# Patient Record
Sex: Female | Born: 1940 | Hispanic: No | Smoking: Never smoker
Health system: Southern US, Community
[De-identification: ages and names within clinical notes are randomized; demographics above are authoritative.]

## PROBLEM LIST (undated history)

## (undated) DIAGNOSIS — K219 Gastro-esophageal reflux disease without esophagitis: Secondary | ICD-10-CM

## (undated) DIAGNOSIS — M1990 Unspecified osteoarthritis, unspecified site: Secondary | ICD-10-CM

## (undated) DIAGNOSIS — K269 Duodenal ulcer, unspecified as acute or chronic, without hemorrhage or perforation: Secondary | ICD-10-CM

## (undated) DIAGNOSIS — N3281 Overactive bladder: Secondary | ICD-10-CM

## (undated) DIAGNOSIS — M7632 Iliotibial band syndrome, left leg: Secondary | ICD-10-CM

## (undated) DIAGNOSIS — J4540 Moderate persistent asthma, uncomplicated: Secondary | ICD-10-CM

## (undated) DIAGNOSIS — N183 Chronic kidney disease, stage 3 unspecified: Secondary | ICD-10-CM

## (undated) DIAGNOSIS — J45909 Unspecified asthma, uncomplicated: Secondary | ICD-10-CM

## (undated) DIAGNOSIS — G43909 Migraine, unspecified, not intractable, without status migrainosus: Secondary | ICD-10-CM

## (undated) DIAGNOSIS — Z9289 Personal history of other medical treatment: Secondary | ICD-10-CM

## (undated) DIAGNOSIS — E785 Hyperlipidemia, unspecified: Secondary | ICD-10-CM

## (undated) DIAGNOSIS — M25562 Pain in left knee: Secondary | ICD-10-CM

## (undated) DIAGNOSIS — J3089 Other allergic rhinitis: Secondary | ICD-10-CM

## (undated) DIAGNOSIS — J309 Allergic rhinitis, unspecified: Secondary | ICD-10-CM

## (undated) HISTORY — PX: JOINT REPLACEMENT: SHX530

## (undated) HISTORY — PX: REPLACEMENT TOTAL KNEE: SUR1224

## (undated) HISTORY — PX: BREAST BIOPSY: SHX20

## (undated) HISTORY — DX: Chronic kidney disease, stage 3 unspecified: N18.30

## (undated) HISTORY — PX: TONSILLECTOMY AND ADENOIDECTOMY: SUR1326

---
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
## 2001-06-26 HISTORY — PX: CATARACT EXTRACTION W/ INTRAOCULAR LENS  IMPLANT, BILATERAL: SHX1307

## 2003-09-25 HISTORY — PX: DILATION AND CURETTAGE OF UTERUS: SHX78

## 2013-06-26 HISTORY — PX: CHEST TUBE INSERTION: SHX231

## 2013-07-13 HISTORY — PX: VIDEO ASSISTED THORACOSCOPY (VATS)/THOROCOTOMY: SHX6173

## 2014-09-23 DIAGNOSIS — G43909 Migraine, unspecified, not intractable, without status migrainosus: Secondary | ICD-10-CM | POA: Diagnosis not present

## 2014-09-23 DIAGNOSIS — K219 Gastro-esophageal reflux disease without esophagitis: Secondary | ICD-10-CM | POA: Diagnosis not present

## 2014-09-23 DIAGNOSIS — N183 Chronic kidney disease, stage 3 (moderate): Secondary | ICD-10-CM | POA: Diagnosis not present

## 2014-09-23 DIAGNOSIS — J45909 Unspecified asthma, uncomplicated: Secondary | ICD-10-CM | POA: Diagnosis not present

## 2014-09-23 DIAGNOSIS — E785 Hyperlipidemia, unspecified: Secondary | ICD-10-CM | POA: Diagnosis not present

## 2014-09-29 DIAGNOSIS — G43909 Migraine, unspecified, not intractable, without status migrainosus: Secondary | ICD-10-CM | POA: Diagnosis not present

## 2014-10-01 DIAGNOSIS — M25562 Pain in left knee: Secondary | ICD-10-CM

## 2014-10-01 DIAGNOSIS — M179 Osteoarthritis of knee, unspecified: Secondary | ICD-10-CM | POA: Diagnosis not present

## 2014-10-01 DIAGNOSIS — M1712 Unilateral primary osteoarthritis, left knee: Secondary | ICD-10-CM | POA: Diagnosis not present

## 2014-10-01 HISTORY — DX: Pain in left knee: M25.562

## 2014-11-10 DIAGNOSIS — Z1231 Encounter for screening mammogram for malignant neoplasm of breast: Secondary | ICD-10-CM | POA: Diagnosis not present

## 2014-12-03 DIAGNOSIS — M1712 Unilateral primary osteoarthritis, left knee: Secondary | ICD-10-CM

## 2014-12-03 HISTORY — DX: Unilateral primary osteoarthritis, left knee: M17.12

## 2014-12-08 ENCOUNTER — Other Ambulatory Visit: Payer: Self-pay | Admitting: Orthopedic Surgery

## 2014-12-08 DIAGNOSIS — Z01818 Encounter for other preprocedural examination: Secondary | ICD-10-CM

## 2014-12-10 ENCOUNTER — Ambulatory Visit
Admission: RE | Admit: 2014-12-10 | Discharge: 2014-12-10 | Disposition: A | Discharge status: Home / Self Care | Payer: Medicare Other | Source: Ambulatory Visit | Attending: Orthopedic Surgery | Admitting: Orthopedic Surgery

## 2014-12-10 DIAGNOSIS — Z01818 Encounter for other preprocedural examination: Secondary | ICD-10-CM | POA: Diagnosis not present

## 2014-12-10 DIAGNOSIS — M1712 Unilateral primary osteoarthritis, left knee: Secondary | ICD-10-CM | POA: Diagnosis not present

## 2014-12-21 ENCOUNTER — Other Ambulatory Visit: Payer: Self-pay | Admitting: Orthopedic Surgery

## 2014-12-31 NOTE — Pre-Procedure Instructions (Signed)
Merita NortonGail D Borg  12/31/2014     Your procedure is scheduled on: Monday January 11, 2015 at 8:45 AM.  Report to [facility] Admitting at 6:45 A.M.  Call this number if you have problems the morning of surgery: [phone]    Remember:  Do not eat food or drink liquids after midnight.  Take these medicines the morning of surgery with A SIP OF WATER : Pulmicort inhaler, Flonase nasal spray, Loratadine (Claritin), Omeprazole (Prilosec), Propranolol (Inderal), and Solifenacin (Vesicare)   Please stop taking any vitamins, herbal medications, Ibuprofen, Advil, Motrin, Black Cohosh, etc on Monday July 11th   Do not wear jewelry, make-up or nail polish.  Do not wear lotions, powders, or perfumes.    Do not shave 48 hours prior to surgery.    Do not bring valuables to the hospital.  [facility] is not responsible for any belongings or valuables.  Contacts, dentures or bridgework may not be worn into surgery.  Leave your suitcase in the car.  After surgery it may be brought to your room.  For patients admitted to the hospital, discharge time will be determined by your treatment team.  Patients discharged the day of surgery will not be allowed to drive home.   Name and phone number of your driver:    Special instructions:  Shower using CHG soap the night before and the morning of your surgery  Please read over the following fact sheets that you were given. Pain Booklet, Coughing and Deep Breathing, Total Joint Packet, MRSA Information and Surgical Site Infection Prevention

## 2015-01-01 ENCOUNTER — Encounter (HOSPITAL_COMMUNITY)
Admission: RE | Admit: 2015-01-01 | Discharge: 2015-01-01 | Disposition: A | Discharge status: Home / Self Care | Payer: Medicare Other | Source: Ambulatory Visit | Attending: Orthopedic Surgery | Admitting: Orthopedic Surgery

## 2015-01-01 ENCOUNTER — Encounter (HOSPITAL_COMMUNITY): Payer: Self-pay

## 2015-01-01 DIAGNOSIS — Z01818 Encounter for other preprocedural examination: Secondary | ICD-10-CM

## 2015-01-01 DIAGNOSIS — J45909 Unspecified asthma, uncomplicated: Secondary | ICD-10-CM | POA: Insufficient documentation

## 2015-01-01 DIAGNOSIS — R001 Bradycardia, unspecified: Secondary | ICD-10-CM | POA: Insufficient documentation

## 2015-01-01 DIAGNOSIS — Z01812 Encounter for preprocedural laboratory examination: Secondary | ICD-10-CM | POA: Insufficient documentation

## 2015-01-01 DIAGNOSIS — K219 Gastro-esophageal reflux disease without esophagitis: Secondary | ICD-10-CM | POA: Diagnosis not present

## 2015-01-01 DIAGNOSIS — M1712 Unilateral primary osteoarthritis, left knee: Secondary | ICD-10-CM | POA: Diagnosis not present

## 2015-01-01 HISTORY — DX: Gastro-esophageal reflux disease without esophagitis: K21.9

## 2015-01-01 HISTORY — DX: Overactive bladder: N32.81

## 2015-01-01 HISTORY — DX: Other allergic rhinitis: J30.89

## 2015-01-01 HISTORY — DX: Unspecified asthma, uncomplicated: J45.909

## 2015-01-01 HISTORY — DX: Migraine, unspecified, not intractable, without status migrainosus: G43.909

## 2015-01-01 HISTORY — DX: Unspecified osteoarthritis, unspecified site: M19.90

## 2015-01-01 LAB — COMPREHENSIVE METABOLIC PANEL
ALT: 22 U/L (ref 14–54)
AST: 26 U/L (ref 15–41)
Albumin: 4.2 g/dL (ref 3.5–5.0)
Alkaline Phosphatase: 91 U/L (ref 38–126)
Anion gap: 10 (ref 5–15)
BILIRUBIN TOTAL: 0.1 mg/dL — AB (ref 0.3–1.2)
BUN: 27 mg/dL — AB (ref 6–20)
CHLORIDE: 108 mmol/L (ref 101–111)
CO2: 22 mmol/L (ref 22–32)
CREATININE: 1.16 mg/dL — AB (ref 0.44–1.00)
Calcium: 10.1 mg/dL (ref 8.9–10.3)
GFR calc Af Amer: 53 mL/min — ABNORMAL LOW (ref >60)
GFR calc non Af Amer: 46 mL/min — ABNORMAL LOW (ref >60)
GLUCOSE: 115 mg/dL — AB (ref 65–99)
Potassium: 4.2 mmol/L (ref 3.5–5.1)
Sodium: 140 mmol/L (ref 135–145)
Total Protein: 7.5 g/dL (ref 6.5–8.1)

## 2015-01-01 LAB — CBC WITH DIFFERENTIAL/PLATELET
Basophils Absolute: 0.1 10*3/uL (ref 0.0–0.1)
Basophils Relative: 1 % (ref 0–1)
EOS ABS: 0.2 10*3/uL (ref 0.0–0.7)
Eosinophils Relative: 2 % (ref 0–5)
HEMATOCRIT: 38.3 % (ref 36.0–46.0)
Hemoglobin: 13.2 g/dL (ref 12.0–15.0)
Lymphocytes Relative: 30 % (ref 12–46)
Lymphs Abs: 2.7 10*3/uL (ref 0.7–4.0)
MCH: 30.8 pg (ref 26.0–34.0)
MCHC: 34.5 g/dL (ref 30.0–36.0)
MCV: 89.3 fL (ref 78.0–100.0)
MONO ABS: 0.5 10*3/uL (ref 0.1–1.0)
MONOS PCT: 6 % (ref 3–12)
NEUTROS ABS: 5.6 10*3/uL (ref 1.7–7.7)
Neutrophils Relative %: 61 % (ref 43–77)
Platelets: 334 10*3/uL (ref 150–400)
RBC: 4.29 MIL/uL (ref 3.87–5.11)
RDW: 12.8 % (ref 11.5–15.5)
WBC: 9.1 10*3/uL (ref 4.0–10.5)

## 2015-01-01 LAB — URINALYSIS, ROUTINE W REFLEX MICROSCOPIC
Bilirubin Urine: NEGATIVE
GLUCOSE, UA: NEGATIVE mg/dL
Hgb urine dipstick: NEGATIVE
KETONES UR: NEGATIVE mg/dL
LEUKOCYTES UA: NEGATIVE
NITRITE: NEGATIVE
PH: 5 (ref 5.0–8.0)
Protein, ur: NEGATIVE mg/dL
Specific Gravity, Urine: 1.015 (ref 1.005–1.030)
Urobilinogen, UA: 0.2 mg/dL (ref 0.0–1.0)

## 2015-01-01 LAB — PROTIME-INR
INR: 1.02 (ref 0.00–1.49)
Prothrombin Time: 13.6 seconds (ref 11.6–15.2)

## 2015-01-01 LAB — SURGICAL PCR SCREEN
MRSA, PCR: NEGATIVE
STAPHYLOCOCCUS AUREUS: POSITIVE — AB

## 2015-01-01 LAB — APTT: aPTT: 28 seconds (ref 24–37)

## 2015-01-01 NOTE — Progress Notes (Signed)
PCP is Brent BullaLawrence [name]. Patient informed Nurse that she had a stress test at [facility] in [place], within the last few years only because her physician wanted to get a "baseline." Patient denied having any cardiac issues, but informed Nurse that she has asthma and uses Pulmicort inhaler twice a day. Patient stated she takes Propranolol for migraines and not for any cardiac issues.  While reviewing health history patient informed Nurse that she had some type of surgery in January 2015 at [facility] on her chest by Dr. Hardin Negusichard Evans and she had two chest tubes inserted. Will request records from [facility]. Patient denied having any acute shortness of breath.   Patient allergy physician is Laurette Schimkeric Kozlow [phone]) also in [place], [place].

## 2015-01-01 NOTE — Progress Notes (Signed)
I called a prescription for Mupirocin ointment to [facility], [address], [place], [place].

## 2015-01-02 LAB — URINE CULTURE

## 2015-01-05 NOTE — Progress Notes (Signed)
Anesthesia Chart Review:  Pt is 74 year old female scheduled for L total knee arthroplasty on 01/11/2015 with Dr. Sherlean FootLucey.   PMH includes: asthma, GERD, migraines. Never smoker. BMI 33.5.   Hospitalized 1/14-1/22/2015 for bilateral pneumonia with bilateral pleural effusions. Thoracentesis attempted, pt found to have empyema. Underwent video assisted thoracoscopy and decortication with placement of bilateral thoracotomy tubes 07/11/13.   Medications include: pulmicort, prilosec, propranolol (for migraines), simvastatin.   Preoperative labs reviewed.    Chest x-ray 01/01/2015 reviewed. No active cardiopulmonary disease.   EKG 01/01/2015: Sinus bradycardia (59 bpm). Low voltage QRS. Nonspecific ST abnormality  By notes, pt had carotid duplex US 06/08/14 at [facility] that showed tortuous and ectatic R brachiocephalic vessels but no aneurysmal dilatation or significant atherosclerotic plaque  Stress echo 07/17/2011: -normal dobutamine stress echo -there is no cavity dilatation noted on stress images -normal LV systolic function  If no changes, I anticipate pt can proceed with surgery as scheduled.   Rica Mastngela Kabbe, FNP-BC [facility]/Anesthesiology Phone: [phone] 01/05/2015 3:08 PM

## 2015-01-10 MED ORDER — SODIUM CHLORIDE 0.9 % IV SOLN
1000.0000 mg | INTRAVENOUS | Status: AC
  Administered 2015-01-11: 1000 mg via INTRAVENOUS
  Filled 2015-01-10: qty 10

## 2015-01-11 ENCOUNTER — Inpatient Hospital Stay (HOSPITAL_COMMUNITY)
Admission: RE | Admit: 2015-01-11 | Discharge: 2015-01-13 | DRG: 470 | Disposition: A | Discharge status: Home Health | Payer: Medicare Other | Source: Ambulatory Visit | Attending: Orthopedic Surgery | Admitting: Orthopedic Surgery

## 2015-01-11 ENCOUNTER — Inpatient Hospital Stay (HOSPITAL_COMMUNITY): Payer: Medicare Other | Admitting: Anesthesiology

## 2015-01-11 ENCOUNTER — Inpatient Hospital Stay (HOSPITAL_COMMUNITY): Payer: Medicare Other | Admitting: Emergency Medicine

## 2015-01-11 ENCOUNTER — Encounter (HOSPITAL_COMMUNITY)
Admission: RE | Disposition: A | Discharge status: Home Health | Payer: Self-pay | Source: Ambulatory Visit | Attending: Orthopedic Surgery

## 2015-01-11 ENCOUNTER — Encounter (HOSPITAL_COMMUNITY): Payer: Self-pay | Admitting: *Deleted

## 2015-01-11 DIAGNOSIS — Z881 Allergy status to other antibiotic agents status: Secondary | ICD-10-CM | POA: Diagnosis not present

## 2015-01-11 DIAGNOSIS — K219 Gastro-esophageal reflux disease without esophagitis: Secondary | ICD-10-CM | POA: Diagnosis not present

## 2015-01-11 DIAGNOSIS — J302 Other seasonal allergic rhinitis: Secondary | ICD-10-CM | POA: Diagnosis present

## 2015-01-11 DIAGNOSIS — Z96659 Presence of unspecified artificial knee joint: Secondary | ICD-10-CM

## 2015-01-11 DIAGNOSIS — G43909 Migraine, unspecified, not intractable, without status migrainosus: Secondary | ICD-10-CM | POA: Diagnosis not present

## 2015-01-11 DIAGNOSIS — Z887 Allergy status to serum and vaccine status: Secondary | ICD-10-CM

## 2015-01-11 DIAGNOSIS — N3281 Overactive bladder: Secondary | ICD-10-CM | POA: Diagnosis present

## 2015-01-11 DIAGNOSIS — M25562 Pain in left knee: Secondary | ICD-10-CM | POA: Diagnosis present

## 2015-01-11 DIAGNOSIS — Z91040 Latex allergy status: Secondary | ICD-10-CM | POA: Diagnosis not present

## 2015-01-11 DIAGNOSIS — Z91030 Bee allergy status: Secondary | ICD-10-CM

## 2015-01-11 DIAGNOSIS — Z79899 Other long term (current) drug therapy: Secondary | ICD-10-CM | POA: Diagnosis not present

## 2015-01-11 DIAGNOSIS — D62 Acute posthemorrhagic anemia: Secondary | ICD-10-CM | POA: Diagnosis not present

## 2015-01-11 DIAGNOSIS — M1712 Unilateral primary osteoarthritis, left knee: Secondary | ICD-10-CM | POA: Diagnosis not present

## 2015-01-11 DIAGNOSIS — Z880 Allergy status to penicillin: Secondary | ICD-10-CM

## 2015-01-11 DIAGNOSIS — J45909 Unspecified asthma, uncomplicated: Secondary | ICD-10-CM | POA: Diagnosis not present

## 2015-01-11 DIAGNOSIS — Z7952 Long term (current) use of systemic steroids: Secondary | ICD-10-CM | POA: Diagnosis not present

## 2015-01-11 DIAGNOSIS — M179 Osteoarthritis of knee, unspecified: Secondary | ICD-10-CM | POA: Diagnosis not present

## 2015-01-11 HISTORY — DX: Presence of unspecified artificial knee joint: Z96.659

## 2015-01-11 HISTORY — PX: TOTAL KNEE ARTHROPLASTY: SHX125

## 2015-01-11 HISTORY — DX: Personal history of other medical treatment: Z92.89

## 2015-01-11 LAB — CBC
HEMATOCRIT: 32.3 % — AB (ref 36.0–46.0)
Hemoglobin: 10.9 g/dL — ABNORMAL LOW (ref 12.0–15.0)
MCH: 30.3 pg (ref 26.0–34.0)
MCHC: 33.7 g/dL (ref 30.0–36.0)
MCV: 89.7 fL (ref 78.0–100.0)
Platelets: 255 10*3/uL (ref 150–400)
RBC: 3.6 MIL/uL — ABNORMAL LOW (ref 3.87–5.11)
RDW: 12.9 % (ref 11.5–15.5)
WBC: 9.1 10*3/uL (ref 4.0–10.5)

## 2015-01-11 LAB — CREATININE, SERUM: Creatinine, Ser: 0.82 mg/dL (ref 0.44–1.00)

## 2015-01-11 SURGERY — ARTHROPLASTY, KNEE, TOTAL
Anesthesia: Monitor Anesthesia Care | Site: Knee | Laterality: Left

## 2015-01-11 MED ORDER — ONDANSETRON HCL 4 MG/2ML IJ SOLN
4.0000 mg | Freq: Four times a day (QID) | INTRAMUSCULAR | Status: DC | PRN
  Administered 2015-01-11: 4 mg via INTRAVENOUS
  Filled 2015-01-11: qty 2

## 2015-01-11 MED ORDER — DIPHENHYDRAMINE HCL 12.5 MG/5ML PO ELIX: 12.5000 mg | ORAL_SOLUTION | ORAL | Status: DC | PRN

## 2015-01-11 MED ORDER — ENOXAPARIN SODIUM 30 MG/0.3ML ~~LOC~~ SOLN
30.0000 mg | Freq: Two times a day (BID) | SUBCUTANEOUS | Status: DC
  Administered 2015-01-12 – 2015-01-13 (×3): 30 mg via SUBCUTANEOUS
  Filled 2015-01-11 (×3): qty 0.3

## 2015-01-11 MED ORDER — SCOPOLAMINE 1 MG/3DAYS TD PT72
1.0000 | MEDICATED_PATCH | TRANSDERMAL | Status: DC
Start: 2015-01-11 — End: 2015-01-13
  Administered 2015-01-11: 1.5 mg via TRANSDERMAL
  Filled 2015-01-11: qty 1

## 2015-01-11 MED ORDER — ROCURONIUM BROMIDE 50 MG/5ML IV SOLN
INTRAVENOUS | Status: AC
  Filled 2015-01-11: qty 1

## 2015-01-11 MED ORDER — BUPIVACAINE LIPOSOME 1.3 % IJ SUSP
20.0000 mL | Freq: Once | INTRAMUSCULAR | Status: AC
  Administered 2015-01-11: 20 mL
  Filled 2015-01-11: qty 20

## 2015-01-11 MED ORDER — OXYCODONE HCL ER 10 MG PO T12A
10.0000 mg | EXTENDED_RELEASE_TABLET | Freq: Two times a day (BID) | ORAL | Status: DC
  Administered 2015-01-13: 10 mg via ORAL
  Filled 2015-01-11: qty 1

## 2015-01-11 MED ORDER — CELECOXIB 200 MG PO CAPS
200.0000 mg | ORAL_CAPSULE | Freq: Two times a day (BID) | ORAL | Status: DC
  Administered 2015-01-11 – 2015-01-13 (×4): 200 mg via ORAL
  Filled 2015-01-11 (×4): qty 1

## 2015-01-11 MED ORDER — LIDOCAINE HCL (CARDIAC) 20 MG/ML IV SOLN
INTRAVENOUS | Status: DC | PRN
  Administered 2015-01-11: 50 mg via INTRAVENOUS

## 2015-01-11 MED ORDER — BUPIVACAINE-EPINEPHRINE (PF) 0.5% -1:200000 IJ SOLN
INTRAMUSCULAR | Status: AC
  Filled 2015-01-11: qty 30

## 2015-01-11 MED ORDER — MENTHOL 3 MG MT LOZG: 1.0000 | LOZENGE | OROMUCOSAL | Status: DC | PRN

## 2015-01-11 MED ORDER — SENNOSIDES-DOCUSATE SODIUM 8.6-50 MG PO TABS: 1.0000 | ORAL_TABLET | Freq: Every evening | ORAL | Status: DC | PRN

## 2015-01-11 MED ORDER — SODIUM CHLORIDE 0.9 % IJ SOLN
INTRAMUSCULAR | Status: AC
  Filled 2015-01-11: qty 10

## 2015-01-11 MED ORDER — LIDOCAINE HCL (CARDIAC) 20 MG/ML IV SOLN
INTRAVENOUS | Status: AC
  Filled 2015-01-11: qty 5

## 2015-01-11 MED ORDER — DEXTROSE 5 % IV SOLN
500.0000 mg | Freq: Four times a day (QID) | INTRAVENOUS | Status: DC | PRN
  Filled 2015-01-11: qty 5

## 2015-01-11 MED ORDER — ONDANSETRON HCL 4 MG/2ML IJ SOLN
INTRAMUSCULAR | Status: DC | PRN
  Administered 2015-01-11: 4 mg via INTRAVENOUS

## 2015-01-11 MED ORDER — ZOLPIDEM TARTRATE 5 MG PO TABS: 5.0000 mg | ORAL_TABLET | Freq: Every evening | ORAL | Status: DC | PRN

## 2015-01-11 MED ORDER — PROPOFOL 10 MG/ML IV BOLUS
INTRAVENOUS | Status: AC
  Filled 2015-01-11: qty 20

## 2015-01-11 MED ORDER — ONDANSETRON HCL 4 MG/2ML IJ SOLN
INTRAMUSCULAR | Status: AC
  Filled 2015-01-11: qty 2

## 2015-01-11 MED ORDER — MIDAZOLAM HCL 2 MG/2ML IJ SOLN
INTRAMUSCULAR | Status: AC
  Administered 2015-01-11 (×2): 1 mg via INTRAVENOUS
  Filled 2015-01-11: qty 2

## 2015-01-11 MED ORDER — PROPRANOLOL HCL 40 MG PO TABS
40.0000 mg | ORAL_TABLET | Freq: Every day | ORAL | Status: DC
  Administered 2015-01-12 – 2015-01-13 (×2): 40 mg via ORAL
  Filled 2015-01-11 (×3): qty 1

## 2015-01-11 MED ORDER — FLEET ENEMA 7-19 GM/118ML RE ENEM
1.0000 | ENEMA | Freq: Once | RECTAL | Status: AC | PRN
Start: 2015-01-11 — End: 2015-01-11

## 2015-01-11 MED ORDER — ACETAMINOPHEN 650 MG RE SUPP: 650.0000 mg | Freq: Four times a day (QID) | RECTAL | Status: DC | PRN

## 2015-01-11 MED ORDER — FENTANYL CITRATE (PF) 100 MCG/2ML IJ SOLN
INTRAMUSCULAR | Status: AC
  Administered 2015-01-11 (×5): 50 ug via INTRAVENOUS
  Filled 2015-01-11: qty 2

## 2015-01-11 MED ORDER — METOCLOPRAMIDE HCL 5 MG PO TABS: 5.0000 mg | ORAL_TABLET | Freq: Three times a day (TID) | ORAL | Status: DC | PRN

## 2015-01-11 MED ORDER — PROMETHAZINE HCL 25 MG/ML IJ SOLN: 6.2500 mg | INTRAMUSCULAR | Status: DC | PRN

## 2015-01-11 MED ORDER — TRANEXAMIC ACID 1000 MG/10ML IV SOLN
1000.0000 mg | Freq: Once | INTRAVENOUS | Status: DC
  Filled 2015-01-11: qty 10

## 2015-01-11 MED ORDER — SODIUM CHLORIDE 0.9 % IV SOLN: INTRAVENOUS | Status: DC

## 2015-01-11 MED ORDER — BUDESONIDE 0.25 MG/2ML IN SUSP
0.2500 mg | Freq: Two times a day (BID) | RESPIRATORY_TRACT | Status: DC
  Administered 2015-01-11 – 2015-01-13 (×4): 0.25 mg via RESPIRATORY_TRACT
  Filled 2015-01-11 (×4): qty 2

## 2015-01-11 MED ORDER — PANTOPRAZOLE SODIUM 40 MG PO TBEC
80.0000 mg | DELAYED_RELEASE_TABLET | Freq: Two times a day (BID) | ORAL | Status: DC
  Administered 2015-01-11 – 2015-01-13 (×4): 80 mg via ORAL
  Filled 2015-01-11 (×4): qty 2

## 2015-01-11 MED ORDER — VANCOMYCIN HCL IN DEXTROSE 1-5 GM/200ML-% IV SOLN
1000.0000 mg | Freq: Two times a day (BID) | INTRAVENOUS | Status: AC
  Administered 2015-01-11: 1000 mg via INTRAVENOUS
  Filled 2015-01-11: qty 200

## 2015-01-11 MED ORDER — HYDROMORPHONE HCL 1 MG/ML IJ SOLN
INTRAMUSCULAR | Status: AC
  Filled 2015-01-11: qty 1

## 2015-01-11 MED ORDER — LACTATED RINGERS IV SOLN
INTRAVENOUS | Status: DC
  Administered 2015-01-11 (×3): via INTRAVENOUS

## 2015-01-11 MED ORDER — SODIUM CHLORIDE 0.9 % IR SOLN
Status: DC | PRN
  Administered 2015-01-11 (×2): 1000 mL

## 2015-01-11 MED ORDER — METHOCARBAMOL 500 MG PO TABS
500.0000 mg | ORAL_TABLET | Freq: Four times a day (QID) | ORAL | Status: DC | PRN
  Administered 2015-01-12 – 2015-01-13 (×4): 500 mg via ORAL
  Filled 2015-01-11 (×4): qty 1

## 2015-01-11 MED ORDER — PROPOFOL INFUSION 10 MG/ML OPTIME
INTRAVENOUS | Status: DC | PRN
  Administered 2015-01-11: 25 ug/kg/min via INTRAVENOUS

## 2015-01-11 MED ORDER — PHENYLEPHRINE HCL 10 MG/ML IJ SOLN
INTRAMUSCULAR | Status: DC | PRN
  Administered 2015-01-11 (×3): 80 ug via INTRAVENOUS

## 2015-01-11 MED ORDER — FENTANYL CITRATE (PF) 250 MCG/5ML IJ SOLN
INTRAMUSCULAR | Status: AC
  Filled 2015-01-11: qty 5

## 2015-01-11 MED ORDER — SODIUM CHLORIDE 0.9 % IV SOLN
INTRAVENOUS | Status: DC
  Administered 2015-01-11: 17:00:00 via INTRAVENOUS

## 2015-01-11 MED ORDER — METOCLOPRAMIDE HCL 5 MG/ML IJ SOLN
5.0000 mg | Freq: Three times a day (TID) | INTRAMUSCULAR | Status: DC | PRN
  Administered 2015-01-11: 10 mg via INTRAVENOUS
  Filled 2015-01-11: qty 2

## 2015-01-11 MED ORDER — CHLORHEXIDINE GLUCONATE 4 % EX LIQD
60.0000 mL | Freq: Once | CUTANEOUS | Status: DC
Start: 2015-01-11 — End: 2015-01-11

## 2015-01-11 MED ORDER — LORATADINE 10 MG PO TABS
10.0000 mg | ORAL_TABLET | Freq: Every day | ORAL | Status: DC
  Administered 2015-01-12 – 2015-01-13 (×2): 10 mg via ORAL
  Filled 2015-01-11 (×2): qty 1

## 2015-01-11 MED ORDER — HYDROMORPHONE HCL 1 MG/ML IJ SOLN
1.0000 mg | INTRAMUSCULAR | Status: DC | PRN
  Administered 2015-01-11 (×2): 1 mg via INTRAVENOUS
  Filled 2015-01-11 (×2): qty 1

## 2015-01-11 MED ORDER — BUPIVACAINE IN DEXTROSE 0.75-8.25 % IT SOLN
INTRATHECAL | Status: DC | PRN
  Administered 2015-01-11: 15 mg via INTRATHECAL

## 2015-01-11 MED ORDER — ACETAMINOPHEN 325 MG PO TABS: 650.0000 mg | ORAL_TABLET | Freq: Four times a day (QID) | ORAL | Status: DC | PRN

## 2015-01-11 MED ORDER — PHENYLEPHRINE 40 MCG/ML (10ML) SYRINGE FOR IV PUSH (FOR BLOOD PRESSURE SUPPORT)
PREFILLED_SYRINGE | INTRAVENOUS | Status: AC
  Filled 2015-01-11: qty 10

## 2015-01-11 MED ORDER — SIMVASTATIN 40 MG PO TABS
40.0000 mg | ORAL_TABLET | Freq: Every day | ORAL | Status: DC
  Administered 2015-01-12 – 2015-01-13 (×2): 40 mg via ORAL
  Filled 2015-01-11 (×2): qty 1

## 2015-01-11 MED ORDER — PANTOPRAZOLE SODIUM 40 MG PO TBEC: 80.0000 mg | DELAYED_RELEASE_TABLET | Freq: Every day | ORAL | Status: DC

## 2015-01-11 MED ORDER — PHENOL 1.4 % MT LIQD: 1.0000 | OROMUCOSAL | Status: DC | PRN

## 2015-01-11 MED ORDER — CHLORHEXIDINE GLUCONATE 4 % EX LIQD: 60.0000 mL | Freq: Once | CUTANEOUS | Status: DC

## 2015-01-11 MED ORDER — BISACODYL 5 MG PO TBEC: 5.0000 mg | DELAYED_RELEASE_TABLET | Freq: Every day | ORAL | Status: DC | PRN

## 2015-01-11 MED ORDER — HYDROMORPHONE HCL 1 MG/ML IJ SOLN
0.2500 mg | INTRAMUSCULAR | Status: DC | PRN
  Administered 2015-01-11: 0.5 mg via INTRAVENOUS

## 2015-01-11 MED ORDER — BUPIVACAINE-EPINEPHRINE 0.5% -1:200000 IJ SOLN
INTRAMUSCULAR | Status: DC | PRN
  Administered 2015-01-11: 30 mL

## 2015-01-11 MED ORDER — MORPHINE SULFATE 2 MG/ML IJ SOLN
2.0000 mg | INTRAMUSCULAR | Status: DC | PRN
  Administered 2015-01-11 – 2015-01-12 (×5): 2 mg via INTRAVENOUS
  Filled 2015-01-11 (×5): qty 1

## 2015-01-11 MED ORDER — DOCUSATE SODIUM 100 MG PO CAPS
100.0000 mg | ORAL_CAPSULE | Freq: Two times a day (BID) | ORAL | Status: DC
  Administered 2015-01-11 – 2015-01-13 (×4): 100 mg via ORAL
  Filled 2015-01-11 (×4): qty 1

## 2015-01-11 MED ORDER — ONDANSETRON HCL 4 MG PO TABS: 4.0000 mg | ORAL_TABLET | Freq: Four times a day (QID) | ORAL | Status: DC | PRN

## 2015-01-11 MED ORDER — MIDAZOLAM HCL 2 MG/2ML IJ SOLN
INTRAMUSCULAR | Status: AC
  Filled 2015-01-11: qty 2

## 2015-01-11 MED ORDER — ALUM & MAG HYDROXIDE-SIMETH 200-200-20 MG/5ML PO SUSP: 30.0000 mL | ORAL | Status: DC | PRN

## 2015-01-11 MED ORDER — CEFAZOLIN SODIUM-DEXTROSE 2-3 GM-% IV SOLR: 2.0000 g | INTRAVENOUS | Status: DC

## 2015-01-11 MED ORDER — EPHEDRINE SULFATE 50 MG/ML IJ SOLN
INTRAMUSCULAR | Status: AC
  Filled 2015-01-11: qty 1

## 2015-01-11 MED ORDER — DARIFENACIN HYDROBROMIDE ER 7.5 MG PO TB24
7.5000 mg | ORAL_TABLET | Freq: Every day | ORAL | Status: DC
  Administered 2015-01-12 – 2015-01-13 (×2): 7.5 mg via ORAL
  Filled 2015-01-11 (×3): qty 1

## 2015-01-11 MED ORDER — VANCOMYCIN HCL IN DEXTROSE 1-5 GM/200ML-% IV SOLN
1000.0000 mg | Freq: Once | INTRAVENOUS | Status: AC
  Administered 2015-01-11: 1000 mg via INTRAVENOUS
  Filled 2015-01-11: qty 200

## 2015-01-11 MED ORDER — OXYCODONE HCL 5 MG PO TABS
5.0000 mg | ORAL_TABLET | ORAL | Status: DC | PRN
  Administered 2015-01-11: 5 mg via ORAL
  Filled 2015-01-11: qty 1

## 2015-01-11 SURGICAL SUPPLY — 60 items
BANDAGE ESMARK 6X9 LF (GAUZE/BANDAGES/DRESSINGS) ×1 IMPLANT
BLADE SAGITTAL 13X1.27X60 (BLADE) ×2 IMPLANT
BLADE SAW SGTL 83.5X18.5 (BLADE) ×2 IMPLANT
BLADE SURG 10 STRL SS (BLADE) ×2 IMPLANT
BLOCK CUTTING FEMUR 2 LT MED (MISCELLANEOUS) ×1 IMPLANT
BLOCK CUTTING FEMUR 3 LT MED (MISCELLANEOUS) ×1 IMPLANT
BNDG CMPR 9X6 STRL LF SNTH (GAUZE/BANDAGES/DRESSINGS) ×1
BNDG ESMARK 6X9 LF (GAUZE/BANDAGES/DRESSINGS) ×2
BOWL SMART MIX CTS (DISPOSABLE) ×2 IMPLANT
CAPT KNEE TOTAL 3 ×1 IMPLANT
CEMENT BONE SIMPLEX SPEEDSET (Cement) ×3 IMPLANT
COVER SURGICAL LIGHT HANDLE (MISCELLANEOUS) ×2 IMPLANT
CUFF TOURNIQUET SINGLE 34IN LL (TOURNIQUET CUFF) ×2 IMPLANT
DRAPE EXTREMITY T 121X128X90 (DRAPE) ×2 IMPLANT
DRAPE IMP U-DRAPE 54X76 (DRAPES) ×2 IMPLANT
DRAPE INCISE IOBAN 66X45 STRL (DRAPES) ×4 IMPLANT
DRAPE PROXIMA HALF (DRAPES) IMPLANT
DRAPE U-SHAPE 47X51 STRL (DRAPES) ×2 IMPLANT
DRSG ADAPTIC 3X8 NADH LF (GAUZE/BANDAGES/DRESSINGS) ×2 IMPLANT
DRSG PAD ABDOMINAL 8X10 ST (GAUZE/BANDAGES/DRESSINGS) ×2 IMPLANT
DURAPREP 26ML APPLICATOR (WOUND CARE) ×4 IMPLANT
ELECT REM PT RETURN 9FT ADLT (ELECTROSURGICAL) ×2
ELECTRODE REM PT RTRN 9FT ADLT (ELECTROSURGICAL) ×1 IMPLANT
GAUZE SPONGE 4X4 12PLY STRL (GAUZE/BANDAGES/DRESSINGS) ×2 IMPLANT
GLOVE BIOGEL M 7.0 STRL (GLOVE) IMPLANT
GLOVE BIOGEL PI IND STRL 7.5 (GLOVE) IMPLANT
GLOVE BIOGEL PI IND STRL 8.5 (GLOVE) ×2 IMPLANT
GLOVE BIOGEL PI INDICATOR 7.5 (GLOVE)
GLOVE BIOGEL PI INDICATOR 8.5 (GLOVE) ×2
GLOVE SURG ORTHO 8.0 STRL STRW (GLOVE) ×4 IMPLANT
GOWN STRL REUS W/ TWL LRG LVL3 (GOWN DISPOSABLE) ×1 IMPLANT
GOWN STRL REUS W/ TWL XL LVL3 (GOWN DISPOSABLE) ×2 IMPLANT
GOWN STRL REUS W/TWL LRG LVL3 (GOWN DISPOSABLE) ×2
GOWN STRL REUS W/TWL XL LVL3 (GOWN DISPOSABLE) ×4
HANDPIECE INTERPULSE COAX TIP (DISPOSABLE) ×2
HOOD PEEL AWAY FACE SHEILD DIS (HOOD) ×6 IMPLANT
KIT BASIN OR (CUSTOM PROCEDURE TRAY) ×2 IMPLANT
KIT ROOM TURNOVER OR (KITS) ×2 IMPLANT
MANIFOLD NEPTUNE II (INSTRUMENTS) ×2 IMPLANT
NEEDLE 22X1 1/2 (OR ONLY) (NEEDLE) ×4 IMPLANT
NS IRRIG 1000ML POUR BTL (IV SOLUTION) ×2 IMPLANT
PACK TOTAL JOINT (CUSTOM PROCEDURE TRAY) ×2 IMPLANT
PACK UNIVERSAL I (CUSTOM PROCEDURE TRAY) ×2 IMPLANT
PAD ARMBOARD 7.5X6 YLW CONV (MISCELLANEOUS) ×4 IMPLANT
PADDING CAST COTTON 6X4 STRL (CAST SUPPLIES) ×2 IMPLANT
SET HNDPC FAN SPRY TIP SCT (DISPOSABLE) ×1 IMPLANT
SPONGE GAUZE 4X4 12PLY STER LF (GAUZE/BANDAGES/DRESSINGS) ×1 IMPLANT
STAPLER VISISTAT 35W (STAPLE) ×2 IMPLANT
SUCTION FRAZIER TIP 10 FR DISP (SUCTIONS) ×2 IMPLANT
SUT BONE WAX W31G (SUTURE) ×2 IMPLANT
SUT VIC AB 0 CTB1 27 (SUTURE) ×4 IMPLANT
SUT VIC AB 1 CT1 27 (SUTURE) ×4
SUT VIC AB 1 CT1 27XBRD ANBCTR (SUTURE) ×2 IMPLANT
SUT VIC AB 2-0 CT1 27 (SUTURE) ×4
SUT VIC AB 2-0 CT1 TAPERPNT 27 (SUTURE) ×2 IMPLANT
SYR 20CC LL (SYRINGE) ×4 IMPLANT
TOWEL OR 17X24 6PK STRL BLUE (TOWEL DISPOSABLE) ×2 IMPLANT
TOWEL OR 17X26 10 PK STRL BLUE (TOWEL DISPOSABLE) ×2 IMPLANT
TRAY CATH 16FR W/PLASTIC CATH (SET/KITS/TRAYS/PACK) ×1 IMPLANT
WATER STERILE IRR 1000ML POUR (IV SOLUTION) ×4 IMPLANT

## 2015-01-11 NOTE — Evaluation (Signed)
Physical Therapy Evaluation Patient Details Name: Shannon Martin MRN: [id number] DOB: [birth year] Today's Date: 01/11/2015   History of Present Illness  Lt TKA  Clinical Impression  Pt is s/p TKA resulting in the deficits listed below (see PT Problem List).  Pt will benefit from skilled PT to increase their independence and safety with mobility to allow discharge home. Patient reporting that she will have family and friends for help upon return home.       Follow Up Recommendations Home health PT    Equipment Recommendations  Rolling walker with 5" wheels    Recommendations for Other Services       Precautions / Restrictions Precautions Precautions: Knee;Fall Restrictions Weight Bearing Restrictions: Yes LLE Weight Bearing: Weight bearing as tolerated      Mobility  Bed Mobility Overal bed mobility: Needs Assistance Bed Mobility: Supine to Sit     Supine to sit: Min assist        Transfers Overall transfer level: Needs assistance Equipment used: Rolling walker (2 wheeled) Transfers: Sit to/from Stand Sit to Stand: Min assist            Ambulation/Gait Ambulation/Gait assistance: Min assist Ambulation Distance (Feet): 4 Feet Assistive device: Rolling walker (2 wheeled) Gait Pattern/deviations: Step-to pattern;Decreased step length - left;Decreased weight shift to left   Gait velocity interpretation: Below normal speed for age/gender    Stairs            Wheelchair Mobility    Modified Rankin (Stroke Patients Only)       Balance Overall balance assessment: Needs assistance Sitting-balance support: No upper extremity supported Sitting balance-Leahy Scale: Fair     Standing balance support: Bilateral upper extremity supported Standing balance-Leahy Scale: Poor                               Pertinent Vitals/Pain Pain Assessment: 0-10 Pain Score: 9  Pain Location: Lt knee Pain Descriptors / Indicators: Aching;Stabbing Pain  Intervention(s): Limited activity within patient's tolerance;Monitored during session    Home Living Family/patient expects to be discharged to:: Private residence Living Arrangements: Alone Available Help at Discharge: Family;Friend(s) Type of Home: Apartment Home Access: Stairs to enter Entrance Stairs-Rails: None Entrance Stairs-Number of Steps: 1 Home Layout: One level Home Equipment: Cane - single point      Prior Function Level of Independence: Independent               Hand Dominance        Extremity/Trunk Assessment               Lower Extremity Assessment: LLE deficits/detail   LLE Deficits / Details: unable to perform SLR independently     Communication   Communication: No difficulties  Cognition Arousal/Alertness: Awake/alert Behavior During Therapy: WFL for tasks assessed/performed Overall Cognitive Status: Within Functional Limits for tasks assessed                      General Comments      Exercises        Assessment/Plan    PT Assessment Patient needs continued PT services  PT Diagnosis Difficulty walking;Generalized weakness;Acute pain   PT Problem List Decreased strength;Decreased range of motion;Decreased activity tolerance;Decreased balance;Decreased mobility;Decreased knowledge of use of DME  PT Treatment Interventions DME instruction;Gait training;Stair training;Functional mobility training;Therapeutic activities;Therapeutic exercise;Balance training;Patient/family education   PT Goals (Current goals can be found in the Care Plan section)  Acute Rehab PT Goals Patient Stated Goal: To return home again PT Goal Formulation: With patient Time For Goal Achievement: 01/25/15 Potential to Achieve Goals: Good    Frequency 7X/week   Barriers to discharge        Co-evaluation               End of Session Equipment Utilized During Treatment: Gait belt Activity Tolerance: Patient limited by pain Patient left: in  chair;with call bell/phone within reach;with nursing/sitter in room Nurse Communication: Mobility status         Time: 1610-9604 PT Time Calculation (min) (ACUTE ONLY): 31 min   Charges:   PT Evaluation $Initial PT Evaluation Tier I: 1 Procedure PT Treatments $Therapeutic Activity: 8-22 mins   PT G Codes:        Christiane Ha, PT, CSCS Pager [phone] Office [phone]  01/11/2015, 3:42 PM

## 2015-01-11 NOTE — Progress Notes (Signed)
Utilization review completed.  

## 2015-01-11 NOTE — Progress Notes (Signed)
Orthopedic Tech Progress Note Patient Details:  Shannon NortonGail D Martin [birth year] [id number]  CPM Left Knee CPM Left Knee: On Left Knee Flexion (Degrees): 90 Left Knee Extension (Degrees): 0 Additional Comments: trapeze bar patient helper Viewed order from doctor's order list  Nikki DomCrawford, Marai Teehan 01/11/2015, 11:40 AM

## 2015-01-11 NOTE — Anesthesia Preprocedure Evaluation (Addendum)
Anesthesia Evaluation  Patient identified by MRN, date of birth, ID band Patient awake    Reviewed: Allergy & Precautions, NPO status , Patient's Chart, lab work & pertinent test results  Airway Mallampati: II  TM Distance: >3 FB Neck ROM: Full    Dental  (+) Edentulous Upper, Dental Advisory Given   Pulmonary asthma ,    Pulmonary exam normal       Cardiovascular negative cardio ROS Normal cardiovascular exam    Neuro/Psych  Headaches, negative neurological ROS  negative psych ROS   GI/Hepatic Neg liver ROS, GERD-  ,  Endo/Other  negative endocrine ROS  Renal/GU negative Renal ROS  negative genitourinary   Musculoskeletal  (+) Arthritis -,   Abdominal   Peds negative pediatric ROS (+)  Hematology negative hematology ROS (+)   Anesthesia Other Findings   Reproductive/Obstetrics negative OB ROS                            Anesthesia Physical Anesthesia Plan  ASA: III  Anesthesia Plan: MAC and Spinal   Post-op Pain Management:    Induction: Intravenous  Airway Management Planned: Simple Face Mask  Additional Equipment:   Intra-op Plan:   Post-operative Plan:   Informed Consent: I have reviewed the patients History and Physical, chart, labs and discussed the procedure including the risks, benefits and alternatives for the proposed anesthesia with the patient or authorized representative who has indicated his/her understanding and acceptance.   Dental advisory given  Plan Discussed with: CRNA, Anesthesiologist and Surgeon  Anesthesia Plan Comments:         Anesthesia Quick Evaluation

## 2015-01-11 NOTE — Anesthesia Procedure Notes (Signed)
Spinal Patient location during procedure: OR Start time: 01/11/2015 8:56 AM End time: 01/11/2015 9:05 AM Staffing Anesthesiologist: Heather RobertsSINGER, Manjinder Breau Performed by: anesthesiologist  Preanesthetic Checklist Completed: patient identified, surgical consent, pre-op evaluation, timeout performed, IV checked, risks and benefits discussed and monitors and equipment checked Spinal Block Patient position: sitting Prep: DuraPrep Patient monitoring: cardiac monitor, continuous pulse ox and blood pressure Approach: right paramedian Location: L2-3 Injection technique: single-shot Needle Needle type: Pencan and Quincke  Needle gauge: 24 G Needle length: 9 cm Additional Notes Functioning IV was confirmed and monitors were applied. Sterile prep and drape, including hand hygiene and sterile gloves were used. The patient was positioned and the spine was prepped. The skin was anesthetized with lidocaine.  Free flow of clear CSF was obtained prior to injecting local anesthetic into the CSF.  The spinal needle aspirated freely following injection.  The needle was carefully withdrawn.  The patient tolerated the procedure well.

## 2015-01-11 NOTE — H&P (Signed)
Shannon Martin MRN:  [id number] DOB/SEX:  14-Aug-1940/female  CHIEF COMPLAINT:  Painful left Knee  HISTORY: Patient is a 74 y.o. female presented with a history of pain in the left knee. Onset of symptoms was gradual starting several years ago with gradually worsening course since that time. Prior procedures on the knee include arthroscopy. Patient has been treated conservatively with over-the-counter NSAIDs and activity modification. Patient currently rates pain in the knee at 10 out of 10 with activity. There is pain at night.  PAST MEDICAL HISTORY: There are no active problems to display for this patient.  Past Medical History  Diagnosis Date  . Migraines     Takes Propranolol  . Asthma   . Overactive bladder     Takes Vesicare  . GERD (gastroesophageal reflux disease)   . Arthritis   . Environmental and seasonal allergies    Past Surgical History  Procedure Laterality Date  . Tonsillectomy      tonsils and adenoids removed X 2 at age 89 and 89  . Chest tube insertion  January 2015    X 2 at [facility]  . Video assisted thoracoscopy (vats)/thorocotomy Left 07/13/2013    VATS with insertion of chest tubes     MEDICATIONS:   Prescriptions prior to admission  Medication Sig Dispense Refill Last Dose  . Aspirin-Acetaminophen-Caffeine (EXCEDRIN MIGRAINE PO) Take 2 tablets by mouth daily as needed (for headaches).     . Black Cohosh 540 MG CAPS Take 540 mg by mouth 2 (two) times daily.     . budesonide (PULMICORT FLEXHALER) 180 MCG/ACT inhaler Inhale 2 puffs into the lungs 2 (two) times daily.     . Calcium Carb-Cholecalciferol (CALCIUM + D3) 600-200 MG-UNIT TABS Take 1 tablet by mouth 2 (two) times daily.     . fluticasone (FLONASE) 50 MCG/ACT nasal spray Place 1 spray into both nostrils daily.     Marland Kitchen. loratadine (CLARITIN) 10 MG tablet Take 10 mg by mouth daily.     . Multiple Vitamins-Minerals (MULTIVITAMIN PO) Take 1 tablet by mouth daily.     Marland Kitchen. omeprazole (PRILOSEC) 40 MG  capsule Take 40 mg by mouth 2 (two) times daily.     Marland Kitchen. OVER THE COUNTER MEDICATION Place 1 drop into both eyes daily. "[employer] Brand Dry Eyes"     . propranolol (INDERAL) 40 MG tablet Take 40 mg by mouth daily.     . simvastatin (ZOCOR) 40 MG tablet Take 40 mg by mouth daily.     . solifenacin (VESICARE) 5 MG tablet Take 5 mg by mouth daily.       ALLERGIES:   Allergies  Allergen Reactions  . Tetanus Toxoids Swelling and Other (See Comments)    Swelling around throat and jaws  . Bee Venom Swelling and Other (See Comments)    Crusty area on skin  . Amoxicillin Other (See Comments)    Upset stomach  . Doxycycline Other (See Comments)    Upset stomach  . Latex Other (See Comments)    Skin will crack open and bleed  . Neomycin Other (See Comments)    Irritates skin  . Penicillins Itching, Swelling and Rash    REVIEW OF SYSTEMS:  A comprehensive review of systems was negative.   FAMILY HISTORY:  No family history on file.  SOCIAL HISTORY:   History  Substance Use Topics  . Smoking status: Never Smoker   . Smokeless tobacco: Not on file  . Alcohol Use: No  EXAMINATION:  Vital signs in last 24 hours:    General appearance: alert, cooperative and no distress Lungs: clear to auscultation bilaterally Heart: regular rate and rhythm, S1, S2 normal, no murmur, click, rub or gallop Abdomen: soft, non-tender; bowel sounds normal; no masses,  no organomegaly Extremities: extremities normal, atraumatic, no cyanosis or edema and Homans sign is negative, no sign of DVT Pulses: 2+ and symmetric Skin: Skin color, texture, turgor normal. No rashes or lesions Neurologic: Alert and oriented X 3, normal strength and tone. Normal symmetric reflexes. Normal coordination and gait  Musculoskeletal:  ROM 0-110, Ligaments intact,  Imaging Review Plain radiographs demonstrate severe degenerative joint disease of the left knee. The overall alignment is significant varus. The bone quality  appears to be good for age and reported activity level.  Assessment/Plan: Primary osteoarthritis, left knee   The patient history, physical examination and imaging studies are consistent with advanced degenerative joint disease of the left knee. The patient has failed conservative treatment.  The clearance notes were reviewed.  After discussion with the patient it was felt that Total Knee Replacement was indicated. The procedure,  risks, and benefits of total knee arthroplasty were presented and reviewed. The risks including but not limited to aseptic loosening, infection, blood clots, vascular injury, stiffness, patella tracking problems complications among others were discussed. The patient acknowledged the explanation, agreed to proceed with the plan.  Khallid Pasillas 01/11/2015, 7:00 AM

## 2015-01-11 NOTE — Transfer of Care (Signed)
Immediate Anesthesia Transfer of Care Note  Patient: Shannon Martin  Procedure(s) Performed: Procedure(s): Left TOTAL KNEE ARTHROPLASTY (Left)  Patient Location: PACU  Anesthesia Type:MAC and Spinal  Level of Consciousness: awake, alert , oriented and patient cooperative  Airway & Oxygen Therapy: Patient Spontanous Breathing  Post-op Assessment: Report given to RN and Post -op Vital signs reviewed and stable  Post vital signs: Reviewed and stable  Last Vitals:  Filed Vitals:   01/11/15 0721  BP: 150/48  Pulse: 51  Temp: 36.4 C  Resp: 20    Complications: No apparent anesthesia complications

## 2015-01-11 NOTE — Anesthesia Postprocedure Evaluation (Signed)
Anesthesia Post Note  Patient: Shannon Martin  Procedure(s) Performed: Procedure(s) (LRB): Left TOTAL KNEE ARTHROPLASTY (Left)  Anesthesia type: MAC/SAB  Patient location: PACU  Post pain: Pain level controlled  Post assessment: Patient's Cardiovascular Status Stable, SAB receding  Last Vitals:  Filed Vitals:   01/11/15 1200  BP:   Pulse: 44  Temp:   Resp: 16    Post vital signs: Reviewed and stable  Level of consciousness: sedated  Complications: No apparent anesthesia complications

## 2015-01-12 ENCOUNTER — Encounter (HOSPITAL_COMMUNITY): Payer: Self-pay | Admitting: General Practice

## 2015-01-12 LAB — BASIC METABOLIC PANEL
ANION GAP: 7 (ref 5–15)
BUN: 13 mg/dL (ref 6–20)
CHLORIDE: 101 mmol/L (ref 101–111)
CO2: 26 mmol/L (ref 22–32)
Calcium: 8.5 mg/dL — ABNORMAL LOW (ref 8.9–10.3)
Creatinine, Ser: 0.73 mg/dL (ref 0.44–1.00)
Glucose, Bld: 125 mg/dL — ABNORMAL HIGH (ref 65–99)
Potassium: 3.5 mmol/L (ref 3.5–5.1)
SODIUM: 134 mmol/L — AB (ref 135–145)

## 2015-01-12 LAB — CBC
HCT: 29.5 % — ABNORMAL LOW (ref 36.0–46.0)
Hemoglobin: 9.9 g/dL — ABNORMAL LOW (ref 12.0–15.0)
MCH: 30.2 pg (ref 26.0–34.0)
MCHC: 33.6 g/dL (ref 30.0–36.0)
MCV: 89.9 fL (ref 78.0–100.0)
PLATELETS: 225 10*3/uL (ref 150–400)
RBC: 3.28 MIL/uL — AB (ref 3.87–5.11)
RDW: 12.7 % (ref 11.5–15.5)
WBC: 8.2 10*3/uL (ref 4.0–10.5)

## 2015-01-12 MED ORDER — HYDROCODONE-ACETAMINOPHEN 5-325 MG PO TABS: 1.0000 | ORAL_TABLET | ORAL | Status: DC | PRN

## 2015-01-12 MED ORDER — ENOXAPARIN SODIUM 40 MG/0.4ML ~~LOC~~ SOLN: 40.0000 mg | SUBCUTANEOUS | Status: DC

## 2015-01-12 MED ORDER — HYDROCODONE-ACETAMINOPHEN 5-325 MG PO TABS
2.0000 | ORAL_TABLET | ORAL | Status: DC | PRN
  Administered 2015-01-12 – 2015-01-13 (×6): 2 via ORAL
  Filled 2015-01-12 (×6): qty 2

## 2015-01-12 MED ORDER — TIZANIDINE HCL 2 MG PO TABS: 2.0000 mg | ORAL_TABLET | Freq: Four times a day (QID) | ORAL | Status: DC | PRN

## 2015-01-12 NOTE — Progress Notes (Signed)
Patient arrived from PACU after surgery complaining of pain.  She was given 1 mg of Dilaudid and 5 mg of Oxycodone.  Within a few minutes, patient was having nausea and vomiting.  This continued for a while, she was given Zofran, Reglan and Scopolamine patch to help with the nausea and vomiting.   At 18:00 patient was complaining of pain, I gave her 1 mg of Dilaudid.  Once I had completed the injection, she began to vomit and having nausea too.  I then realized that it was possible she was having a reaction to the pain medications rather than PONV.  I called the pharmacy to discuss this with the pharmacist and she agreed.  I called Dr. Tobin ChadLucey's office and Dr. Wandra Feinstein. Murphy was on call and gave me an order for Morphine.  I will let the night shift nurse know this situation.  I was told by pharmacy not to give the patient Oxycodone, Oxycontin, ect.; the Oxycontin IR contributed to her reaction of nausea and vomiting.

## 2015-01-12 NOTE — Progress Notes (Signed)
Occupational Therapy Evaluation Patient Details Name: Shannon Martin MRN: [id number] DOB: [birth year] Today's Date: 01/12/2015    History of Present Illness Lt TKA   Clinical Impression   PTA, pt lived alone and was independent with ADL and mobility. Pt with increased difficulty with mobility and pain control this session. Pt experienced LOB x 2 with therapist assisting. Pain is 8/10. Nsg notified. Feel pt would benefit from additional sessions tomorrow to facilitate safe D/C home. Pt in agreement with staying extra night. Discussed recommendation for pt to have closer to 24/7 S initially after D/C. Pt reports this can be arranged. Will see again this pm if able and plan to see again tomorrow.     Follow Up Recommendations  Home health OT;Supervision - Intermittent    Equipment Recommendations  3 in 1 bedside comode    Recommendations for Other Services       Precautions / Restrictions Precautions Precautions: Knee;Fall Precaution Booklet Issued:  Restrictions Weight Bearing Restrictions: Yes LLE Weight Bearing: Weight bearing as tolerated      Mobility Bed Mobility Overal bed mobility: Needs Assistance Bed Mobility: Supine to Sit     Supine to sit: Min assist (assist with managing LLE)        Transfers Overall transfer level: Needs assistance Equipment used: Rolling walker (2 wheeled) Transfers: Sit to/from [employer] Sit to Stand: Min assist         General transfer comment: loss of balance posteriorly and lowered to recliner. Loss of balance in bathroom during hygiene    Balance Overall balance assessment: Needs assistance Sitting-balance support: No upper extremity supported Sitting balance-Leahy Scale: Fair     Standing balance support: Bilateral upper extremity supported Standing balance-Leahy Scale: Poor                              ADL Overall ADL's : Needs assistance/impaired     Grooming: Set up   Upper Body  Bathing: Set up   Lower Body Bathing: Minimal assistance;Sit to/from stand   Upper Body Dressing : Set up   Lower Body Dressing: Moderate assistance;Sit to/from stand   Toilet Transfer: Minimal assistance;Ambulation;BSC;Grab bars;RW;Requires wide/bariatric (toilet)   Toileting- Clothing Manipulation and Hygiene: Minimal assistance;Sit to/from stand       Functional mobility during ADLs: Minimal assistance (loss of balnce during ADL and sit - stnad from chair) General ADL Comments: Began education on compensatory techniques for ADL and funcitonal mobility. Pt lost balance getting up from chair and again when in bathroom adjusting clothing after toileitng. c/o 8/10 pain. Expressed fear of falling. required mod vc for correct sequencing of RW use. Educated pt on need to stay within walker to give support in case L knee buckles.                     Pertinent Vitals/Pain Pain Assessment: 0-10 Pain Score: 8  Pain Location: L knee Pain Descriptors / Indicators: Aching;Burning;Shooting;Stabbing Pain Intervention(s): Limited activity within patient's tolerance;Monitored during session;Repositioned;Ice applied     Hand Dominance     Extremity/Trunk Assessment Upper Extremity Assessment Upper Extremity Assessment: Overall WFL for tasks assessed   Lower Extremity Assessment Lower Extremity Assessment: Defer to PT evaluation LLE Deficits / Details: unable to perform SLR independently   Cervical / Trunk Assessment Cervical / Trunk Assessment: Normal   Communication Communication Communication: No difficulties   Cognition Arousal/Alertness: Awake/alert Behavior During Therapy: WFL for tasks assessed/performed  Overall Cognitive Status: Within Functional Limits for tasks assessed                     General Comments   Pt expressed concerns over pain control and loss of balance this session.                  Home Living Family/patient expects to be discharged to::  Private residence Living Arrangements: Alone Available Help at Discharge: Family;Friend(s) Type of Home: Apartment Home Access: Stairs to enter Entrance Stairs-Number of Steps: 1 Entrance Stairs-Rails: None Home Layout: One level     Bathroom Shower/Tub: IT trainerTub/shower unit;Curtain   Bathroom Toilet: Handicapped height Bathroom Accessibility: Yes How Accessible: Accessible via walker;Accessible via wheelchair Home Equipment: Cane - single point;Grab bars - tub/shower;Grab bars - toilet;Hand held shower head          Prior Functioning/Environment Level of Independence: Independent             OT Diagnosis: Generalized weakness;Acute pain   OT Problem List: Decreased strength;Decreased range of motion;Decreased activity tolerance;Impaired balance (sitting and/or standing);Decreased safety awareness;Decreased knowledge of use of DME or AE;Obesity;Pain   OT Treatment/Interventions: Self-care/ADL training;DME and/or AE instruction;Therapeutic activities;Patient/family education;Balance training    OT Goals(Current goals can be found in the care plan section) Acute Rehab OT Goals Patient Stated Goal: to be safe at home OT Goal Formulation: With patient Time For Goal Achievement: 01/19/15 Potential to Achieve Goals: Good  OT Frequency: Min 2X/week   Barriers to D/C:            Co-evaluation              End of Session Equipment Utilized During Treatment: Gait belt;Rolling walker CPM Left Knee CPM Left Knee: Off Additional Comments:  Nurse Communication: Mobility status  Activity Tolerance: Patient limited by pain Patient left: in bed;with call bell/phone within reach   Time: 1140-1211 OT Time Calculation (min): 31 min Charges:  OT General Charges $OT Visit: 1 Procedure OT Evaluation $Initial OT Evaluation Tier I: 1 Procedure OT Treatments $Self Care/Home Management : 8-22 mins G-Codes:    Konrad Hoak,HILLARY 01/12/2015, 12:42 PM   [facility] Y[facility] Tangeman, OTR/L   [phone] 01/12/2015

## 2015-01-12 NOTE — Care Management Note (Signed)
Case Management Note  Patient Details  Name: Shannon Martin MRN: [id number] Date of Birth: Nov 21, 1940  Subjective/Objective:       S/p left total knee arthroplasty             Action/Plan: [facility] by MD office. Spoke with patient, no change in discharge plan. Patient states that she will have her daughter and a friend available to assist after discharge. [employer] delivered rolling walker and 3N1 to patient's room and will deliver CPM to patient's home. No other discharge needs identified.    Expected Discharge Date:                  Expected Discharge Plan:  Home w Home Health Services  In-House Referral:  NA  Discharge planning Services  CM Consult  Post Acute Care Choice:  Durable Medical Equipment, Home Health Choice offered to:  Patient  DME Arranged:  3-N-1, CPM, Walker rolling DME Agency:  [employer]  HH Arranged:  PT HH [facility]  Status of Service:  Completed, signed off  Medicare Important Message Given:    Date Medicare IM Given:    Medicare IM give by:    Date Additional Medicare IM Given:    Additional Medicare Important Message give by:     If discussed at Long Length of Stay Meetings, dates discussed:    Additional Comments:  Shannon Martin, Shannon Wimbush Watson, RN 01/12/2015, 10:52 AM

## 2015-01-12 NOTE — Progress Notes (Signed)
Occupational Therapy Treatment Patient Details Name: Shannon NortonGail D Cazarez MRN: [id number] DOB: Jun 28, 1940 Today's Date: 01/12/2015    History of present illness Lt TKA   OT comments  Improved performance this pm. Pain appears better controlled. Continues to require vc for safety during ADL tasks. Will plan to see in am to facilitate safe d/C home. Recommend follow up with HHOT as pt lives alone.  Follow Up Recommendations  Home health OT;Supervision - Intermittent    Equipment Recommendations  3 in 1 bedside comode    Recommendations for Other Services      Precautions / Restrictions Precautions Precautions: Knee;Fall Restrictions Weight Bearing Restrictions: Yes LLE Weight Bearing: Weight bearing as tolerated       Mobility Bed Mobility Overal bed mobility: Needs Assistance Bed Mobility: Sit to Supine;Supine to Sit     Supine to sit: Supervision Sit to supine: Supervision   General bed mobility comments: improved performance from earlier session. educated on use of scarf/sheet to help move LLE when fatigued  Transfers Overall transfer level: Needs assistance Equipment used: Rolling walker (2 wheeled) Transfers: Sit to/from [employer];Stand Pivot Transfers Sit to Stand: Supervision Stand pivot transfers: Min guard       General transfer comment: cues for safe hand placement    Balance Overall balance assessment: Needs assistance Sitting-balance support: No upper extremity supported Sitting balance-Leahy Scale: Fair     Standing balance support: During functional activity;Bilateral upper extremity supported Standing balance-Leahy Scale: Fair                     ADL                                       Functional mobility during ADLs: Rolling walker;Cueing for safety;Cueing for sequencing;Minimal assistance General ADL Comments: Completed ADL with pt at sink level. Educated on safety and compensatory techniques for ADL. Educated pt on  importance of always havingRW infront of her and not trying to walk without it (as she did in bathroom), even for a couple of steps. Pt verbalized understanding.       Vision                     Perception     Praxis      Cognition   Behavior During Therapy: WFL for tasks assessed/performed Overall Cognitive Status: Within Functional Limits for tasks assessed                       Extremity/Trunk Assessment               Exercises     Shoulder Instructions       General Comments      Pertinent Vitals/ Pain       Pain Assessment: 0-10 Pain Score: 6  Pain Location: L knee Pain Descriptors / Indicators: Aching;Burning Pain Intervention(s): Limited activity within patient's tolerance;Monitored during session;Repositioned;Ice applied  Home Living Family/patient expects to be discharged to:: Private residence Living Arrangements: Alone                                      Prior Functioning/Environment              Frequency Min 2X/week     Progress Toward Goals  OT Goals(current  goals can now be found in the care plan section)  Progress towards OT goals: Progressing toward goals  Acute Rehab OT Goals Patient Stated Goal: to be safe at home OT Goal Formulation: With patient Time For Goal Achievement: 01/19/15 Potential to Achieve Goals: Good ADL Goals Pt Will Perform Lower Body Bathing: with set-up;with supervision;sit to/from stand;with adaptive equipment Pt Will Perform Lower Body Dressing: with set-up;with supervision;with adaptive equipment;sit to/from stand Pt Will Transfer to Toilet: with modified independence;ambulating;bedside commode Pt Will Perform Toileting - Clothing Manipulation and hygiene: with modified independence;sit to/from stand  Plan Discharge plan remains appropriate    Co-evaluation                 End of Session Equipment Utilized During Treatment: Gait belt;Rolling walker   Activity  Tolerance Patient tolerated treatment well   Patient Left in bed;with call bell/phone within reach   Nurse Communication Mobility status        Time: 5409-8119 OT Time Calculation (min): 26 min  Charges: OT General Charges $OT Visit: 1 Procedure OT Treatments $Self Care/Home Management : 23-37 mins  Teresea Donley,HILLARY 01/12/2015, 4:55 PM   [facility], OTR/L  [phone] 01/12/2015

## 2015-01-12 NOTE — Op Note (Signed)
TOTAL KNEE REPLACEMENT OPERATIVE NOTE:  01/11/2015  5:14 PM  PATIENT:  Shannon Martin  74 y.o. female  PRE-OPERATIVE DIAGNOSIS:  Primary osteoarthritis left knee  POST-OPERATIVE DIAGNOSIS:  primary osteoarthritis left knee  PROCEDURE:  Procedure(s): Left TOTAL KNEE ARTHROPLASTY  SURGEON:  Surgeon(s): Dannielle Huh, MD  PHYSICIAN ASSISTANT: Altamese Cabal, [facility]  ANESTHESIA:   spinal  DRAINS: Hemovac  SPECIMEN: None  COUNTS:  Correct  TOURNIQUET:   Total Tourniquet Time Documented: Thigh (Left) - 61 minutes Total: Thigh (Left) - 61 minutes   DICTATION:  Indication for procedure:    The patient is a 74 y.o. female who has failed conservative treatment for Primary osteoarthritis left knee.  Informed consent was obtained prior to anesthesia. The risks versus benefits of the operation were explain and in a way the patient can, and did, understand.   On the implant demand matching protocol, this patient scored 10.  Therefore, this patient did" "did not receive a polyethylene insert with vitamin E which is a high demand implant.  Description of procedure:     The patient was taken to the operating room and placed under anesthesia.  The patient was positioned in the usual fashion taking care that all body parts were adequately padded and/or protected.  I foley catheter was not placed.  A tourniquet was applied and the leg prepped and draped in the usual sterile fashion.  The extremity was exsanguinated with the esmarch and tourniquet inflated to 350 mmHg.  Pre-operative range of motion was normal.  The knee was in 5 degree of mild varus.  A midline incision approximately 6-7 inches long was made with a #10 blade.  A new blade was used to make a parapatellar arthrotomy going 2-3 cm into the quadriceps tendon, over the patella, and alongside the medial aspect of the patellar tendon.  A synovectomy was then performed with the #10 blade and forceps. I then elevated the deep MCL off the  medial tibial metaphysis subperiosteally around to the semimembranosus attachment.    I everted the patella and used calipers to measure patellar thickness.  I used the reamer to ream down to appropriate thickness to recreate the native thickness.  I then removed excess bone with the rongeur and sagittal saw.  I used the appropriately sized template and drilled the three lug holes.  I then put the trial in place and measured the thickness with the calipers to ensure recreation of the native thickness.  The trial was then removed and the patella subluxed and the knee brought into flexion.  A homan retractor was place to retract and protect the patella and lateral structures.  A Z-retractor was place medially to protect the medial structures.  The extra-medullary alignment system was used to make cut the tibial articular surface perpendicular to the anamotic axis of the tibia and in 3 degrees of posterior slope.  The cut surface and alignment jig was removed.  I then used the intramedullary alignment guide to make a 6 valgus cut on the distal femur.  I then marked out the epicondylar axis on the distal femur.  The Patient Specific Cutting guides from [employer] were used. The 4-In-1 cutting block was screwed into place in external rotation matching the posterior condylar angle, making our cuts perpendicular to the epicondylar axis.  Anterior, posterior and chamfer cuts were made with the sagittal saw.  The cutting block and cut pieces were removed.  A lamina spreader was placed in 90 degrees of flexion.  The  ACL, PCL, menisci, and posterior condylar osteophytes were removed.  A 14 mm spacer blocked was found to offer good flexion and extension gap balance after minimal in degree releasing.   The scoop retractor was then placed and the femoral finishing block was pinned in place.  The small sagittal saw was used as well as the lug drill to finish the femur.  The block and cut surfaces were removed and the  medullary canal hole filled with autograft bone from the cut pieces.  The tibia was delivered forward in deep flexion and external rotation.  A size 3 tray was selected and pinned into place centered on the medial 1/3 of the tibial tubercle.  The reamer and keel was used to prepare the tibia through the tray.    I then trialed with the size 3 femur, size 2 tibia, a 14 mm insert and the 2 patella.  I had excellent flexion/extension gap balance, excellent patella tracking.  Flexion was full and beyond 120 degrees; extension was zero.  These components were chosen and the staff opened them to me on the back table while the knee was lavaged copiously and the cement mixed.  The soft tissue was infiltrated with 60cc of exparel 1.3% through a 21 gauge needle.  I cemented in the components and removed all excess cement.  The polyethylene tibial component was snapped into place and the knee placed in extension while cement was hardening.  The capsule was infilltrated with 30cc of .25% Marcaine with epinephrine.  A hemovac was place in the joint exiting superolaterally.  A pain pump was place superomedially superficial to the arthrotomy.  Once the cement was hard, the tourniquet was let down.  Hemostasis was obtained.  The arthrotomy was closed with figure-8 #1 vicryl sutures.  The deep soft tissues were closed with #0 vicryls and the subcuticular layer closed with a running #2-0 vicryl.  The skin was reapproximated and closed with skin staples.  The wound was dressed with xeroform, 4 x4's, 2 ABD sponges, a single layer of webril and a TED stocking.   The patient was then awakened, extubated, and taken to the recovery room in stable condition.  BLOOD LOSS:  300cc DRAINS: 1 hemovac, 1 pain catheter COMPLICATIONS:  None.  PLAN OF CARE: Admit to inpatient   PATIENT DISPOSITION:  PACU - hemodynamically stable.   Delay start of Pharmacological VTE agent (>24hrs) due to surgical blood loss or risk of bleeding:   not applicable  Please fax a copy of this op note to my office at [phone] (please only include page 1 and 2 of the Case Information op note)

## 2015-01-12 NOTE — Progress Notes (Signed)
SPORTS MEDICINE AND JOINT REPLACEMENT  Shannon SpurlingStephen Lucey, MD   [name], PA-C [address], [place], [place]  [id number]                             [phone]   PROGRESS NOTE  Subjective:  negative for Chest Pain  negative for Shortness of Breath  negative for Nausea/Vomiting   negative for Calf Pain  negative for Bowel Movement   Tolerating Diet: yes         Patient reports pain as 7 on 0-10 scale.    Objective: Vital signs in last 24 hours:   Patient Vitals for the past 24 hrs:  BP Temp Temp src Pulse Resp SpO2  01/12/15 0415 (!) 130/55 mmHg 98 F (36.7 C) Oral 69 - 98 %  01/12/15 0053 120/72 mmHg 98.1 F (36.7 C) Oral 60 - 98 %  01/11/15 2007 (!) 121/54 mmHg 97.1 F (36.2 C) Oral 65 16 100 %    @flow {1959:LAST@   Intake/Output from previous day:   07/18 0701 - 07/19 0700 In: 2497.5 [P.O.:240; I.V.:2057.5] Out: -    Intake/Output this shift:   07/19 0701 - 07/19 1900 In: 240 [P.O.:240] Out: -    Intake/Output      07/18 0701 - 07/19 0700 07/19 0701 - 07/20 0700   P.O. 240 240   I.V. (mL/kg) 2057.5 (24.8)    IV Piggyback 200    Total Intake(mL/kg) 2497.5 (30.1) 240 (2.9)   Net +2497.5 +240        Urine Occurrence 4 x       LABORATORY DATA:  Recent Labs  01/11/15 1424 01/12/15 0445  WBC 9.1 8.2  HGB 10.9* 9.9*  HCT 32.3* 29.5*  PLT 255 225    Recent Labs  01/11/15 1424 01/12/15 0445  NA  --  134*  K  --  3.5  CL  --  101  CO2  --  26  BUN  --  13  CREATININE 0.82 0.73  GLUCOSE  --  125*  CALCIUM  --  8.5*   Lab Results  Component Value Date   INR 1.02 01/01/2015    Examination:  General appearance: alert, cooperative and no distress Extremities: Homans sign is negative, no sign of DVT  Wound Exam: clean, dry, intact   Drainage:  Scant/small amount Serosanguinous exudate  Motor Exam: EHL and FHL Intact  Sensory Exam: Deep Peroneal normal   Assessment:    1 Day Post-Op  Procedure(s) (LRB): Left TOTAL KNEE  ARTHROPLASTY (Left)  ADDITIONAL DIAGNOSIS:  Active Problems:   S/P total knee arthroplasty  Acute Blood Loss Anemia   Plan: Physical Therapy as ordered Weight Bearing as Tolerated (WBAT)  DVT Prophylaxis:  Lovenox  DISCHARGE PLAN: Home  DISCHARGE NEEDS: HHPT, CPM, Walker and 3-in-1 comode seat         Shannon Martin 01/12/2015, 1:22 PM

## 2015-01-12 NOTE — Discharge Instructions (Signed)

## 2015-01-12 NOTE — Progress Notes (Signed)
Physical Therapy Treatment Patient Details Name: Shannon Martin MRN: [id number] DOB: September 06, 1940 Today's Date: 01/12/2015    History of Present Illness Lt TKA    PT Comments    Patient is making progress with PT.  From a mobility standpoint anticipate patient will be ready for DC home following afternoon session. Recommending assistance at home upon D/C for safety with mobility.     Follow Up Recommendations  Home health PT     Equipment Recommendations  Rolling walker with 5" wheels    Recommendations for Other Services       Precautions / Restrictions Precautions Precautions: Knee;Fall Precaution Booklet Issued: Yes (comment) (TKA HEP) Restrictions Weight Bearing Restrictions: Yes LLE Weight Bearing: Weight bearing as tolerated    Mobility  Bed Mobility Overal bed mobility: Needs Assistance Bed Mobility: Supine to Sit     Supine to sit: Min assist        Transfers Overall transfer level: Needs assistance Equipment used: Rolling walker (2 wheeled) Transfers: Sit to/from Stand Sit to Stand: Min guard            Ambulation/Gait Ambulation/Gait assistance: Min guard Ambulation Distance (Feet): 50 Feet Assistive device: Rolling walker (2 wheeled) Gait Pattern/deviations: Decreased step length - left;Decreased weight shift to left   Gait velocity interpretation: Below normal speed for age/gender     Stairs Stairs: Yes Stairs assistance: Min assist Stair Management: No rails;With walker Number of Stairs: 1 General stair comments: reviewed stair sequence, patient verbalized understanding. Denied any questions or concerns after attempt.   Wheelchair Mobility    Modified Rankin (Stroke Patients Only)       Balance Overall balance assessment: Needs assistance Sitting-balance support: No upper extremity supported Sitting balance-Leahy Scale: Fair     Standing balance support: Bilateral upper extremity supported Standing balance-Leahy Scale:  Poor                      Cognition Arousal/Alertness: Awake/alert Behavior During Therapy: WFL for tasks assessed/performed Overall Cognitive Status: Within Functional Limits for tasks assessed                      Exercises Total Joint Exercises Ankle Circles/Pumps: Both;10 reps Quad Sets: Left;5 reps;Supine;Strengthening Towel Squeeze: Both;5 reps;Supine;Strengthening Heel Slides: AAROM;Left;10 reps;Supine (endrange assist) Hip ABduction/ADduction: Left;Strengthening;10 reps;Supine (min assit) Straight Leg Raises: 5 reps;Left;Supine (max assist) Goniometric ROM: 19-51    General Comments        Pertinent Vitals/Pain Pain Assessment: 0-10 Pain Score: 8  Pain Location: lt knee Pain Descriptors / Indicators: Aching;Shooting Pain Intervention(s): Monitored during session;Repositioned    Home Living Family/patient expects to be discharged to:: Private residence Living Arrangements: Alone Available Help at Discharge: Family;Friend(s) Type of Home: Apartment Home Access: Stairs to enter Entrance Stairs-Rails: None Home Layout: One level Home Equipment: Cane - single point;Grab bars - tub/shower;Grab bars - toilet;Hand held shower head      Prior Function Level of Independence: Independent          PT Goals (current goals can now be found in the care plan section) Acute Rehab PT Goals Patient Stated Goal: To return home again PT Goal Formulation: With patient Time For Goal Achievement: 01/25/15 Potential to Achieve Goals: Good Progress towards PT goals: Progressing toward goals    Frequency  7X/week    PT Plan Current plan remains appropriate    Co-evaluation             End of Session  Equipment Utilized During Treatment: Gait belt Activity Tolerance: Patient limited by pain Patient left: in chair;with call bell/phone within reach     Time: 0929-1003 PT Time Calculation (min) (ACUTE ONLY): 34 min  Charges:  $Gait Training: 8-22  mins $Therapeutic Exercise: 8-22 mins                    G Codes:      Christiane Ha, PT, CSCS Pager [phone] Office [phone]  01/12/2015, 11:59 AM

## 2015-01-12 NOTE — Plan of Care (Signed)
Problem: Consults Goal: Diagnosis- Total Joint Replacement Primary Total Knee Left     

## 2015-01-12 NOTE — Progress Notes (Signed)
Physical Therapy Treatment Patient Details Name: Shannon Martin MRN: [id number] DOB: 01-Nov-1940 Today's Date: 01/12/2015    History of Present Illness Lt TKA    PT Comments    Patient reporting that her pain is better controlled this afternoon compared to this morning but still limiting her mobility. Patient also reporting that she is nervous about going home today. Describes nearly falling in bathroom twice earlier today. Patient would benefit from another PT session tomorrow morning to work on mobility before D/C home.   Follow Up Recommendations  Home health PT     Equipment Recommendations  Rolling walker with 5" wheels    Recommendations for Other Services       Precautions / Restrictions Precautions Precautions: Knee;Fall Precaution Booklet Issued: Yes (comment) (TKA HEP) Restrictions Weight Bearing Restrictions: Yes LLE Weight Bearing: Weight bearing as tolerated    Mobility  Bed Mobility Overal bed mobility: Needs Assistance Bed Mobility: Supine to Sit;Sit to Supine     Supine to sit: Supervision Sit to supine: Supervision      Transfers Overall transfer level: Needs assistance Equipment used: Rolling walker (2 wheeled) Transfers: Sit to/from Stand Sit to Stand: Min guard         General transfer comment: cues for using hand placement with sit/stand for safety  Ambulation/Gait Ambulation/Gait assistance: Min guard Ambulation Distance (Feet): 75 Feet (50 feet X1, 25 feet X1 - seated rest between ) Assistive device: Rolling walker (2 wheeled) Gait Pattern/deviations: Step-through pattern;Decreased step length - left;Decreased weight shift to left   Gait velocity interpretation: Below normal speed for age/gender General Gait Details: slow pattern but no gross loss of balance    Wheelchair Mobility    Modified Rankin (Stroke Patients Only)       Balance Overall balance assessment: Needs assistance Sitting-balance support: No upper extremity  supported Sitting balance-Leahy Scale: Fair     Standing balance support: Bilateral upper extremity supported Standing balance-Leahy Scale: Poor                      Cognition Arousal/Alertness: Awake/alert Behavior During Therapy: WFL for tasks assessed/performed Overall Cognitive Status: Within Functional Limits for tasks assessed         General Comments        Pertinent Vitals/Pain Pain Assessment: 0-10 Pain Score: 6  Pain Location: Lt knee and thigh Pain Descriptors / Indicators: Aching;Sore Pain Intervention(s): Monitored during session    Home Living Family/patient expects to be discharged to:: Private residence Living Arrangements: Alone Available Help at Discharge: Family;Friend(s) Type of Home: Apartment Home Access: Stairs to enter Entrance Stairs-Rails: None Home Layout: One level Home Equipment: Cane - single point;Grab bars - tub/shower;Grab bars - toilet;Hand held shower head      Prior Function Level of Independence: Independent          PT Goals (current goals can now be found in the care plan section) Acute Rehab PT Goals Patient Stated Goal: to be safe at home PT Goal Formulation: With patient Time For Goal Achievement: 01/25/15 Potential to Achieve Goals: Good Progress towards PT goals: Progressing toward goals    Frequency  7X/week    PT Plan Current plan remains appropriate    Co-evaluation             End of Session Equipment Utilized During Treatment: Gait belt Activity Tolerance: Patient limited by fatigue;Patient limited by pain Patient left: in bed;with call bell/phone within reach (in bone foam)  Time: [id number] PT Time Calculation (min) (ACUTE ONLY): 28 min  Charges:  $Gait Training: 23-37 mins $Therapeutic Exercise: 8-22 mins                    G Codes:      Christiane HaBenjamin J. Cobe Viney, PT, CSCS Pager [phone] Office [phone]  01/12/2015, 2:47 PM

## 2015-01-13 LAB — CBC
HEMATOCRIT: 28.6 % — AB (ref 36.0–46.0)
Hemoglobin: 9.8 g/dL — ABNORMAL LOW (ref 12.0–15.0)
MCH: 30.9 pg (ref 26.0–34.0)
MCHC: 34.3 g/dL (ref 30.0–36.0)
MCV: 90.2 fL (ref 78.0–100.0)
Platelets: 236 10*3/uL (ref 150–400)
RBC: 3.17 MIL/uL — ABNORMAL LOW (ref 3.87–5.11)
RDW: 12.7 % (ref 11.5–15.5)
WBC: 9.5 10*3/uL (ref 4.0–10.5)

## 2015-01-13 NOTE — Progress Notes (Signed)
Occupational Therapy Treatment Patient Details Name: AVYANA PUFFENBARGER MRN: [id number] DOB: [birth year] Today's Date: 01/13/2015    History of present illness Lt TKA   OT comments  Pt doing well today and no LOB during dynamic balance tasks of reaching and pulling up pants and underwear. She needs some cues for safety with walker and hand placement with functional transfers. She was able to don clothing over LEs without AE use. Recommended pt have initial supervision if possible with bathing/dressing task at home and pt states her neighbor can come and be nearby. Recommend HHOT to followup with reinforcing safety and progressing independence.    Follow Up Recommendations  Home health OT ; initial supervision especially with bathing/dressing is recommended. Pt states friends can check in on her.   Equipment Recommendations  3 in 1 bedside comode (in room)    Recommendations for Other Services      Precautions / Restrictions Precautions Precautions: Knee;Fall Restrictions Weight Bearing Restrictions: Yes LLE Weight Bearing: Weight bearing as tolerated       Mobility Bed Mobility           General bed mobility comments: in chair  Transfers Overall transfer level: Needs assistance Equipment used: Rolling walker (2 wheeled) Transfers: Sit to/from Stand Sit to Stand: Min guard         General transfer comment: cues for safe hand placement    Balance Overall balance assessment: Needs assistance Sitting-balance support: No upper extremity supported Sitting balance-Leahy Scale: Fair     Standing balance support: pt held to walker with one hand to pull up pants and alternated hand hold to pull each side up.                       ADL                       Lower Body Dressing: Min guard;Sit to/from stand   Toilet Transfer: Min guard;Ambulation;Comfort height toilet;Grab bars;RW             General ADL Comments: Discussed at length safety with  ADL including recommendation to have at least initial assist if possible for bathing/dressing routine in am and pt states her neighbor can come by and be there for initial ADL session. pt plans to sponge bathe seated at sink initially. She was able to reach down and don L sock, shoe and pants/underwear wtihout AE use. She thinks she has a shoe horn if needed. Pt tending to let go of walker prematurely when backing up to chair so educated on making sure she fully backs up to surface with walker before letting go of walker. Also gave cues for hand placement to reach back before sitting. She did well with comfort hieght commode and bar and pt thinks she will use her handicap toilet with grab bars during the day and the 3in1 at night next to bed.       Vision                     Perception     Praxis      Cognition   Behavior During Therapy: [facility] for tasks assessed/performed Overall Cognitive Status: Within Functional Limits for tasks assessed                       Extremity/Trunk Assessment               Exercises  Shoulder Instructions       General Comments      Pertinent Vitals/ Pain       Pain Assessment: 0-10 Pain Score: 0-No pain Pain Location: L knee Pain Descriptors / Indicators: Sore Pain Intervention(s): Repositioned;Ice applied  Home Living                                          Prior Functioning/Environment              Frequency Min 2X/week     Progress Toward Goals  OT Goals(current goals can now be found in the care plan section)  Progress towards OT goals: Progressing toward goals  Acute Rehab OT Goals Patient Stated Goal: to be safe at home  Plan Discharge plan remains appropriate    Co-evaluation                 End of Session Equipment Utilized During Treatment: Rolling walker   Activity Tolerance Patient tolerated treatment well   Patient Left in chair;with call bell/phone within reach    Nurse Communication          Time: [id number] OT Time Calculation (min): 27 min  Charges: OT General Charges $OT Visit: 1 Procedure OT Treatments $Self Care/Home Management : 8-22 mins $Therapeutic Activity: 8-22 mins  Lennox LaityStone, [place]  [phone] 01/13/2015, 11:07 AM

## 2015-01-13 NOTE — Progress Notes (Signed)
Physical Therapy Treatment Patient Details Name: Merita NortonGail D Marcoe MRN: [id number] DOB: [birth year] Today's Date: 01/13/2015    History of Present Illness Lt TKA    PT Comments    Patient is making good progress with PT.  From a mobility standpoint anticipate patient will be ready for DC home.     Follow Up Recommendations  Home health PT     Equipment Recommendations  None recommended by PT    Recommendations for Other Services       Precautions / Restrictions      Mobility  Bed Mobility                  Transfers                    Ambulation/Gait                 Stairs            Wheelchair Mobility    Modified Rankin (Stroke Patients Only)       Balance                                    Cognition Arousal/Alertness: Awake/alert Behavior During Therapy: WFL for tasks assessed/performed Overall Cognitive Status: Within Functional Limits for tasks assessed                      Exercises Total Joint Exercises Ankle Circles/Pumps: Both;10 reps Quad Sets: Left;Supine;Strengthening;10 reps Heel Slides: AAROM;Left;10 reps;Supine Hip ABduction/ADduction: Strengthening;Left;10 reps;Supine Straight Leg Raises: 10 reps;Left;Supine;Strengthening (max assist) Long Arc Quad: 5 reps;Left;Strengthening;Seated (mod assist) Knee Flexion: AAROM;Left;10 reps;Seated Goniometric ROM: 77 degrees flexion    General Comments        Pertinent Vitals/Pain Pain Assessment: 0-10 Pain Score: 2  Pain Location: Lt knee Pain Descriptors / Indicators: Aching    Home Living                      Prior Function            PT Goals (current goals can now be found in the care plan section) Acute Rehab PT Goals PT Goal Formulation: With patient Time For Goal Achievement: 01/25/15 Potential to Achieve Goals: Good Progress towards PT goals: Progressing toward goals    Frequency  7X/week    PT Plan Current  plan remains appropriate    Co-evaluation             End of Session   Activity Tolerance: Patient tolerated treatment well Patient left: in chair;with call bell/phone within reach     Time: 1418-1436 PT Time Calculation (min) (ACUTE ONLY): 18 min  Charges:  $Therapeutic Exercise: 8-22 mins                    G CodesDelton See:      Coleta Grosshans 01/13/2015, 3:31 PM

## 2015-01-13 NOTE — Care Management Important Message (Signed)
Important Message  Patient Details  Name: Merita NortonGail D Racz MRN: [id number] Date of Birth: [birth year]   Medicare Important Message Given:  Yes-second notification given    Orson AloeMegan P Nikai Quest 01/13/2015, 1:07 PM

## 2015-01-13 NOTE — Progress Notes (Signed)
OT recommended HHOT. HHOT added to order, Cor[facility] DandyMary [facility] aware [facility]. [facility] providing HHPT and HHOT.

## 2015-01-13 NOTE — Progress Notes (Signed)
Physical Therapy Treatment Patient Details Name: Shannon Martin MRN: [id number] DOB: 1940/07/10 Today's Date: 01/13/2015    History of Present Illness Lt TKA    PT Comments    Patient is making good progress with PT.  From a mobility standpoint anticipate patient will be ready for DC home.     Follow Up Recommendations  Home health PT     Equipment Recommendations  None recommended by PT (patient has rolling walker)    Recommendations for Other Services       Precautions / Restrictions Precautions Precautions: Knee;Fall Restrictions Weight Bearing Restrictions: Yes LLE Weight Bearing: Weight bearing as tolerated    Mobility  Bed Mobility Overal bed mobility: Needs Assistance Bed Mobility: Sit to Supine;Supine to Sit     Supine to sit: Supervision        Transfers Overall transfer level: Needs assistance Equipment used: Rolling walker (2 wheeled) Transfers: Sit to/from Stand Sit to Stand: Supervision            Ambulation/Gait Ambulation/Gait assistance: Supervision Ambulation Distance (Feet): 75 Feet Assistive device: Rolling walker (2 wheeled) Gait Pattern/deviations: Step-through pattern         Stairs Stairs: Yes Stairs assistance: Min guard Stair Management: No rails;With walker Number of Stairs: 1 General stair comments: performed without loss of balance, denies concerns.   Wheelchair Mobility    Modified Rankin (Stroke Patients Only)       Balance Overall balance assessment: Needs assistance Sitting-balance support: No upper extremity supported Sitting balance-Leahy Scale: Fair     Standing balance support: Bilateral upper extremity supported Standing balance-Leahy Scale: Poor                      Cognition Arousal/Alertness: Awake/alert Behavior During Therapy: WFL for tasks assessed/performed Overall Cognitive Status: Within Functional Limits for tasks assessed                      Exercises       General Comments        Pertinent Vitals/Pain Pain Assessment: 0-10 Pain Score: 0-No pain    Home Living                      Prior Function            PT Goals (current goals can now be found in the care plan section) Acute Rehab PT Goals Patient Stated Goal: to be safe at home PT Goal Formulation: With patient Time For Goal Achievement: 01/25/15 Potential to Achieve Goals: Good Progress towards PT goals: Progressing toward goals    Frequency  7X/week    PT Plan Current plan remains appropriate    Co-evaluation             End of Session Equipment Utilized During Treatment: Gait belt Activity Tolerance: Patient tolerated treatment well Patient left: in chair;with call bell/phone within reach     Time: 0819-0845 PT Time Calculation (min) (ACUTE ONLY): 26 min  Charges:  $Gait Training: 23-37 mins                    G Codes:      Christiane HaBenjamin J. Ambri Miltner, PT, CSCS Pager [phone] Office [phone]  01/13/2015, 9:38 AM

## 2015-01-14 DIAGNOSIS — Z7901 Long term (current) use of anticoagulants: Secondary | ICD-10-CM | POA: Diagnosis not present

## 2015-01-14 DIAGNOSIS — Z9181 History of falling: Secondary | ICD-10-CM | POA: Diagnosis not present

## 2015-01-14 DIAGNOSIS — M1990 Unspecified osteoarthritis, unspecified site: Secondary | ICD-10-CM | POA: Diagnosis not present

## 2015-01-14 DIAGNOSIS — G43909 Migraine, unspecified, not intractable, without status migrainosus: Secondary | ICD-10-CM | POA: Diagnosis not present

## 2015-01-14 DIAGNOSIS — Z471 Aftercare following joint replacement surgery: Secondary | ICD-10-CM | POA: Diagnosis not present

## 2015-01-14 DIAGNOSIS — Z96652 Presence of left artificial knee joint: Secondary | ICD-10-CM | POA: Diagnosis not present

## 2015-01-14 DIAGNOSIS — Z79891 Long term (current) use of opiate analgesic: Secondary | ICD-10-CM | POA: Diagnosis not present

## 2015-01-14 DIAGNOSIS — J45909 Unspecified asthma, uncomplicated: Secondary | ICD-10-CM | POA: Diagnosis not present

## 2015-01-15 DIAGNOSIS — G43909 Migraine, unspecified, not intractable, without status migrainosus: Secondary | ICD-10-CM | POA: Diagnosis not present

## 2015-01-15 DIAGNOSIS — Z9181 History of falling: Secondary | ICD-10-CM | POA: Diagnosis not present

## 2015-01-15 DIAGNOSIS — M1990 Unspecified osteoarthritis, unspecified site: Secondary | ICD-10-CM | POA: Diagnosis not present

## 2015-01-15 DIAGNOSIS — Z7901 Long term (current) use of anticoagulants: Secondary | ICD-10-CM | POA: Diagnosis not present

## 2015-01-15 DIAGNOSIS — Z79891 Long term (current) use of opiate analgesic: Secondary | ICD-10-CM | POA: Diagnosis not present

## 2015-01-15 DIAGNOSIS — Z96652 Presence of left artificial knee joint: Secondary | ICD-10-CM | POA: Diagnosis not present

## 2015-01-15 DIAGNOSIS — Z471 Aftercare following joint replacement surgery: Secondary | ICD-10-CM | POA: Diagnosis not present

## 2015-01-15 DIAGNOSIS — J45909 Unspecified asthma, uncomplicated: Secondary | ICD-10-CM | POA: Diagnosis not present

## 2015-01-16 DIAGNOSIS — J45909 Unspecified asthma, uncomplicated: Secondary | ICD-10-CM | POA: Diagnosis not present

## 2015-01-16 DIAGNOSIS — Z471 Aftercare following joint replacement surgery: Secondary | ICD-10-CM | POA: Diagnosis not present

## 2015-01-16 DIAGNOSIS — M1990 Unspecified osteoarthritis, unspecified site: Secondary | ICD-10-CM | POA: Diagnosis not present

## 2015-01-16 DIAGNOSIS — Z9181 History of falling: Secondary | ICD-10-CM | POA: Diagnosis not present

## 2015-01-16 DIAGNOSIS — Z79891 Long term (current) use of opiate analgesic: Secondary | ICD-10-CM | POA: Diagnosis not present

## 2015-01-16 DIAGNOSIS — Z7901 Long term (current) use of anticoagulants: Secondary | ICD-10-CM | POA: Diagnosis not present

## 2015-01-16 DIAGNOSIS — G43909 Migraine, unspecified, not intractable, without status migrainosus: Secondary | ICD-10-CM | POA: Diagnosis not present

## 2015-01-16 DIAGNOSIS — Z96652 Presence of left artificial knee joint: Secondary | ICD-10-CM | POA: Diagnosis not present

## 2015-01-18 DIAGNOSIS — Z9181 History of falling: Secondary | ICD-10-CM | POA: Diagnosis not present

## 2015-01-18 DIAGNOSIS — Z79891 Long term (current) use of opiate analgesic: Secondary | ICD-10-CM | POA: Diagnosis not present

## 2015-01-18 DIAGNOSIS — G43909 Migraine, unspecified, not intractable, without status migrainosus: Secondary | ICD-10-CM | POA: Diagnosis not present

## 2015-01-18 DIAGNOSIS — Z471 Aftercare following joint replacement surgery: Secondary | ICD-10-CM | POA: Diagnosis not present

## 2015-01-18 DIAGNOSIS — M1990 Unspecified osteoarthritis, unspecified site: Secondary | ICD-10-CM | POA: Diagnosis not present

## 2015-01-18 DIAGNOSIS — J45909 Unspecified asthma, uncomplicated: Secondary | ICD-10-CM | POA: Diagnosis not present

## 2015-01-18 DIAGNOSIS — Z96652 Presence of left artificial knee joint: Secondary | ICD-10-CM | POA: Diagnosis not present

## 2015-01-18 DIAGNOSIS — Z7901 Long term (current) use of anticoagulants: Secondary | ICD-10-CM | POA: Diagnosis not present

## 2015-01-18 NOTE — Discharge Summary (Signed)
SPORTS MEDICINE & JOINT REPLACEMENT   Georgena Spurling, MD   Altamese Cabal, PA-C [address] [place], [place], [place]  [address]                             [phone]  PATIENT ID: Shannon Martin        MRN:  [id number]          DOB/AGE: [birth year] / 74 y.o.    DISCHARGE SUMMARY  ADMISSION DATE:    01/11/2015 DISCHARGE DATE: 01/13/2015  ADMISSION DIAGNOSIS: Primary osteoarthritis left knee    DISCHARGE DIAGNOSIS:  Primary osteoarthritis left knee    ADDITIONAL DIAGNOSIS: Active Problems:   S/P total knee arthroplasty  Past Medical History  Diagnosis Date  . Overactive bladder     Takes Vesicare  . GERD (gastroesophageal reflux disease)   . Environmental and seasonal allergies   . Asthma     "mild"  . History of blood transfusion 06/2013    "during hospitalization"  . Migraines 1943 to present    "fairly frequently"; Takes Propranolol (01/12/2015)  . Arthritis     "knees, hands" (01/12/2015)    PROCEDURE: Procedure(s): Left TOTAL KNEE ARTHROPLASTY on 01/11/2015  CONSULTS:     HISTORY:  See H&P in chart  HOSPITAL COURSE:  NASTACIA RAYBUCK is a 74 y.o. admitted on 01/11/2015 and found to have a diagnosis of Primary osteoarthritis left knee.  After appropriate laboratory studies were obtained  they were taken to the operating room on 01/11/2015 and underwent Procedure(s): Left TOTAL KNEE ARTHROPLASTY.   They were given perioperative antibiotics:  Anti-infectives    Start     Dose/Rate Route Frequency Ordered Stop   01/11/15 2100  vancomycin (VANCOCIN) IVPB 1000 mg/200 mL premix     1,000 mg 200 mL/hr over 60 Minutes Intravenous Every 12 hours 01/11/15 1323 01/11/15 2051   01/11/15 0730  vancomycin (VANCOCIN) IVPB 1000 mg/200 mL premix     1,000 mg 200 mL/hr over 60 Minutes Intravenous  Once 01/11/15 0727 01/11/15 0930   01/11/15 0715  ceFAZolin (ANCEF) IVPB 2 g/50 mL premix  Status:  Discontinued     2 g 100 mL/hr over 30 Minutes Intravenous On call to O.R. 01/11/15  0715 01/11/15 0726    .  Tolerated the procedure well.  Placed with a foley intraoperatively.  Given Ofirmev at induction and for 48 hours.    POD# 1: Vital signs were stable.  Patient denied Chest pain, shortness of breath, or calf pain.  Patient was started on Lovenox 30 mg subcutaneously twice daily at 8am.  Consults to PT, OT, and care management were made.  The patient was weight bearing as tolerated.  CPM was placed on the operative leg 0-90 degrees for 6-8 hours a day.  Incentive spirometry was taught.  Dressing was changed.  Hemovac was discontinued.      POD #2, Continued  PT for ambulation and exercise program.  IV saline locked.  O2 discontinued.    The remainder of the hospital course was dedicated to ambulation and strengthening.   The patient was discharged on 2 days post op in  Good condition.  Blood products given:none  DIAGNOSTIC STUDIES: Recent vital signs: No data found.      Recent laboratory studies:  Recent Labs  01/11/15 1424 01/12/15 0445 01/13/15 0449  WBC 9.1 8.2 9.5  HGB 10.9* 9.9* 9.8*  HCT 32.3* 29.5* 28.6*  PLT 255 225  236    Recent Labs  01/11/15 1424 01/12/15 0445  NA  --  134*  K  --  3.5  CL  --  101  CO2  --  26  BUN  --  13  CREATININE 0.82 0.73  GLUCOSE  --  125*  CALCIUM  --  8.5*   Lab Results  Component Value Date   INR 1.02 01/01/2015     Recent Radiographic Studies :  Dg Chest 2 View  01/01/2015   CLINICAL DATA:  Preop, left total knee arthroplasty.  EXAM: CHEST  2 VIEW  COMPARISON:  None.  FINDINGS: The heart size and mediastinal contours are within normal limits. Both lungs are clear. No pneumothorax or pleural effusion is noted. The visualized skeletal structures are unremarkable.  IMPRESSION: No active cardiopulmonary disease.   Electronically Signed   By: Lupita Raider, M.D.   On: 01/01/2015 14:01    DISCHARGE INSTRUCTIONS: Discharge Instructions    CPM    Complete by:  As directed   Continuous passive motion  machine (CPM):      Use the CPM from 0 to 90 for 6-8 hours per day.      You may increase by 10 per day.  You may break it up into 2 or 3 sessions per day.      Use CPM for 2 weeks or until you are told to stop.     Call MD / Call 911    Complete by:  As directed   If you experience chest pain or shortness of breath, CALL 911 and be transported to the hospital emergency room.  If you develope a fever above 101 F, pus (white drainage) or increased drainage or redness at the wound, or calf pain, call your surgeon's office.     Change dressing    Complete by:  As directed   Change dressing on wednesday, then change the dressing daily with sterile 4 x 4 inch gauze dressing and apply TED hose.     Constipation Prevention    Complete by:  As directed   Drink plenty of fluids.  Prune juice may be helpful.  You may use a stool softener, such as Colace (over the counter) 100 mg twice a day.  Use MiraLax (over the counter) for constipation as needed.     Diet - low sodium heart healthy    Complete by:  As directed      Do not put a pillow under the knee. Place it under the heel.    Complete by:  As directed      Driving restrictions    Complete by:  As directed   No driving for 6 weeks     Increase activity slowly as tolerated    Complete by:  As directed      Lifting restrictions    Complete by:  As directed   No lifting for 6 weeks     TED hose    Complete by:  As directed   Use stockings (TED hose) for 3 weeks on both leg(s).  You may remove them at night for sleeping.           DISCHARGE MEDICATIONS:     Medication List    TAKE these medications        Black Cohosh 540 MG Caps  Take 540 mg by mouth 2 (two) times daily.     Calcium + D3 600-200 MG-UNIT Tabs  Take 1 tablet by  mouth 2 (two) times daily.     enoxaparin 40 MG/0.4ML injection  Commonly known as:  LOVENOX  Inject 0.4 mLs (40 mg total) into the skin daily.     EXCEDRIN MIGRAINE PO  Take 2 tablets by mouth daily as  needed (for headaches).     fluticasone 50 MCG/ACT nasal spray  Commonly known as:  FLONASE  Place 1 spray into both nostrils daily.     HYDROcodone-acetaminophen 5-325 MG per tablet  Commonly known as:  NORCO/VICODIN  Take 1-2 tablets by mouth every 4 (four) hours as needed for moderate pain.     loratadine 10 MG tablet  Commonly known as:  CLARITIN  Take 10 mg by mouth daily.     MULTIVITAMIN PO  Take 1 tablet by mouth daily.     omeprazole 40 MG capsule  Commonly known as:  PRILOSEC  Take 40 mg by mouth 2 (two) times daily.     OVER THE COUNTER MEDICATION  Place 1 drop into both eyes daily. "[employer] Brand Dry Eyes"     propranolol 40 MG tablet  Commonly known as:  INDERAL  Take 40 mg by mouth daily.     PULMICORT FLEXHALER 180 MCG/ACT inhaler  Generic drug:  budesonide  Inhale 2 puffs into the lungs 2 (two) times daily.     simvastatin 40 MG tablet  Commonly known as:  ZOCOR  Take 40 mg by mouth daily.     solifenacin 5 MG tablet  Commonly known as:  VESICARE  Take 5 mg by mouth daily.     tiZANidine 2 MG tablet  Commonly known as:  ZANAFLEX  Take 1 tablet (2 mg total) by mouth every 6 (six) hours as needed.        FOLLOW UP VISIT:       Follow-up Information    Follow up with [facility].   Why:  They will contact you to schedule home therapy visits.   Contact information:   [address] [place] [address] [phone]       Follow up with Raymon Mutton, MD. Call on 01/12/2015.   Specialty:  Orthopedic Surgery   Contact information:   [address] [place] [address] [phone]       DISPOSITION: HOME   CONDITION:  Good   Naomia Lenderman 01/18/2015, 9:52 AM

## 2015-01-20 DIAGNOSIS — Z96652 Presence of left artificial knee joint: Secondary | ICD-10-CM | POA: Diagnosis not present

## 2015-01-20 DIAGNOSIS — Z79891 Long term (current) use of opiate analgesic: Secondary | ICD-10-CM | POA: Diagnosis not present

## 2015-01-20 DIAGNOSIS — Z471 Aftercare following joint replacement surgery: Secondary | ICD-10-CM | POA: Diagnosis not present

## 2015-01-20 DIAGNOSIS — M1990 Unspecified osteoarthritis, unspecified site: Secondary | ICD-10-CM | POA: Diagnosis not present

## 2015-01-20 DIAGNOSIS — Z7901 Long term (current) use of anticoagulants: Secondary | ICD-10-CM | POA: Diagnosis not present

## 2015-01-20 DIAGNOSIS — Z9181 History of falling: Secondary | ICD-10-CM | POA: Diagnosis not present

## 2015-01-20 DIAGNOSIS — G43909 Migraine, unspecified, not intractable, without status migrainosus: Secondary | ICD-10-CM | POA: Diagnosis not present

## 2015-01-20 DIAGNOSIS — J45909 Unspecified asthma, uncomplicated: Secondary | ICD-10-CM | POA: Diagnosis not present

## 2015-01-22 DIAGNOSIS — M1990 Unspecified osteoarthritis, unspecified site: Secondary | ICD-10-CM | POA: Diagnosis not present

## 2015-01-22 DIAGNOSIS — G43909 Migraine, unspecified, not intractable, without status migrainosus: Secondary | ICD-10-CM | POA: Diagnosis not present

## 2015-01-22 DIAGNOSIS — Z471 Aftercare following joint replacement surgery: Secondary | ICD-10-CM | POA: Diagnosis not present

## 2015-01-22 DIAGNOSIS — Z9181 History of falling: Secondary | ICD-10-CM | POA: Diagnosis not present

## 2015-01-22 DIAGNOSIS — Z96652 Presence of left artificial knee joint: Secondary | ICD-10-CM | POA: Diagnosis not present

## 2015-01-22 DIAGNOSIS — Z7901 Long term (current) use of anticoagulants: Secondary | ICD-10-CM | POA: Diagnosis not present

## 2015-01-22 DIAGNOSIS — Z79891 Long term (current) use of opiate analgesic: Secondary | ICD-10-CM | POA: Diagnosis not present

## 2015-01-22 DIAGNOSIS — J45909 Unspecified asthma, uncomplicated: Secondary | ICD-10-CM | POA: Diagnosis not present

## 2015-01-26 DIAGNOSIS — M25562 Pain in left knee: Secondary | ICD-10-CM | POA: Diagnosis not present

## 2015-01-26 DIAGNOSIS — Z96652 Presence of left artificial knee joint: Secondary | ICD-10-CM | POA: Diagnosis not present

## 2015-01-27 DIAGNOSIS — M25662 Stiffness of left knee, not elsewhere classified: Secondary | ICD-10-CM | POA: Diagnosis not present

## 2015-01-27 DIAGNOSIS — M6281 Muscle weakness (generalized): Secondary | ICD-10-CM | POA: Diagnosis not present

## 2015-01-27 DIAGNOSIS — Z96652 Presence of left artificial knee joint: Secondary | ICD-10-CM | POA: Diagnosis not present

## 2015-01-29 DIAGNOSIS — Z96652 Presence of left artificial knee joint: Secondary | ICD-10-CM | POA: Diagnosis not present

## 2015-01-29 DIAGNOSIS — M6281 Muscle weakness (generalized): Secondary | ICD-10-CM | POA: Diagnosis not present

## 2015-01-29 DIAGNOSIS — M25662 Stiffness of left knee, not elsewhere classified: Secondary | ICD-10-CM | POA: Diagnosis not present

## 2015-02-02 DIAGNOSIS — M25662 Stiffness of left knee, not elsewhere classified: Secondary | ICD-10-CM | POA: Diagnosis not present

## 2015-02-02 DIAGNOSIS — M6281 Muscle weakness (generalized): Secondary | ICD-10-CM | POA: Diagnosis not present

## 2015-02-02 DIAGNOSIS — Z96652 Presence of left artificial knee joint: Secondary | ICD-10-CM | POA: Diagnosis not present

## 2015-02-05 DIAGNOSIS — M25662 Stiffness of left knee, not elsewhere classified: Secondary | ICD-10-CM | POA: Diagnosis not present

## 2015-02-05 DIAGNOSIS — Z96652 Presence of left artificial knee joint: Secondary | ICD-10-CM | POA: Diagnosis not present

## 2015-02-05 DIAGNOSIS — M6281 Muscle weakness (generalized): Secondary | ICD-10-CM | POA: Diagnosis not present

## 2015-02-09 DIAGNOSIS — M25662 Stiffness of left knee, not elsewhere classified: Secondary | ICD-10-CM | POA: Diagnosis not present

## 2015-02-09 DIAGNOSIS — M6281 Muscle weakness (generalized): Secondary | ICD-10-CM | POA: Diagnosis not present

## 2015-02-09 DIAGNOSIS — Z96652 Presence of left artificial knee joint: Secondary | ICD-10-CM | POA: Diagnosis not present

## 2015-02-16 DIAGNOSIS — M6281 Muscle weakness (generalized): Secondary | ICD-10-CM | POA: Diagnosis not present

## 2015-02-16 DIAGNOSIS — M25662 Stiffness of left knee, not elsewhere classified: Secondary | ICD-10-CM | POA: Diagnosis not present

## 2015-02-16 DIAGNOSIS — J45909 Unspecified asthma, uncomplicated: Secondary | ICD-10-CM | POA: Insufficient documentation

## 2015-02-16 DIAGNOSIS — Z96652 Presence of left artificial knee joint: Secondary | ICD-10-CM | POA: Diagnosis not present

## 2015-02-16 DIAGNOSIS — J309 Allergic rhinitis, unspecified: Secondary | ICD-10-CM | POA: Insufficient documentation

## 2015-02-16 HISTORY — DX: Allergic rhinitis, unspecified: J30.9

## 2015-02-16 HISTORY — DX: Unspecified asthma, uncomplicated: J45.909

## 2015-02-23 DIAGNOSIS — M25662 Stiffness of left knee, not elsewhere classified: Secondary | ICD-10-CM | POA: Diagnosis not present

## 2015-02-23 DIAGNOSIS — Z96652 Presence of left artificial knee joint: Secondary | ICD-10-CM | POA: Diagnosis not present

## 2015-02-23 DIAGNOSIS — M6281 Muscle weakness (generalized): Secondary | ICD-10-CM | POA: Diagnosis not present

## 2015-02-26 DIAGNOSIS — Z96652 Presence of left artificial knee joint: Secondary | ICD-10-CM | POA: Diagnosis not present

## 2015-02-26 DIAGNOSIS — M6281 Muscle weakness (generalized): Secondary | ICD-10-CM | POA: Diagnosis not present

## 2015-02-26 DIAGNOSIS — M25662 Stiffness of left knee, not elsewhere classified: Secondary | ICD-10-CM | POA: Diagnosis not present

## 2015-03-05 DIAGNOSIS — Z96652 Presence of left artificial knee joint: Secondary | ICD-10-CM | POA: Diagnosis not present

## 2015-03-05 DIAGNOSIS — M25662 Stiffness of left knee, not elsewhere classified: Secondary | ICD-10-CM | POA: Diagnosis not present

## 2015-03-05 DIAGNOSIS — M6281 Muscle weakness (generalized): Secondary | ICD-10-CM | POA: Diagnosis not present

## 2015-03-08 DIAGNOSIS — K219 Gastro-esophageal reflux disease without esophagitis: Secondary | ICD-10-CM | POA: Insufficient documentation

## 2015-03-12 DIAGNOSIS — M25662 Stiffness of left knee, not elsewhere classified: Secondary | ICD-10-CM | POA: Diagnosis not present

## 2015-03-12 DIAGNOSIS — M6281 Muscle weakness (generalized): Secondary | ICD-10-CM | POA: Diagnosis not present

## 2015-03-12 DIAGNOSIS — Z96652 Presence of left artificial knee joint: Secondary | ICD-10-CM | POA: Diagnosis not present

## 2015-03-23 DIAGNOSIS — Z6831 Body mass index (BMI) 31.0-31.9, adult: Secondary | ICD-10-CM | POA: Diagnosis not present

## 2015-03-23 DIAGNOSIS — Z0000 Encounter for general adult medical examination without abnormal findings: Secondary | ICD-10-CM | POA: Diagnosis not present

## 2015-03-23 DIAGNOSIS — M159 Polyosteoarthritis, unspecified: Secondary | ICD-10-CM | POA: Diagnosis not present

## 2015-03-23 DIAGNOSIS — I10 Essential (primary) hypertension: Secondary | ICD-10-CM | POA: Diagnosis not present

## 2015-03-23 DIAGNOSIS — E785 Hyperlipidemia, unspecified: Secondary | ICD-10-CM | POA: Diagnosis not present

## 2015-03-23 DIAGNOSIS — J45909 Unspecified asthma, uncomplicated: Secondary | ICD-10-CM | POA: Diagnosis not present

## 2015-03-23 DIAGNOSIS — K219 Gastro-esophageal reflux disease without esophagitis: Secondary | ICD-10-CM | POA: Diagnosis not present

## 2015-03-23 DIAGNOSIS — G43909 Migraine, unspecified, not intractable, without status migrainosus: Secondary | ICD-10-CM | POA: Diagnosis not present

## 2015-03-23 DIAGNOSIS — N183 Chronic kidney disease, stage 3 (moderate): Secondary | ICD-10-CM | POA: Diagnosis not present

## 2015-03-24 ENCOUNTER — Encounter: Payer: Self-pay | Admitting: Allergy and Immunology

## 2015-03-24 ENCOUNTER — Ambulatory Visit (INDEPENDENT_AMBULATORY_CARE_PROVIDER_SITE_OTHER): Payer: Medicare Other | Admitting: Allergy and Immunology

## 2015-03-24 VITALS — BP 128/76 | HR 64 | Resp 12

## 2015-03-24 DIAGNOSIS — J387 Other diseases of larynx: Secondary | ICD-10-CM | POA: Diagnosis not present

## 2015-03-24 DIAGNOSIS — J3089 Other allergic rhinitis: Secondary | ICD-10-CM | POA: Diagnosis not present

## 2015-03-24 DIAGNOSIS — K219 Gastro-esophageal reflux disease without esophagitis: Secondary | ICD-10-CM

## 2015-03-24 DIAGNOSIS — J4540 Moderate persistent asthma, uncomplicated: Secondary | ICD-10-CM | POA: Diagnosis not present

## 2015-03-24 HISTORY — DX: Gastro-esophageal reflux disease without esophagitis: K21.9

## 2015-03-24 HISTORY — DX: Moderate persistent asthma, uncomplicated: J45.40

## 2015-03-24 NOTE — Progress Notes (Signed)
FOLLOW UP NOTE  RE: Shannon Martin MRN: [id number] Date of Birth: 1940/11/27 [facility]    TAYLA PANOZZO is a 74 y.o. female with a history of:   Asthma, moderate persistent - She has been doing well since her last visit of December 2016 without any exacerbations or steroid requirement. She rarely uses a SABA and can exercise without problem although she is somewhat limited by her TKR which was placed in July. She continues on samples of Pulmicort.  Allergic rhinitis - While using nasal steroids and claritin she has had very little problem with her nose. No anosmia, ugly nasal discharge or other problems.   LPR - While using Omeprazole 40 bid she has had no problem with her throat or stomach.   CURRENT MEDICAL TREATMENT  Current outpatient prescriptions:  .  Albuterol Sulfate (PROVENTIL HFA IN), Inhale 1 puff into the lungs as needed., Disp: , Rfl:  .  Ascorbic Acid (VITAMIN C PO), Take 1 tablet by mouth 2 (two) times daily., Disp: , Rfl:  .  Aspirin-Acetaminophen-Caffeine (EXCEDRIN MIGRAINE PO), Take 2 tablets by mouth daily as needed (for headaches)., Disp: , Rfl:  .  Black Cohosh 540 MG CAPS, Take 540 mg by mouth 2 (two) times daily., Disp: , Rfl:  .  budesonide (PULMICORT FLEXHALER) 180 MCG/ACT inhaler, Inhale 2 puffs into the lungs 2 (two) times daily., Disp: , Rfl:  .  Calcium Carb-Cholecalciferol (CALCIUM + D3) 600-200 MG-UNIT TABS, Take 1 tablet by mouth 2 (two) times daily., Disp: , Rfl:  .  fluticasone (FLONASE) 50 MCG/ACT nasal spray, Place 1 spray into both nostrils daily., Disp: , Rfl:  .  HYDROcodone-acetaminophen (NORCO/VICODIN) 5-325 MG per tablet, Take 1-2 tablets by mouth every 4 (four) hours as needed for moderate pain., Disp: 90 tablet, Rfl: 0 .  Hypromellose (ARTIFICIAL TEARS OP), Apply 1 drop to eye as needed., Disp: , Rfl:  .  loratadine (CLARITIN) 10 MG tablet, Take 10 mg by mouth daily., Disp: , Rfl:  .  Multiple Vitamins-Minerals  (MULTIVITAMIN PO), Take 1 tablet by mouth daily., Disp: , Rfl:  .  omeprazole (PRILOSEC) 40 MG capsule, Take 40 mg by mouth 2 (two) times daily., Disp: , Rfl:  .  propranolol (INDERAL) 40 MG tablet, Take 40 mg by mouth daily., Disp: , Rfl:  .  simvastatin (ZOCOR) 40 MG tablet, Take 40 mg by mouth daily., Disp: , Rfl:  .  solifenacin (VESICARE) 5 MG tablet, Take 5 mg by mouth daily., Disp: , Rfl:  .  VITAMIN E PO, Take 1 capsule by mouth as needed., Disp: , Rfl:  .  albuterol (ACCUNEB) 1.25 MG/3ML nebulizer solution, Take 1 ampule by nebulization every 6 (six) hours as needed for wheezing., Disp: , Rfl:  .  enoxaparin (LOVENOX) 40 MG/0.4ML injection, Inject 0.4 mLs (40 mg total) into the skin daily. (Patient not taking: Reported on 03/24/2015), Disp: 12 Syringe, Rfl: 0 .  fluticasone (FLOVENT HFA) 44 MCG/ACT inhaler, Inhale 2 puffs into the lungs 2 (two) times daily., Disp: , Rfl:  .  OVER THE COUNTER MEDICATION, Place 1 drop into both eyes daily. "[employer] Brand Dry Eyes", Disp: , Rfl:  .  tiZANidine (ZANAFLEX) 2 MG tablet, Take 1 tablet (2 mg total) by mouth every 6 (six) hours as needed. (Patient not taking: Reported on 03/24/2015), Disp: 60 tablet, Rfl: 2  DRUG ALLERGY: Allergies as of 03/24/2015 - Review Complete 03/24/2015  Allergen Reaction Noted  . Tetanus toxoids Swelling and  Other (See Comments) 12/31/2014  . Bee venom Swelling and Other (See Comments) 12/31/2014  . Dilaudid [hydromorphone] Nausea And Vomiting 01/11/2015  . Erythromycin Nausea And Vomiting 03/24/2015  . Fiorinal [butalbital-aspirin-caffeine]  03/24/2015  . Amoxicillin Other (See Comments) 12/31/2014  . Doxycycline Other (See Comments) 12/31/2014  . Latex Other (See Comments) 12/31/2014  . Neomycin Other (See Comments) 12/31/2014  . Penicillins Itching, Swelling, and Rash 12/31/2014    PHYSICAL EXAM: BP 128/76 mmHg  Pulse 64  Resp 12 Physical Exam  Constitutional: She appears well-developed.  HENT:  Head:  Normocephalic.  Right Ear: External ear normal.  Left Ear: External ear normal.  Nose: Nose normal.  Mouth/Throat: Oropharynx is clear and moist.  Eyes: Conjunctivae are normal. Right eye exhibits no discharge. Left eye exhibits no discharge.  Neck: No JVD present. No tracheal deviation present. No thyromegaly present.  Cardiovascular: Normal rate, regular rhythm and normal heart sounds.  Exam reveals no gallop and no friction rub.   No murmur heard. Pulmonary/Chest: No stridor. No respiratory distress. She has no wheezes. She has no rales. She exhibits no tenderness.  Musculoskeletal: She exhibits no edema.  Lymphadenopathy:    She has no cervical adenopathy.    DIAGNOSTICS: Spirometry was performed and demonstrated a FEV1 of 1.94 @ 101% predicted  ASSESSMENT AND PLAN:  Asthma, moderate persistent Allergic Rhinitis Laryngopharyngeal reflux  1.Continue Pulmicort 180, flonase, omeprazole, proair hfa, albuterol nebulization, and claritin as previous prescribed. 2.Get a flu vaccine. 3.Return to clinic in 6 months or earlier if problem.

## 2015-04-05 ENCOUNTER — Other Ambulatory Visit: Payer: Self-pay

## 2015-04-05 DIAGNOSIS — K219 Gastro-esophageal reflux disease without esophagitis: Secondary | ICD-10-CM

## 2015-04-05 MED ORDER — OMEPRAZOLE 40 MG PO CPDR: 40.0000 mg | DELAYED_RELEASE_CAPSULE | Freq: Two times a day (BID) | ORAL | Status: DC

## 2015-05-11 DIAGNOSIS — M79672 Pain in left foot: Secondary | ICD-10-CM | POA: Diagnosis not present

## 2015-05-11 DIAGNOSIS — Z96652 Presence of left artificial knee joint: Secondary | ICD-10-CM | POA: Diagnosis not present

## 2015-06-15 NOTE — Addendum Note (Signed)
Addended by: Jessica PriestKOZLOW, Lynetta Tomczak J on: 06/15/2015 07:34 PM   Modules accepted: Level of Service

## 2015-06-17 DIAGNOSIS — M7632 Iliotibial band syndrome, left leg: Secondary | ICD-10-CM

## 2015-06-17 HISTORY — DX: Iliotibial band syndrome, left leg: M76.32

## 2015-09-20 DIAGNOSIS — N183 Chronic kidney disease, stage 3 (moderate): Secondary | ICD-10-CM | POA: Diagnosis not present

## 2015-09-20 DIAGNOSIS — G43909 Migraine, unspecified, not intractable, without status migrainosus: Secondary | ICD-10-CM | POA: Diagnosis not present

## 2015-09-20 DIAGNOSIS — J45909 Unspecified asthma, uncomplicated: Secondary | ICD-10-CM | POA: Diagnosis not present

## 2015-09-20 DIAGNOSIS — E786 Lipoprotein deficiency: Secondary | ICD-10-CM | POA: Diagnosis not present

## 2015-09-20 DIAGNOSIS — G43809 Other migraine, not intractable, without status migrainosus: Secondary | ICD-10-CM | POA: Diagnosis not present

## 2015-09-22 ENCOUNTER — Encounter: Payer: Self-pay | Admitting: Allergy and Immunology

## 2015-09-22 ENCOUNTER — Ambulatory Visit (INDEPENDENT_AMBULATORY_CARE_PROVIDER_SITE_OTHER): Payer: Medicare Other | Admitting: Allergy and Immunology

## 2015-09-22 VITALS — BP 130/88 | HR 74 | Resp 18

## 2015-09-22 DIAGNOSIS — J4540 Moderate persistent asthma, uncomplicated: Secondary | ICD-10-CM | POA: Diagnosis not present

## 2015-09-22 DIAGNOSIS — J387 Other diseases of larynx: Secondary | ICD-10-CM | POA: Diagnosis not present

## 2015-09-22 DIAGNOSIS — J3089 Other allergic rhinitis: Secondary | ICD-10-CM

## 2015-09-22 DIAGNOSIS — K219 Gastro-esophageal reflux disease without esophagitis: Secondary | ICD-10-CM

## 2015-09-22 NOTE — Patient Instructions (Addendum)
  1. Continue Pulmicort 180- 2 inhalations one-2 times a day depending on disease activity    2. Continue Flonase one spray each nostril 1-2 times per day depending on disease activity  3. Continue omeprazole 40 mg twice a day  4. Continue ProAir HFA and albuterol nebulization and Claritin if needed  5. Return to clinic in 6 months or earlier if problem

## 2015-09-22 NOTE — Progress Notes (Signed)
Follow-up Note  Referring Provider: Abigail Martin, Shannon Martin,* Primary Provider: Abigail Martin,Shannon EDWARD, MD Date of Office Visit: 09/22/2015  Subjective:   Shannon Martin (DOB: [birth year]) is a 75 y.o. female who [facility] in re-evaluation of the following:  HPI Comments: Shannon Martin returns to this clinic in reevaluation of her asthma, allergic rhinitis, and LPR. I have not seen her in his clinic in approximately 6 months. During the interval she has done great without any exacerbations of her asthma requiring a systemic steroid, minimal requirement for short acting bronchodilator less than 1 time per week, good ability to exercise if desired, very little problem with her nose, and no problem with her throat or stomach while using medical therapy which includes Pulmicort, Flonase, omeprazole and Claritin. She did receive the flu vaccine this year.     Medication List           ARTIFICIAL TEARS OP  Apply 1 drop to eye as needed.     Black Cohosh 540 MG Caps  Take 540 mg by mouth 2 (two) times daily.     Calcium + D3 600-200 MG-UNIT Tabs  Take 1 tablet by mouth 2 (two) times daily.     celecoxib 200 MG capsule  Commonly known as:  CELEBREX  Reported on 09/22/2015     enoxaparin 40 MG/0.4ML injection  Commonly known as:  LOVENOX  Inject 0.4 mLs (40 mg total) into the skin daily.     EXCEDRIN MIGRAINE PO  Take 2 tablets by mouth daily as needed (for headaches).     fluticasone 44 MCG/ACT inhaler  Commonly known as:  FLOVENT HFA  Inhale 2 puffs into the lungs 2 (two) times daily. Reported on 09/22/2015     fluticasone 50 MCG/ACT nasal spray  Commonly known as:  FLONASE  Place 1 spray into both nostrils daily.     HYDROcodone-acetaminophen 5-325 MG tablet  Commonly known as:  NORCO/VICODIN  Take 1-2 tablets by mouth every 4 (four) hours as needed for moderate pain.     loratadine 10 MG tablet  Commonly known as:  CLARITIN  Take 10  mg by mouth daily.     MULTIVITAMIN PO  Take 1 tablet by mouth daily.     omeprazole 40 MG capsule  Commonly known as:  PRILOSEC  Take 1 capsule (40 mg total) by mouth 2 (two) times daily.     OVER THE COUNTER MEDICATION  Place 1 drop into both eyes daily. "[employer] Brand Dry Eyes"     polyethylene glycol powder powder  Commonly known as:  GLYCOLAX/MIRALAX     propranolol 40 MG tablet  Commonly known as:  INDERAL  Take 40 mg by mouth daily.     PROVENTIL HFA IN  Inhale 1 puff into the lungs as needed.     albuterol 1.25 MG/3ML nebulizer solution  Commonly known as:  ACCUNEB  Take 1 ampule by nebulization every 6 (six) hours as needed for wheezing. Reported on 09/22/2015     PULMICORT FLEXHALER 180 MCG/ACT inhaler  Generic drug:  budesonide  Inhale 2 puffs into the lungs 2 (two) times daily.     simvastatin 40 MG tablet  Commonly known as:  ZOCOR  Take 40 mg by mouth daily.     solifenacin 5 MG tablet  Commonly known as:  VESICARE  Take 5 mg by mouth daily.     VITAMIN C PO  Take 1 tablet by mouth  2 (two) times daily.     VITAMIN E PO  Take 1 capsule by mouth as needed.        Past Medical History  Diagnosis Date  . Overactive bladder     Takes Vesicare  . GERD (gastroesophageal reflux disease)   . Environmental and seasonal allergies   . Asthma     "mild"  . History of blood transfusion 06/2013    "during hospitalization"  . Migraines 1943 to present    "fairly frequently"; Takes Propranolol (01/12/2015)  . Arthritis     "knees, hands" (01/12/2015)    Past Surgical History  Procedure Laterality Date  . Chest tube insertion  January 2015    X 2 at [facility]  . Video assisted thoracoscopy (vats)/thorocotomy Left 07/13/2013    VATS with insertion of chest tubes  . Tonsillectomy and adenoidectomy  1946; 1947  . Joint replacement    . Breast biopsy Left ~ 1973  . Dilation and curettage of uterus  4-5  . 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  Cataract extraction w/ intraocular lens  implant, bilateral Bilateral 2003  . 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 Replacement total knee Left     Allergies  Allergen Reactions  . Tetanus Toxoids Swelling and Other (See Comments)    Swelling around throat and jaws  . Bee Venom Swelling and Other (See Comments)    Crusty area on skin  . Dilaudid [Hydromorphone] Nausea And Vomiting    Causes nausea and vomiting  . Erythromycin Nausea And Vomiting  . Fiorinal [Butalbital-Aspirin-Caffeine]     Altered mental status  . Ibuprofen Other (See Comments)    Blotches on skin, then skin peeling.  . Amoxicillin Other (See Comments)    Upset stomach  . Doxycycline Other (See Comments)    Upset stomach  . Latex Other (See Comments)    Skin will crack open and bleed  . Neomycin Other (See Comments)    Irritates skin  . Penicillins Itching, Swelling and Rash    Review of systems negative except as noted in HPI / PMHx or noted below:  Review of Systems  Constitutional: Negative.   HENT: Negative.   Eyes: Negative.   Respiratory: Negative.   Cardiovascular: Negative.   Gastrointestinal: Negative.   Genitourinary: Negative.   Musculoskeletal: Negative.   Skin: Negative.   Neurological: Negative.   Endo/Heme/Allergies: Negative.   Psychiatric/Behavioral: Negative.      Objective:   Filed Vitals:   09/22/15 1358  BP: 130/88  Pulse: 74  Resp: 18          Physical Exam  Constitutional: She is well-developed, well-nourished, and in no distress.  HENT:  Head: Normocephalic.  Right Ear: Tympanic membrane, external ear and ear canal normal.  Left Ear: Tympanic membrane, external ear and ear canal normal.  Nose: Nose normal. No mucosal edema or rhinorrhea.  Mouth/Throat: Uvula is midline, oropharynx is clear and moist and mucous membranes are  normal. No oropharyngeal exudate.  Eyes: Conjunctivae are normal.  Neck: Trachea normal. No tracheal tenderness present. No tracheal deviation present. No thyromegaly present.  Cardiovascular: Normal rate, regular rhythm, S1 normal, S2 normal and normal heart sounds.   No murmur heard. Pulmonary/Chest: Breath sounds normal. No stridor. No respiratory distress. She has no wheezes. She has no rales.  Musculoskeletal: She exhibits no edema.  Lymphadenopathy:       Head (right side): No tonsillar adenopathy present.       Head (left side): No tonsillar adenopathy present.    She has no cervical adenopathy.    She has no axillary adenopathy.  Neurological: She is alert. Gait normal.  Skin: No rash noted. She is not diaphoretic. No erythema. Nails show no clubbing.  Psychiatric: Mood and affect normal.    Diagnostics:    Spirometry was performed and demonstrated an FEV1 of 1.97 at 88 % of predicted.  The patient had an Asthma Control Test with the following results:  .    Assessment and Plan:   1. Moderate persistent asthma, uncomplicated   2. Other allergic rhinitis   3. Laryngopharyngeal reflux (LPR)     1. Continue Pulmicort 180- 2 inhalations one-2 times a day depending on disease activity    2. Continue Flonase one spray each nostril 1-2 times per day depending on disease activity  3. Continue omeprazole 40 mg twice a day  4. Continue ProAir HFA and albuterol nebulization and Claritin if needed  5. Return to clinic in 6 months or earlier if problem  Blondina has really done well with her current medical therapy and I see no need for changing this treatment at this point in time and I will see her back in this clinic in approximately 6 months or earlier if there is any problem.  Laurette Schimke, MD [facility]

## 2015-09-29 DIAGNOSIS — W57XXXA Bitten or stung by nonvenomous insect and other nonvenomous arthropods, initial encounter: Secondary | ICD-10-CM | POA: Diagnosis not present

## 2015-09-29 DIAGNOSIS — G43909 Migraine, unspecified, not intractable, without status migrainosus: Secondary | ICD-10-CM | POA: Diagnosis not present

## 2015-10-20 ENCOUNTER — Telehealth: Payer: Self-pay

## 2015-10-20 NOTE — Telephone Encounter (Signed)
Per Dr. Lucie LeatherKozlow, pt contacted and instructed to have CXR done . [facility]

## 2015-10-21 DIAGNOSIS — R0602 Shortness of breath: Secondary | ICD-10-CM | POA: Diagnosis not present

## 2015-10-21 DIAGNOSIS — R079 Chest pain, unspecified: Secondary | ICD-10-CM | POA: Diagnosis not present

## 2015-10-21 DIAGNOSIS — R0600 Dyspnea, unspecified: Secondary | ICD-10-CM | POA: Diagnosis not present

## 2015-10-21 DIAGNOSIS — M7552 Bursitis of left shoulder: Secondary | ICD-10-CM | POA: Diagnosis not present

## 2015-10-21 DIAGNOSIS — R05 Cough: Secondary | ICD-10-CM | POA: Diagnosis not present

## 2015-10-22 ENCOUNTER — Encounter: Payer: Self-pay | Admitting: *Deleted

## 2015-11-13 ENCOUNTER — Other Ambulatory Visit: Payer: Self-pay | Admitting: Allergy and Immunology

## 2016-01-03 DIAGNOSIS — R634 Abnormal weight loss: Secondary | ICD-10-CM | POA: Diagnosis not present

## 2016-01-03 DIAGNOSIS — Z6828 Body mass index (BMI) 28.0-28.9, adult: Secondary | ICD-10-CM | POA: Diagnosis not present

## 2016-01-03 DIAGNOSIS — R6881 Early satiety: Secondary | ICD-10-CM | POA: Diagnosis not present

## 2016-01-03 DIAGNOSIS — J019 Acute sinusitis, unspecified: Secondary | ICD-10-CM | POA: Diagnosis not present

## 2016-01-10 ENCOUNTER — Other Ambulatory Visit: Payer: Self-pay

## 2016-01-10 DIAGNOSIS — Z1211 Encounter for screening for malignant neoplasm of colon: Secondary | ICD-10-CM | POA: Diagnosis not present

## 2016-01-10 MED ORDER — OMEPRAZOLE 40 MG PO CPDR: DELAYED_RELEASE_CAPSULE | ORAL | Status: DC

## 2016-01-13 ENCOUNTER — Ambulatory Visit (INDEPENDENT_AMBULATORY_CARE_PROVIDER_SITE_OTHER): Payer: Medicare Other | Admitting: Allergy and Immunology

## 2016-01-13 ENCOUNTER — Encounter: Payer: Self-pay | Admitting: Allergy and Immunology

## 2016-01-13 VITALS — BP 126/80 | HR 100 | Resp 20

## 2016-01-13 DIAGNOSIS — R6881 Early satiety: Secondary | ICD-10-CM

## 2016-01-13 DIAGNOSIS — R634 Abnormal weight loss: Secondary | ICD-10-CM | POA: Diagnosis not present

## 2016-01-13 DIAGNOSIS — N289 Disorder of kidney and ureter, unspecified: Secondary | ICD-10-CM

## 2016-01-13 DIAGNOSIS — J3089 Other allergic rhinitis: Secondary | ICD-10-CM | POA: Diagnosis not present

## 2016-01-13 DIAGNOSIS — K219 Gastro-esophageal reflux disease without esophagitis: Secondary | ICD-10-CM

## 2016-01-13 DIAGNOSIS — J4540 Moderate persistent asthma, uncomplicated: Secondary | ICD-10-CM

## 2016-01-13 DIAGNOSIS — J387 Other diseases of larynx: Secondary | ICD-10-CM | POA: Diagnosis not present

## 2016-01-13 NOTE — Patient Instructions (Addendum)
  1. Arnuity 200 one inhalation once daily to replace Pulmicort   2. Continue Flonase one spray each nostril 1-2 times per day depending on disease activity  3. Stop omeprazole and start ranitidine 150 mg twice a day  4. Review results of blood tests from Dr. Marina GoodellPerry  5. Arrange to have upper endoscopy performed by gastroenterologist  6. Continue ProAir HFA and albuterol nebulization and Claritin and nasal saline if needed  5. Return to clinic in 2 weeks or earlier if problem

## 2016-01-13 NOTE — Progress Notes (Signed)
Follow-up Note  Referring Provider: Abigail Martin, Shannon Martin,* Primary Provider: Abigail Martin,Shannon EDWARD, MD Date of Office Visit: 01/13/2016  Subjective:   Shannon Martin (DOB: 10-Oct-1940) is a 75 y.o. female who [facility]2016 in re-evaluation of the following:  HPI: Shannon SpryGail returns to this clinic in reevaluation of her asthma and allergic rhinitis and LPR. I last saw her in his clinic in March 2017. She has several complaints.  First, she has had some problems with her breathing over the course of the past 2 months or so. She feels as though she has a little bit more work of breathing and she has "heavy air". She gets somewhat dyspneic when exerting herself. She does not use a short acting bronchodilator. She's continued to use her Pulmicort consistently. She does not have any chest pain and she's not noticed any swelling of her legs. Her last chest x-ray was several months ago  Second, she's recently had an issue with a sinus infection that required the administration of an antibiotic and a systemic steroid. She had nasal congestion that was green about 2 weeks ago. She is much better regarding this issue with the therapy that Shannon Martin as prescribed. Prior to this point time she was doing very well without any issues while using her nasal steroid  Third, she states that she has had several tick bites the spring and one was embedded and had to be removed by Shannon Martin. Every since that point in time she's had "revulsion" when she sees or thinks about meats. In addition, she has lost about 19 pounds of weight. As well, she has very early satiety. She thinks her reflux is under control on her proton pump inhibitor twice a day  Fourth, she's recently been told by Shannon Martin that her kidney numbers are up. She scheduled to see a nephrologist. She does continue on proton pump inhibitor twice a day. In addition, she was using multiple dosages of ibuprofen  throughout the day for knee pain which she stopped 3 weeks ago.    Medication List           ARTIFICIAL TEARS OP  Apply 1 drop to eye as needed.     Black Cohosh 540 MG Caps  Take 540 mg by mouth 2 (two) times daily.     Calcium + D3 600-200 MG-UNIT Tabs  Take 1 tablet by mouth 2 (two) times daily.     celecoxib 200 MG capsule  Commonly known as:  CELEBREX  Reported on 09/22/2015     ciprofloxacin 500 MG tablet  Commonly known as:  CIPRO  Take 1 tablet by mouth 2 (two) times daily.     fluticasone 50 MCG/ACT nasal spray  Commonly known as:  FLONASE  Place 1 spray into both nostrils daily.     HYDROcodone-acetaminophen 5-325 MG tablet  Commonly known as:  NORCO/VICODIN  Take 1-2 tablets by mouth every 4 (four) hours as needed for moderate pain.     loratadine 10 MG tablet  Commonly known as:  CLARITIN  Take 10 mg by mouth daily.     MULTIVITAMIN PO  Take 1 tablet by mouth daily.     omeprazole 40 MG capsule  Commonly known as:  PRILOSEC  TAKE 1 CAPSULE (40 MG TOTAL) BY MOUTH 2 (TWO) TIMES DAILY.     OVER THE COUNTER MEDICATION  Place 1 drop into both eyes daily. Reported on 01/13/2016  polyethylene glycol powder powder  Commonly known as:  GLYCOLAX/MIRALAX     propranolol 40 MG tablet  Commonly known as:  INDERAL  Take 40 mg by mouth daily. Reported on 01/13/2016     PROVENTIL HFA IN  Inhale 1 puff into the lungs as needed.     albuterol 1.25 MG/3ML nebulizer solution  Commonly known as:  ACCUNEB  Take 1 ampule by nebulization every 6 (six) hours as needed for wheezing. Reported on 09/22/2015     PULMICORT FLEXHALER 180 MCG/ACT inhaler  Generic drug:  budesonide  Inhale 2 puffs into the lungs 2 (two) times daily.     simvastatin 40 MG tablet  Commonly known as:  ZOCOR  Take 40 mg by mouth daily.     solifenacin 5 MG tablet  Commonly known as:  VESICARE  Take 5 mg by mouth daily.     topiramate 50 MG tablet  Commonly known as:  TOPAMAX  Take 50  mg by mouth daily.     VITAMIN C PO  Take 1 tablet by mouth 2 (two) times daily.     VITAMIN E PO  Take 1 capsule by mouth as needed.        Past Medical History  Diagnosis Date  . Overactive bladder     Takes Vesicare  . GERD (gastroesophageal reflux disease)   . Environmental and seasonal allergies   . Asthma     "mild"  . History of blood transfusion 06/2013    "during hospitalization"  . Migraines 1943 to present    "fairly frequently"; Takes Propranolol (01/12/2015)  . Arthritis     "knees, hands" (01/12/2015)    Past Surgical History  Procedure Laterality Date  . Chest tube insertion  January 2015    X 2 at [facility]  . Video assisted thoracoscopy (vats)/thorocotomy Left 07/13/2013    VATS with insertion of chest tubes  . Tonsillectomy and adenoidectomy  1946; 1947  . Joint replacement    . Breast biopsy Left ~ 1973  . Dilation and curettage of uterus  4-5  . 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 Cataract extraction w/ intraocular lens  implant, bilateral Bilateral 2003  . 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 Replacement total knee Left     Allergies  Allergen Reactions  . Tetanus Toxoids Swelling and Other (See Comments)    Swelling around throat and jaws  . Bee Venom Swelling and Other (See Comments)    Crusty area on skin  . Dilaudid [Hydromorphone] Nausea And Vomiting    Causes nausea and vomiting  . Erythromycin Nausea And Vomiting  . Fiorinal [Butalbital-Aspirin-Caffeine]     Altered mental status  . Ibuprofen Other (See Comments)    Blotches on skin, then skin peeling.  . Amoxicillin Other (See Comments)    Upset stomach  . Doxycycline Other (See Comments)    Upset stomach  . Latex Other (See Comments)    Skin will crack open and bleed  . Neomycin Other (See Comments)    Irritates skin  .  Penicillins Itching, Swelling and Rash    Review of systems negative except as noted in HPI / PMHx or noted below:  Review of Systems  Constitutional: Negative.   HENT: Negative.   Eyes: Negative.   Respiratory: Negative.   Cardiovascular: Negative.   Gastrointestinal: Negative.  Genitourinary: Negative.   Musculoskeletal: Negative.   Skin: Negative.   Neurological: Negative.   Endo/Heme/Allergies: Negative.   Psychiatric/Behavioral: Negative.      Objective:   Filed Vitals:   01/13/16 1547  BP: 126/80  Pulse: 100  Resp: 20          Physical Exam  Constitutional: She is well-developed, well-nourished, and in no distress.  HENT:  Head: Normocephalic.  Right Ear: Tympanic membrane, external ear and ear canal normal.  Left Ear: Tympanic membrane, external ear and ear canal normal.  Nose: Nose normal. No mucosal edema or rhinorrhea.  Mouth/Throat: Uvula is midline, oropharynx is clear and moist and mucous membranes are normal. No oropharyngeal exudate.  Eyes: Conjunctivae are normal.  Neck: Trachea normal. No tracheal tenderness present. No tracheal deviation present. No thyromegaly present.  Cardiovascular: Normal rate, regular rhythm, S1 normal, S2 normal and normal heart sounds.   No murmur heard. Pulmonary/Chest: Breath sounds normal. No stridor. No respiratory distress. She has no wheezes. She has no rales.  Musculoskeletal: She exhibits no edema.  Lymphadenopathy:       Head (right side): No tonsillar adenopathy present.       Head (left side): No tonsillar adenopathy present.    She has no cervical adenopathy.  Neurological: She is alert. Gait normal.  Skin: No rash noted. She is not diaphoretic. No erythema. Nails show no clubbing.  Psychiatric: Mood and affect normal.    Diagnostics: Review of a chest x-ray dated 10/21/2015 did not identify any significant abnormality.   Spirometry was performed and demonstrated an FEV1 of 2.18 at 117 % of  predicted.  Oxygen saturation at rest on room air was 96%. While walking up and down the hall several times per oxygen saturation on room air did not change  The patient had an Asthma Control Test with the following results:  .    Assessment and Plan:   1. Moderate persistent asthma, uncomplicated   2. Other allergic rhinitis   3. Laryngopharyngeal reflux (LPR)   4. Weight loss   5. Early satiety   6. Nephropathy     1. Continue Pulmicort 180- 2 inhalations one-2 times a day depending on disease activity    2. Continue Flonase one spray each nostril 1-2 times per day depending on disease activity  3. Stop omeprazole and start ranitidine 150 mg twice a day  4. Review results of blood tests from Dr. Marina Martin  5. Arrange to have upper endoscopy performed by gastroenterologist  6. Continue ProAir HFA and albuterol nebulization and Claritin and nasal saline if needed  5. Return to clinic in 2 weeks or earlier if problem  I'm going to have Jazmin stop her proton pump inhibitor given the fact that she apparently has some type of nephropathy and she can use a H2 receptor blocker to replace this medication. Her early satiety needs to be evaluated with an upper endoscopy especially given her history of developing problems with weight loss. We'll see if we can get that arranged as soon as possible. It does appear that she had a recent sinus infection which has cleared with therapy prescribed by Dr. Marina Martin and I see no need for any further evaluation regarding this issue. As well, we'll just keep her on her inhaled steroid at this point until we can get a little bit more information about some of her other issues. I'll see her back in his clinic in 2 weeks or earlier if there is a problem.  Allena Katz, MD [place]

## 2016-01-14 MED ORDER — RANITIDINE HCL 150 MG PO TABS: 150.0000 mg | ORAL_TABLET | Freq: Two times a day (BID) | ORAL | Status: DC

## 2016-01-14 MED ORDER — FLUTICASONE FUROATE 200 MCG/ACT IN AEPB
1.0000 | INHALATION_SPRAY | Freq: Every day | RESPIRATORY_TRACT | Status: DC
Start: 2016-01-14 — End: 2017-03-29

## 2016-01-17 DIAGNOSIS — R634 Abnormal weight loss: Secondary | ICD-10-CM | POA: Diagnosis not present

## 2016-01-17 DIAGNOSIS — Z6827 Body mass index (BMI) 27.0-27.9, adult: Secondary | ICD-10-CM | POA: Diagnosis not present

## 2016-01-17 DIAGNOSIS — N183 Chronic kidney disease, stage 3 (moderate): Secondary | ICD-10-CM | POA: Diagnosis not present

## 2016-01-18 DIAGNOSIS — K219 Gastro-esophageal reflux disease without esophagitis: Secondary | ICD-10-CM | POA: Diagnosis not present

## 2016-01-18 DIAGNOSIS — R6881 Early satiety: Secondary | ICD-10-CM | POA: Diagnosis not present

## 2016-01-19 DIAGNOSIS — R634 Abnormal weight loss: Secondary | ICD-10-CM | POA: Diagnosis not present

## 2016-01-19 DIAGNOSIS — M549 Dorsalgia, unspecified: Secondary | ICD-10-CM | POA: Diagnosis not present

## 2016-01-25 DIAGNOSIS — M1710 Unilateral primary osteoarthritis, unspecified knee: Secondary | ICD-10-CM | POA: Diagnosis not present

## 2016-01-25 DIAGNOSIS — K219 Gastro-esophageal reflux disease without esophagitis: Secondary | ICD-10-CM | POA: Diagnosis not present

## 2016-01-25 DIAGNOSIS — Z6829 Body mass index (BMI) 29.0-29.9, adult: Secondary | ICD-10-CM | POA: Diagnosis not present

## 2016-01-25 DIAGNOSIS — N189 Chronic kidney disease, unspecified: Secondary | ICD-10-CM | POA: Diagnosis not present

## 2016-01-25 DIAGNOSIS — N179 Acute kidney failure, unspecified: Secondary | ICD-10-CM | POA: Diagnosis not present

## 2016-01-25 DIAGNOSIS — E785 Hyperlipidemia, unspecified: Secondary | ICD-10-CM | POA: Diagnosis not present

## 2016-01-27 ENCOUNTER — Ambulatory Visit (INDEPENDENT_AMBULATORY_CARE_PROVIDER_SITE_OTHER): Payer: Medicare Other | Admitting: Allergy and Immunology

## 2016-01-27 ENCOUNTER — Encounter: Payer: Self-pay | Admitting: Allergy and Immunology

## 2016-01-27 VITALS — BP 142/70 | HR 72 | Resp 16

## 2016-01-27 DIAGNOSIS — J3089 Other allergic rhinitis: Secondary | ICD-10-CM | POA: Diagnosis not present

## 2016-01-27 DIAGNOSIS — R634 Abnormal weight loss: Secondary | ICD-10-CM | POA: Diagnosis not present

## 2016-01-27 DIAGNOSIS — R6881 Early satiety: Secondary | ICD-10-CM

## 2016-01-27 DIAGNOSIS — J387 Other diseases of larynx: Secondary | ICD-10-CM

## 2016-01-27 DIAGNOSIS — N289 Disorder of kidney and ureter, unspecified: Secondary | ICD-10-CM | POA: Diagnosis not present

## 2016-01-27 DIAGNOSIS — J4530 Mild persistent asthma, uncomplicated: Secondary | ICD-10-CM | POA: Diagnosis not present

## 2016-01-27 DIAGNOSIS — K219 Gastro-esophageal reflux disease without esophagitis: Secondary | ICD-10-CM

## 2016-01-27 NOTE — Progress Notes (Signed)
Follow-up Note  Referring Provider: Abigail Miyamoto,* Primary Provider: Abigail Miyamoto, MD Date of Office Visit: 01/27/2016  Subjective:   Shannon Martin (DOB: 1940/07/14) is a 75 y.o. female [facility] in re-evaluation of the following:  HPI: Shannon Martin returns to this clinic in reevaluation of multiple issues that were addressed approximately 2 weeks ago. At that point in time we addressed her breathing issue by having her start a different form of inhaled steroid, made an attempt to address the issue with weight loss and early satiety by referring her to a gastroenterologist, and eliminated her proton pump inhibitor and replaced it with ranitidine secondary to a apparent nephropathy.  Since that visit she feels good. Her breathing is much better. She can exert herself without much difficulty at this point in time. She does not use a short acting bronchodilator.  Her eating is better. She still gets this "revulsion" when she thinks about eating meat but she has been better at eating small meals several times per day and her weight may have stabilized somewhat. She still does fill pretty fast with early satiety. She did visit with Dr. Charm Barges but there was a interaction that didn't go very well and she is not going to be returning to see Dr. Charm Barges for an upper endoscopy or colonoscopy.  While converting from a proton pump inhibitor twice a day to an H2 receptor blocker twice a day her reflux is under excellent control. She did visit with a nephrologist on Tuesday.    Medication List      ARTIFICIAL TEARS OP Apply 1 drop to eye as needed.   Calcium + D3 600-200 MG-UNIT Tabs Take 1 tablet by mouth 2 (two) times daily.   celecoxib 200 MG capsule Commonly known as:  CELEBREX Reported on 09/22/2015   fluticasone 50 MCG/ACT nasal spray Commonly known as:  FLONASE Place 1 spray into both nostrils daily.   Fluticasone Furoate  200 MCG/ACT Aepb Commonly known as:  ARNUITY ELLIPTA Inhale 1 Dose into the lungs daily.   loratadine 10 MG tablet Commonly known as:  CLARITIN Take 10 mg by mouth daily.   MULTIVITAMIN PO Take 1 tablet by mouth daily.   OVER THE COUNTER MEDICATION Place 1 drop into both eyes daily. Reported on 01/13/2016   polyethylene glycol powder powder Commonly known as:  GLYCOLAX/MIRALAX   propranolol 40 MG tablet Commonly known as:  INDERAL Take 40 mg by mouth daily. Reported on 01/13/2016   ranitidine 150 MG tablet Commonly known as:  ZANTAC Take 1 tablet (150 mg total) by mouth 2 (two) times daily.   simvastatin 40 MG tablet Commonly known as:  ZOCOR Take 40 mg by mouth daily.   VITAMIN C PO Take 1 tablet by mouth 2 (two) times daily.   VITAMIN E PO Take 1 capsule by mouth as needed.       Past Medical History:  Diagnosis Date  . Arthritis    "knees, hands" (01/12/2015)  . Asthma    "mild"  . Environmental and seasonal allergies   . GERD (gastroesophageal reflux disease)   . History of blood transfusion 06/2013   "during hospitalization"  . Migraines 1943 to present   "fairly frequently"; Takes Propranolol (01/12/2015)  . Overactive bladder    Takes Vesicare    Past Surgical History:  Procedure Laterality Date  . BREAST BIOPSY Left ~ 1973  . CATARACT EXTRACTION W/ INTRAOCULAR LENS  IMPLANT,  BILATERAL Bilateral 2003  . CHEST TUBE INSERTION  January 2015   X 2 at [facility]  . DILATION AND CURETTAGE OF UTERUS  4-5  . JOINT REPLACEMENT    . REPLACEMENT TOTAL KNEE Left   . TONSILLECTOMY AND ADENOIDECTOMY  1946; 1947  . TOTAL KNEE ARTHROPLASTY Left 01/11/2015  . TOTAL KNEE ARTHROPLASTY Left 01/11/2015   Procedure: Left TOTAL KNEE ARTHROPLASTY;  Surgeon: Dannielle Huh, MD;  Location: [facility] OR;  Service: Orthopedics;  Laterality: Left;  Marland Kitchen VIDEO ASSISTED THORACOSCOPY (VATS)/THOROCOTOMY Left 07/13/2013   VATS with insertion of chest tubes    Allergies  Allergen  Reactions  . Tetanus Toxoids Swelling and Other (See Comments)    Swelling around throat and jaws  . Bee Venom Swelling and Other (See Comments)    Crusty area on skin  . Hornet Venom Swelling    Other reaction(s): Other (See Comments) Crusty area on skin  . Dilaudid [Hydromorphone] Nausea And Vomiting    Causes nausea and vomiting  . Erythromycin Nausea And Vomiting  . Fiorinal [Butalbital-Aspirin-Caffeine]     Altered mental status  . Ibuprofen Other (See Comments)    Blotches on skin, then skin peeling.  . Amoxicillin Other (See Comments)    Upset stomach  . Doxycycline Other (See Comments)    Upset stomach  . Latex Other (See Comments)    Skin will crack open and bleed  . Neomycin Other (See Comments)    Irritates skin  . Penicillins Itching, Swelling and Rash    Review of systems negative except as noted in HPI / PMHx or noted below:  Review of Systems  Constitutional: Negative.   HENT: Negative.   Eyes: Negative.   Respiratory: Negative.   Cardiovascular: Negative.   Gastrointestinal: Negative.   Genitourinary: Negative.   Musculoskeletal: Negative.   Skin: Negative.   Neurological: Negative.   Endo/Heme/Allergies: Negative.   Psychiatric/Behavioral: Negative.      Objective:   Vitals:   01/27/16 1111  BP: (!) 142/70  Pulse: 72  Resp: 16          Physical Exam  Constitutional: She is well-developed, well-nourished, and in no distress.  HENT:  Head: Normocephalic.  Right Ear: Tympanic membrane, external ear and ear canal normal.  Left Ear: Tympanic membrane, external ear and ear canal normal.  Nose: Nose normal. No mucosal edema or rhinorrhea.  Mouth/Throat: Uvula is midline, oropharynx is clear and moist and mucous membranes are normal. No oropharyngeal exudate.  Eyes: Conjunctivae are normal.  Neck: Trachea normal. No tracheal tenderness present. No tracheal deviation present. No thyromegaly present.  Cardiovascular: Normal rate, regular rhythm,  S1 normal, S2 normal and normal heart sounds.   No murmur heard. Pulmonary/Chest: Breath sounds normal. No stridor. No respiratory distress. She has no wheezes. She has no rales.  Musculoskeletal: She exhibits no edema.  Lymphadenopathy:       Head (right side): No tonsillar adenopathy present.       Head (left side): No tonsillar adenopathy present.    She has no cervical adenopathy.  Neurological: She is alert. Gait normal.  Skin: No rash noted. She is not diaphoretic. No erythema. Nails show no clubbing.  Psychiatric: Mood and affect normal.    Diagnostics:    Spirometry was performed and demonstrated an FEV1 of 2.0 at 98 % of predicted.  The patient had an Asthma Control Test with the following results:  .    Assessment and Plan:   1. Asthma, well controlled, mild  persistent   2. Other allergic rhinitis   3. Laryngopharyngeal reflux (LPR)   4. Weight loss   5. Early satiety   6. Nephropathy     1. Arnuity 200 one inhalation once daily   2. Continue Flonase one spray each nostril 1-2 times per day depending on disease activity  3. Continue ranitidine 150 mg twice a day  4. Arrange to have upper endoscopy performed by gastroenterologist as directed by Dr. Marina Goodell  5. Continue ProAir HFA and albuterol nebulization and Claritin and nasal saline if needed  6. Return to clinic in November 2017 or earlier if problem. Obtain flu vaccine this fall.  Ameisha is better regarding her respiratory tract and her reflux control appears to be very good while using an H2 receptor blocker as a sole antireflux therapy. I've informed Terrie that she does need to follow-up with Dr. Marina Goodell concerning further evaluation and treatment of her weight loss and early satiety and of course she will follow up with her nephrologist concerning further management of her apparent nephropathy. I will see her back in this clinic in November 2017 or earlier if there is a problem. She'll obtain a flu vaccine during  the interval.  Laurette Schimke, MD [facility]

## 2016-01-27 NOTE — Patient Instructions (Addendum)
  1. Arnuity 200 one inhalation once daily   2. Continue Flonase one spray each nostril 1-2 times per day depending on disease activity  3. Continue ranitidine 150 mg twice a day  4. Arrange to have upper endoscopy performed by gastroenterologist as directed by Dr. Marina Goodell  5. Continue ProAir HFA and albuterol nebulization and Claritin and nasal saline if needed  6. Return to clinic in November 2017 or earlier if problem

## 2016-01-28 MED ORDER — ALBUTEROL SULFATE HFA 108 (90 BASE) MCG/ACT IN AERS: INHALATION_SPRAY | RESPIRATORY_TRACT | 1 refills | Status: DC

## 2016-02-14 DIAGNOSIS — M4806 Spinal stenosis, lumbar region: Secondary | ICD-10-CM | POA: Diagnosis not present

## 2016-02-14 DIAGNOSIS — E785 Hyperlipidemia, unspecified: Secondary | ICD-10-CM | POA: Diagnosis not present

## 2016-02-14 DIAGNOSIS — M4626 Osteomyelitis of vertebra, lumbar region: Secondary | ICD-10-CM | POA: Diagnosis not present

## 2016-02-14 DIAGNOSIS — I10 Essential (primary) hypertension: Secondary | ICD-10-CM | POA: Diagnosis not present

## 2016-02-14 DIAGNOSIS — R634 Abnormal weight loss: Secondary | ICD-10-CM | POA: Diagnosis not present

## 2016-02-14 DIAGNOSIS — Z6830 Body mass index (BMI) 30.0-30.9, adult: Secondary | ICD-10-CM | POA: Diagnosis not present

## 2016-03-22 DIAGNOSIS — I10 Essential (primary) hypertension: Secondary | ICD-10-CM | POA: Diagnosis not present

## 2016-03-22 DIAGNOSIS — N183 Chronic kidney disease, stage 3 (moderate): Secondary | ICD-10-CM | POA: Diagnosis not present

## 2016-03-22 DIAGNOSIS — G43909 Migraine, unspecified, not intractable, without status migrainosus: Secondary | ICD-10-CM | POA: Diagnosis not present

## 2016-03-22 DIAGNOSIS — Z23 Encounter for immunization: Secondary | ICD-10-CM | POA: Diagnosis not present

## 2016-03-22 DIAGNOSIS — E785 Hyperlipidemia, unspecified: Secondary | ICD-10-CM | POA: Diagnosis not present

## 2016-03-22 DIAGNOSIS — K219 Gastro-esophageal reflux disease without esophagitis: Secondary | ICD-10-CM | POA: Diagnosis not present

## 2016-03-27 ENCOUNTER — Ambulatory Visit: Payer: Medicare Other | Admitting: Allergy and Immunology

## 2016-04-20 ENCOUNTER — Ambulatory Visit: Payer: Medicare Other | Admitting: Allergy and Immunology

## 2016-09-11 ENCOUNTER — Other Ambulatory Visit: Payer: Self-pay | Admitting: Allergy and Immunology

## 2016-09-11 NOTE — Telephone Encounter (Signed)
Patient needs office visit.  

## 2016-09-20 DIAGNOSIS — G43009 Migraine without aura, not intractable, without status migrainosus: Secondary | ICD-10-CM | POA: Diagnosis not present

## 2016-09-20 DIAGNOSIS — K210 Gastro-esophageal reflux disease with esophagitis: Secondary | ICD-10-CM | POA: Diagnosis not present

## 2016-09-20 DIAGNOSIS — Z205 Contact with and (suspected) exposure to viral hepatitis: Secondary | ICD-10-CM | POA: Diagnosis not present

## 2016-09-20 DIAGNOSIS — E782 Mixed hyperlipidemia: Secondary | ICD-10-CM | POA: Diagnosis not present

## 2016-09-20 DIAGNOSIS — N3941 Urge incontinence: Secondary | ICD-10-CM | POA: Diagnosis not present

## 2016-09-20 DIAGNOSIS — J4520 Mild intermittent asthma, uncomplicated: Secondary | ICD-10-CM | POA: Diagnosis not present

## 2016-09-20 DIAGNOSIS — M150 Primary generalized (osteo)arthritis: Secondary | ICD-10-CM | POA: Diagnosis not present

## 2016-09-28 ENCOUNTER — Encounter: Payer: Self-pay | Admitting: Allergy and Immunology

## 2016-09-28 ENCOUNTER — Ambulatory Visit (INDEPENDENT_AMBULATORY_CARE_PROVIDER_SITE_OTHER): Payer: Medicare HMO | Admitting: Allergy and Immunology

## 2016-09-28 VITALS — BP 138/84 | HR 80 | Resp 20

## 2016-09-28 DIAGNOSIS — K219 Gastro-esophageal reflux disease without esophagitis: Secondary | ICD-10-CM | POA: Diagnosis not present

## 2016-09-28 DIAGNOSIS — J4530 Mild persistent asthma, uncomplicated: Secondary | ICD-10-CM

## 2016-09-28 DIAGNOSIS — J3089 Other allergic rhinitis: Secondary | ICD-10-CM | POA: Diagnosis not present

## 2016-09-28 NOTE — Progress Notes (Signed)
Follow-up Note  Referring Provider: Abigail Miyamoto,* Primary Provider: Abigail Miyamoto, MD Date of Office Visit: 09/28/2016  Subjective:   Shannon Martin (DOB: 03-Aug-1940) is a 76 y.o. female who [facility] in re-evaluation of the following:  HPI: Shannon Martin returns to this clinic in reevaluation of her asthma, history of empyema, allergic rhinitis, and reflux. I have not seen her in this clinic since August 2017. At that point in time she was dealing with other issues besides her respiratory tract issues including the fact that she was losing weight and had early satiety and had a nephropathy that was treated with elimination of her proton pump inhibitor.  Overall she's really done quite well since I last seen her in his clinic. She has not required a systemic steroid or an antibiotic to treat any type of respiratory tract issue. She rarely uses a short acting bronchodilator and she can exercise without any difficulty. She has not had any chest pain.  Her nose has not been causing her any problem. She is now gaining weight and has resolved the issue with her early satiety. In the past whenever she thought about eating she got a "revulsion" but now that it  is only reserved for exposure to cooked chicken. Her kidney status is quite stable at this point in time. She has not restarted her proton pump inhibitor but she does not believe that she's been having any problems with reflux or her throat while using an H2 receptor blocker.  She did obtain the flu vaccine this year.  Allergies as of 09/28/2016      Reactions   Tetanus Toxoids Swelling, Other (See Comments)   Swelling around throat and jaws   Bee Venom Swelling, Other (See Comments)   Crusty area on skin   Hornet Venom Swelling   Other reaction(s): Other (See Comments) Crusty area on skin   Dilaudid [hydromorphone] Nausea And Vomiting   Causes nausea and vomiting   Erythromycin  Nausea And Vomiting   Fiorinal [butalbital-aspirin-caffeine]    Altered mental status   Ibuprofen Other (See Comments)   Blotches on skin, then skin peeling.   Amoxicillin Other (See Comments)   Upset stomach   Doxycycline Other (See Comments)   Upset stomach   Latex Other (See Comments)   Skin will crack open and bleed   Neomycin Other (See Comments)   Irritates skin   Penicillins Itching, Swelling, Rash      Medication List      albuterol 108 (90 Base) MCG/ACT inhaler Commonly known as:  PROVENTIL HFA;VENTOLIN HFA Inhale two puffs every four to six hours as needed for cough or wheeze.   ARTIFICIAL TEARS OP Apply 1 drop to eye as needed.   Calcium + D3 600-200 MG-UNIT Tabs Take 1 tablet by mouth 2 (two) times daily.   celecoxib 200 MG capsule Commonly known as:  CELEBREX Reported on 09/22/2015   fluticasone 50 MCG/ACT nasal spray Commonly known as:  FLONASE Place 1 spray into both nostrils daily.   Fluticasone Furoate 200 MCG/ACT Aepb Commonly known as:  ARNUITY ELLIPTA Inhale 1 Dose into the lungs daily.   loratadine 10 MG tablet Commonly known as:  CLARITIN Take 10 mg by mouth daily.   MULTIVITAMIN PO Take 1 tablet by mouth daily.   polyethylene glycol powder powder Commonly known as:  GLYCOLAX/MIRALAX   propranolol 40 MG tablet Commonly known as:  INDERAL Take 40 mg by mouth  daily. Reported on 01/13/2016   ranitidine 150 MG tablet Commonly known as:  ZANTAC TAKE 1 TABLET(150 MG) BY MOUTH TWICE DAILY   simvastatin 40 MG tablet Commonly known as:  ZOCOR Take 40 mg by mouth daily.   VESICARE 5 MG tablet Generic drug:  solifenacin Take 1 tablet by mouth daily.   VITAMIN C PO Take 1 tablet by mouth 2 (two) times daily.   VITAMIN E PO Take 1 capsule by mouth as needed.       Past Medical History:  Diagnosis Date  . Arthritis    "knees, hands" (01/12/2015)  . Asthma    "mild"  . Environmental and seasonal allergies   . GERD  (gastroesophageal reflux disease)   . History of blood transfusion 06/2013   "during hospitalization"  . Migraines 1943 to present   "fairly frequently"; Takes Propranolol (01/12/2015)  . Overactive bladder    Takes Vesicare    Past Surgical History:  Procedure Laterality Date  . BREAST BIOPSY Left ~ 1973  . CATARACT EXTRACTION W/ INTRAOCULAR LENS  IMPLANT, BILATERAL Bilateral 2003  . CHEST TUBE INSERTION  January 2015   X 2 at [facility]  . DILATION AND CURETTAGE OF UTERUS  4-5  . JOINT REPLACEMENT    . REPLACEMENT TOTAL KNEE Left   . TONSILLECTOMY AND ADENOIDECTOMY  1946; 1947  . TOTAL KNEE ARTHROPLASTY Left 01/11/2015  . TOTAL KNEE ARTHROPLASTY Left 01/11/2015   Procedure: Left TOTAL KNEE ARTHROPLASTY;  Surgeon: Dannielle Huh, MD;  Location: [facility] OR;  Service: Orthopedics;  Laterality: Left;  Marland Kitchen VIDEO ASSISTED THORACOSCOPY (VATS)/THOROCOTOMY Left 07/13/2013   VATS with insertion of chest tubes    Review of systems negative except as noted in HPI / PMHx or noted below:  Review of Systems  Constitutional: Negative.   HENT: Negative.   Eyes: Negative.   Respiratory: Negative.   Cardiovascular: Negative.   Gastrointestinal: Negative.   Genitourinary: Negative.   Musculoskeletal: Negative.   Skin: Negative.   Neurological: Negative.   Endo/Heme/Allergies: Negative.   Psychiatric/Behavioral: Negative.      Objective:   Vitals:   09/28/16 1030  BP: 138/84  Pulse: 80  Resp: 20          Physical Exam  Constitutional: She is well-developed, well-nourished, and in no distress.  HENT:  Head: Normocephalic.  Right Ear: Tympanic membrane, external ear and ear canal normal.  Left Ear: Tympanic membrane, external ear and ear canal normal.  Nose: Nose normal. No mucosal edema or rhinorrhea.  Mouth/Throat: Uvula is midline, oropharynx is clear and moist and mucous membranes are normal. No oropharyngeal exudate.  Eyes: Conjunctivae are normal.  Neck: Trachea normal. No  tracheal tenderness present. No tracheal deviation present. No thyromegaly present.  Cardiovascular: Normal rate, regular rhythm, S1 normal, S2 normal and normal heart sounds.   No murmur heard. Pulmonary/Chest: Breath sounds normal. No stridor. No respiratory distress. She has no wheezes. She has no rales.  Musculoskeletal: She exhibits no edema.  Lymphadenopathy:       Head (right side): No tonsillar adenopathy present.       Head (left side): No tonsillar adenopathy present.    She has no cervical adenopathy.  Neurological: She is alert. Gait normal.  Skin: No rash noted. She is not diaphoretic. No erythema. Nails show no clubbing.  Psychiatric: Mood and affect normal.    Diagnostics:    Spirometry was performed and demonstrated an FEV1 of 1.91 at 93 % of predicted.  The patient  had an Asthma Control Test with the following results: ACT Total Score: 24.    Assessment and Plan:   1. Asthma, well controlled, mild persistent   2. Other allergic rhinitis   3. Laryngopharyngeal reflux (LPR)     1. Continue Arnuity 200 one inhalation once daily   2. Continue Flonase one spray each nostril 1-2 times per day depending on disease activity  3. Continue ranitidine 150 mg twice a day  4. Continue ProAir HFA and albuterol nebulization and Claritin and nasal saline if needed  6. Return to clinic in December 2018 or earlier if problem  Overall, Arrion has really done quite well on her current medical therapy and we will continue to have her use a inhaled steroid and a nasal steroid and a H2 receptor blocker to treat her inflammatory respiratory condition and reflux-induced respiratory disease respectively. She will return to this clinic should she have significant problems but otherwise I'll just see her back in this clinic in December 2018  Laurette Schimke, MD Allergy / Immunology [facility]

## 2016-09-28 NOTE — Patient Instructions (Signed)
  1. Continue Arnuity 200 one inhalation once daily   2. Continue Flonase one spray each nostril 1-2 times per day depending on disease activity  3. Continue ranitidine 150 mg twice a day  4. Continue ProAir HFA and albuterol nebulization and Claritin and nasal saline if needed  6. Return to clinic in December 2018 or earlier if problem

## 2016-12-07 IMAGING — CT CT KNEE*L* W/O CM
2 of 5 series · 6 of 14 positions shown, 7 images · non-contrast
Comparison: None.

CLINICAL DATA: Preoperative planning study, MEDACTA protocol.
Patient for left knee replacement.

EXAM:
CT OF THE LEFT KNEE, HIP AND ANKLE WITHOUT CONTRAST
TECHNIQUE: Multidetector CT imaging of the left knee was performed according to
the standard protocol. Multiplanar CT image reconstructions were
also generated. Axial images through the left hip and knee are also
provided without multi planar reconstructions.

[Series 5: knee soft (id) · axial · 0.37mm/px · z∈[-532,-394]mm · 3 of 111 slices shown]
[im 28/111  soft-tissue]
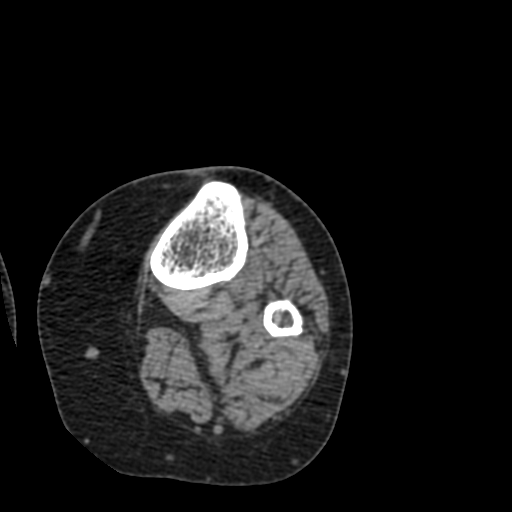
[im 56/111  soft-tissue]
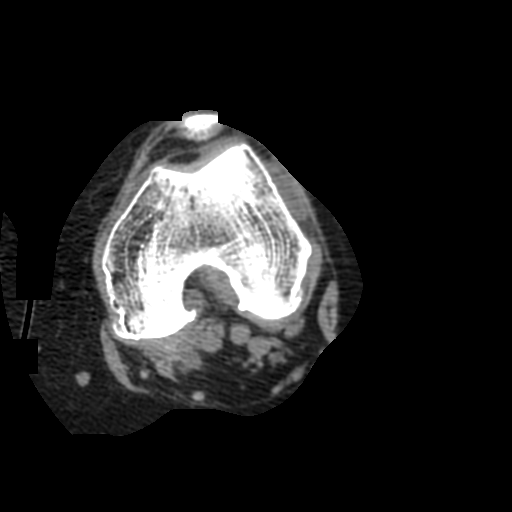
[im 83/111  soft-tissue]
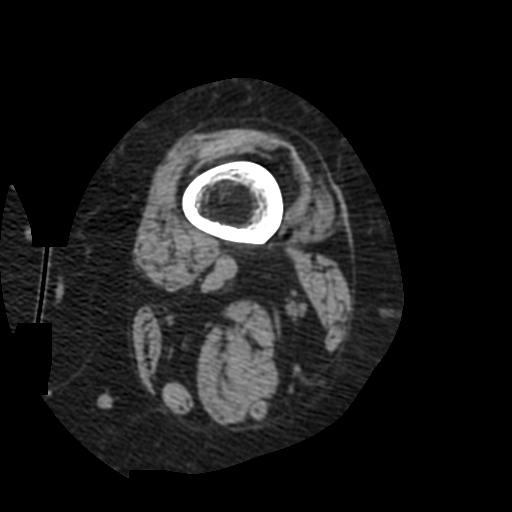

[Series 600: sag · axial · 0.37mm/px · z∈[-462,-321]mm · 3 of 72 slices shown, 4 images]
[im 1/72  soft-tissue]
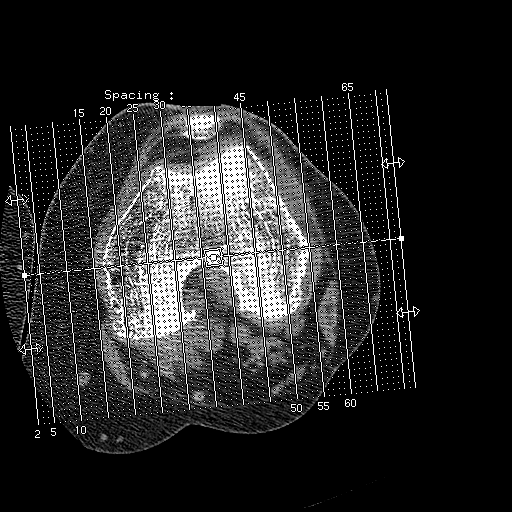
[im 1/72  bone]
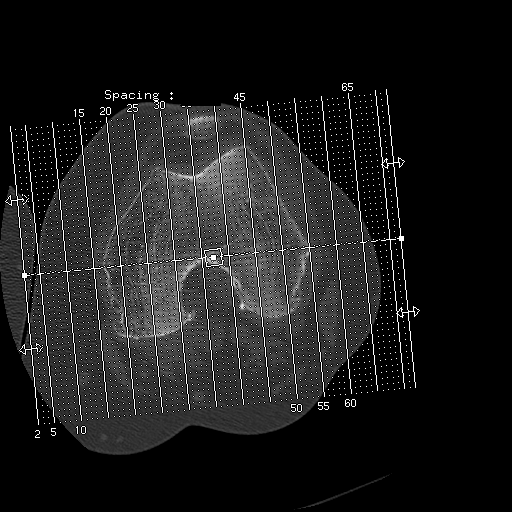
[im 36/72  bone]
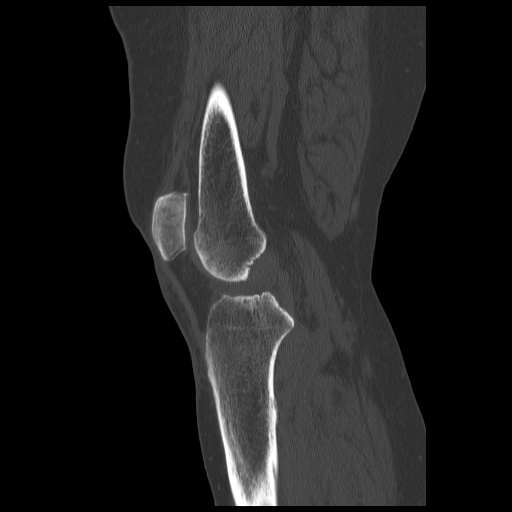
[im 72/72  bone]
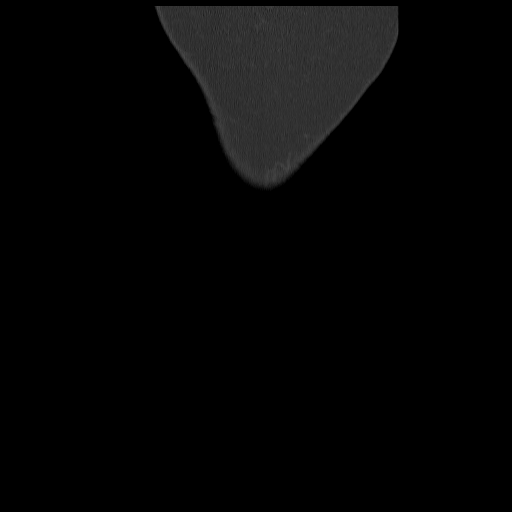

[6 of 14 positions shown; findings below may reference images not displayed]

FINDINGS: The patient has tricompartmental osteoarthritis about the left knee
appearing worst in the medial compartment where there is near
bone-on-bone joint space narrowing. No fracture or focal bony lesion
is identified. There is no joint effusion. Limited imaging of the
right hand and ankle is unremarkable.
IMPRESSION: Tricompartmental osteoarthritis left knee, worst medially. No acute
abnormality.

## 2016-12-14 ENCOUNTER — Other Ambulatory Visit: Payer: Self-pay | Admitting: Allergy and Immunology

## 2017-01-09 DIAGNOSIS — R69 Illness, unspecified: Secondary | ICD-10-CM | POA: Diagnosis not present

## 2017-02-06 DIAGNOSIS — H9201 Otalgia, right ear: Secondary | ICD-10-CM | POA: Diagnosis not present

## 2017-02-06 DIAGNOSIS — R69 Illness, unspecified: Secondary | ICD-10-CM | POA: Diagnosis not present

## 2017-02-06 DIAGNOSIS — H6593 Unspecified nonsuppurative otitis media, bilateral: Secondary | ICD-10-CM | POA: Diagnosis not present

## 2017-03-23 DIAGNOSIS — Z23 Encounter for immunization: Secondary | ICD-10-CM | POA: Diagnosis not present

## 2017-03-23 DIAGNOSIS — G43009 Migraine without aura, not intractable, without status migrainosus: Secondary | ICD-10-CM | POA: Diagnosis not present

## 2017-03-23 DIAGNOSIS — M150 Primary generalized (osteo)arthritis: Secondary | ICD-10-CM | POA: Diagnosis not present

## 2017-03-23 DIAGNOSIS — J4520 Mild intermittent asthma, uncomplicated: Secondary | ICD-10-CM | POA: Diagnosis not present

## 2017-03-23 DIAGNOSIS — E782 Mixed hyperlipidemia: Secondary | ICD-10-CM | POA: Diagnosis not present

## 2017-03-23 DIAGNOSIS — K210 Gastro-esophageal reflux disease with esophagitis: Secondary | ICD-10-CM | POA: Diagnosis not present

## 2017-03-23 DIAGNOSIS — N3941 Urge incontinence: Secondary | ICD-10-CM | POA: Diagnosis not present

## 2017-03-29 ENCOUNTER — Ambulatory Visit (INDEPENDENT_AMBULATORY_CARE_PROVIDER_SITE_OTHER): Payer: Medicare HMO | Admitting: Allergy and Immunology

## 2017-03-29 ENCOUNTER — Encounter: Payer: Self-pay | Admitting: Allergy and Immunology

## 2017-03-29 VITALS — BP 130/76 | HR 66 | Resp 17 | Ht 61.0 in | Wt 177.2 lb

## 2017-03-29 DIAGNOSIS — J3089 Other allergic rhinitis: Secondary | ICD-10-CM | POA: Diagnosis not present

## 2017-03-29 DIAGNOSIS — K219 Gastro-esophageal reflux disease without esophagitis: Secondary | ICD-10-CM | POA: Diagnosis not present

## 2017-03-29 DIAGNOSIS — J4530 Mild persistent asthma, uncomplicated: Secondary | ICD-10-CM | POA: Diagnosis not present

## 2017-03-29 MED ORDER — FLUTICASONE FUROATE 200 MCG/ACT IN AEPB: 1.0000 | INHALATION_SPRAY | Freq: Every day | RESPIRATORY_TRACT | 5 refills | Status: DC

## 2017-03-29 NOTE — Patient Instructions (Addendum)
  1. Continue Arnuity 200 one inhalation once daily   2. Continue Flonase one spray each nostril 1-2 times per day depending on disease activity  3. Continue ranitidine 150 mg twice a day  4. Continue ProAir HFA and albuterol nebulization and Claritin and nasal saline if needed  6. Return to clinic in 6 months or earlier if problem  7. Obtain fall flu vaccine

## 2017-03-29 NOTE — Progress Notes (Signed)
Follow-up Note  Referring Provider: Abigail Miyamoto,* Primary Provider: Abigail Miyamoto, MD Date of Office Visit: 03/29/2017  Subjective:   Shannon Martin (DOB: 11/12/1940) is a 76 y.o. female [facility] in re-evaluation of the following:  HPI: Shannon Martin returns to this clinic in reevaluation of her asthma, history of empyema, allergic rhinitis, and reflux. Her last visit to this clinic was April 2018.  During the interval she has not required a systemic steroid or an antibiotic to treat any type of respiratory tract issue. She does not use a short acting bronchodilator. She has had very little issues with her nose. Her throat is doing quite well and she has had no issues with reflux as long as she is very careful about remaining away from caffeine.  Allergies as of 03/29/2017      Reactions   Tetanus Toxoids Swelling, Other (See Comments)   Swelling around throat and jaws   Bee Venom Swelling, Other (See Comments)   Crusty area on skin   Hornet Venom Swelling   Other reaction(s): Other (See Comments) Crusty area on skin   Dilaudid [hydromorphone] Nausea And Vomiting   Causes nausea and vomiting   Erythromycin Nausea And Vomiting   Fiorinal [butalbital-aspirin-caffeine]    Altered mental status   Ibuprofen Other (See Comments)   Blotches on skin, then skin peeling.   Amoxicillin Other (See Comments)   Upset stomach   Doxycycline Other (See Comments)   Upset stomach   Latex Other (See Comments)   Skin will crack open and bleed   Neomycin Other (See Comments)   Irritates skin   Penicillins Itching, Swelling, Rash      Medication List      albuterol 108 (90 Base) MCG/ACT inhaler Commonly known as:  PROVENTIL HFA;VENTOLIN HFA Inhale two puffs every four to six hours as needed for cough or wheeze.   ARTIFICIAL TEARS OP Apply 1 drop to eye as needed.   Calcium + D3 600-200 MG-UNIT Tabs Take 1 tablet by mouth  2 (two) times daily.   celecoxib 200 MG capsule Commonly known as:  CELEBREX Reported on 09/22/2015   fluticasone 50 MCG/ACT nasal spray Commonly known as:  FLONASE Place 1 spray into both nostrils daily.   Fluticasone Furoate 200 MCG/ACT Aepb Commonly known as:  ARNUITY ELLIPTA Inhale 1 Dose into the lungs daily.   HYDROcodone-acetaminophen 5-325 MG tablet Commonly known as:  NORCO/VICODIN TK 1 T PO Q 4 TO 6 H PRN P   ibuprofen 600 MG tablet Commonly known as:  ADVIL,MOTRIN TK 1 T PO  Q 6 H PRN P   loratadine 10 MG tablet Commonly known as:  CLARITIN Take 10 mg by mouth daily.   MULTIVITAMIN PO Take 1 tablet by mouth daily.   polyethylene glycol powder powder Commonly known as:  GLYCOLAX/MIRALAX   propranolol 40 MG tablet Commonly known as:  INDERAL Take 40 mg by mouth daily. Reported on 01/13/2016   ranitidine 150 MG tablet Commonly known as:  ZANTAC TAKE 1 TABLET BY MOUTH TWICE DAILY   simvastatin 40 MG tablet Commonly known as:  ZOCOR Take 40 mg by mouth daily.   VESICARE 5 MG tablet Generic drug:  solifenacin Take 1 tablet by mouth daily.   VITAMIN C PO Take 1 tablet by mouth 2 (two) times daily.   VITAMIN E PO Take 1 capsule by mouth as needed.       Past  Medical History:  Diagnosis Date  . Arthritis    "knees, hands" (01/12/2015)  . Asthma    "mild"  . Environmental and seasonal allergies   . GERD (gastroesophageal reflux disease)   . History of blood transfusion 06/2013   "during hospitalization"  . Migraines 1943 to present   "fairly frequently"; Takes Propranolol (01/12/2015)  . Overactive bladder    Takes Vesicare    Past Surgical History:  Procedure Laterality Date  . BREAST BIOPSY Left ~ 1973  . CATARACT EXTRACTION W/ INTRAOCULAR LENS  IMPLANT, BILATERAL Bilateral 2003  . CHEST TUBE INSERTION  January 2015   X 2 at [facility]  . DILATION AND CURETTAGE OF UTERUS  4-5  . JOINT REPLACEMENT    . REPLACEMENT TOTAL KNEE Left     . TONSILLECTOMY AND ADENOIDECTOMY  1946; 1947  . TOTAL KNEE ARTHROPLASTY Left 01/11/2015  . TOTAL KNEE ARTHROPLASTY Left 01/11/2015   Procedure: Left TOTAL KNEE ARTHROPLASTY;  Surgeon: Dannielle Huh, MD;  Location: [facility] OR;  Service: Orthopedics;  Laterality: Left;  Marland Kitchen VIDEO ASSISTED THORACOSCOPY (VATS)/THOROCOTOMY Left 07/13/2013   VATS with insertion of chest tubes    Review of systems negative except as noted in HPI / PMHx or noted below:  Review of Systems  Constitutional: Negative.   HENT: Negative.   Eyes: Negative.   Respiratory: Negative.   Cardiovascular: Negative.   Gastrointestinal: Negative.   Genitourinary: Negative.   Musculoskeletal: Negative.   Skin: Negative.   Neurological: Negative.   Endo/Heme/Allergies: Negative.   Psychiatric/Behavioral: Negative.      Objective:   Vitals:   03/29/17 1130  BP: 130/76  Pulse: 66  Resp: 17  SpO2: 97%   Height:  (154.9 cm)  Weight: 177 lb 3.2 oz (80.4 kg)   Physical Exam  Constitutional: She is well-developed, well-nourished, and in no distress.  HENT:  Head: Normocephalic.  Right Ear: Tympanic membrane, external ear and ear canal normal.  Left Ear: Tympanic membrane, external ear and ear canal normal.  Nose: Nose normal. No mucosal edema or rhinorrhea.  Mouth/Throat: Uvula is midline, oropharynx is clear and moist and mucous membranes are normal. No oropharyngeal exudate.  Eyes: Conjunctivae are normal.  Neck: Trachea normal. No tracheal tenderness present. No tracheal deviation present. No thyromegaly present.  Cardiovascular: Normal rate, regular rhythm, S1 normal, S2 normal and normal heart sounds.   No murmur heard. Pulmonary/Chest: Breath sounds normal. No stridor. No respiratory distress. She has no wheezes. She has no rales.  Musculoskeletal: She exhibits no edema.  Lymphadenopathy:       Head (right side): No tonsillar adenopathy present.       Head (left side): No tonsillar adenopathy present.    She  has no cervical adenopathy.  Neurological: She is alert. Gait normal.  Skin: No rash noted. She is not diaphoretic. No erythema. Nails show no clubbing.  Psychiatric: Mood and affect normal.    Diagnostics:    Spirometry was performed and demonstrated an FEV1 of 1.84 at 99 % of predicted.  The patient had an Asthma Control Test with the following results: ACT Total Score: 25.    Assessment and Plan:   1. Asthma, well controlled, mild persistent   2. Other allergic rhinitis   3. Laryngopharyngeal reflux (LPR)     1. Continue Arnuity 200 one inhalation once daily   2. Continue Flonase one spray each nostril 1-2 times per day depending on disease activity  3. Continue ranitidine 150 mg twice a day  4. Continue ProAir HFA and albuterol nebulization and Claritin and nasal saline if needed  6. Return to clinic in 6 months or earlier if problem  7. Obtain fall flu vaccine  Shannon Martin appears to be doing very well on her current medical plan which includes anti-inflammatory agents for both her upper and lower airway and the use of a H2 receptor blocker to address her reflux. I will see her back in this clinic in 6 months or earlier if there is a problem. I also had a discussion with her today about her weight and suggested that she come up with a plan regarding this increased weight issue.  Laurette Schimke, MD Allergy / Immunology [facility]

## 2017-05-11 DIAGNOSIS — R69 Illness, unspecified: Secondary | ICD-10-CM | POA: Diagnosis not present

## 2017-05-31 ENCOUNTER — Telehealth: Payer: Self-pay | Admitting: Allergy and Immunology

## 2017-06-01 ENCOUNTER — Ambulatory Visit: Payer: Medicare HMO | Admitting: Allergy and Immunology

## 2017-06-06 DIAGNOSIS — J180 Bronchopneumonia, unspecified organism: Secondary | ICD-10-CM | POA: Diagnosis not present

## 2017-06-06 DIAGNOSIS — W000XXA Fall on same level due to ice and snow, initial encounter: Secondary | ICD-10-CM | POA: Diagnosis not present

## 2017-07-03 ENCOUNTER — Other Ambulatory Visit: Payer: Self-pay | Admitting: Allergy and Immunology

## 2017-09-14 ENCOUNTER — Other Ambulatory Visit: Payer: Self-pay | Admitting: Allergy and Immunology

## 2017-09-27 ENCOUNTER — Encounter: Payer: Self-pay | Admitting: Allergy and Immunology

## 2017-09-27 ENCOUNTER — Ambulatory Visit: Payer: Medicare HMO | Admitting: Allergy and Immunology

## 2017-09-27 VITALS — BP 130/70 | HR 56 | Resp 16

## 2017-09-27 DIAGNOSIS — M150 Primary generalized (osteo)arthritis: Secondary | ICD-10-CM | POA: Diagnosis not present

## 2017-09-27 DIAGNOSIS — K210 Gastro-esophageal reflux disease with esophagitis: Secondary | ICD-10-CM | POA: Diagnosis not present

## 2017-09-27 DIAGNOSIS — K219 Gastro-esophageal reflux disease without esophagitis: Secondary | ICD-10-CM

## 2017-09-27 DIAGNOSIS — J3089 Other allergic rhinitis: Secondary | ICD-10-CM

## 2017-09-27 DIAGNOSIS — N3941 Urge incontinence: Secondary | ICD-10-CM | POA: Diagnosis not present

## 2017-09-27 DIAGNOSIS — J4530 Mild persistent asthma, uncomplicated: Secondary | ICD-10-CM

## 2017-09-27 DIAGNOSIS — J4520 Mild intermittent asthma, uncomplicated: Secondary | ICD-10-CM | POA: Diagnosis not present

## 2017-09-27 DIAGNOSIS — E782 Mixed hyperlipidemia: Secondary | ICD-10-CM | POA: Diagnosis not present

## 2017-09-27 DIAGNOSIS — G43009 Migraine without aura, not intractable, without status migrainosus: Secondary | ICD-10-CM | POA: Diagnosis not present

## 2017-09-27 MED ORDER — FLUTICASONE PROPIONATE 50 MCG/ACT NA SUSP: 1.0000 | Freq: Every day | NASAL | 5 refills | Status: DC

## 2017-09-27 MED ORDER — FLUTICASONE FUROATE 100 MCG/ACT IN AEPB: 1.0000 | INHALATION_SPRAY | Freq: Every day | RESPIRATORY_TRACT | 3 refills | Status: DC

## 2017-09-27 NOTE — Progress Notes (Signed)
Follow-up Note  Referring Provider: Abigail Miyamoto,* Primary Provider: Abigail Miyamoto, MD Date of Office Visit: 09/27/2017  Subjective:   Shannon Martin (DOB: July 01, 1940) is a 77 y.o. female who [facility] in re-evaluation of the following:  HPI: Shannon Martin returns to this clinic in reevaluation of her asthma and allergic rhinitis and reflux and a history of empyema.  Her last visit to this clinic was 29 March 2017.  Overall she has had a very good 70-month interval and has not required a systemic steroid or an antibiotic to treat any type of respiratory tract issue and rarely uses a short acting bronchodilator and can exert herself without any problem to the extent that her musculoskeletal issues allow her to exercise.  She has had very little issues with her nose.  As long as she is very careful about what she eats she has no problems with her reflux.  However, she does eat chocolate 1 time per week and definitely develops issues with classic reflux symptoms when doing so.  She did receive the flu vaccine this year.  Allergies as of 09/27/2017      Reactions   Tetanus Toxoids Swelling, Other (See Comments)   Swelling around throat and jaws   Bee Venom Swelling, Other (See Comments)   Crusty area on skin   Hornet Venom Swelling   Other reaction(s): Other (See Comments) Crusty area on skin   Dilaudid [hydromorphone] Nausea And Vomiting   Causes nausea and vomiting   Erythromycin Nausea And Vomiting   Fiorinal [butalbital-aspirin-caffeine]    Altered mental status   Ibuprofen Other (See Comments)   Blotches on skin, then skin peeling.   Amoxicillin Other (See Comments)   Upset stomach   Doxycycline Other (See Comments)   Upset stomach   Latex Other (See Comments)   Skin will crack open and bleed   Neomycin Other (See Comments)   Irritates skin   Penicillins Itching, Swelling, Rash      Medication List        albuterol 108 (90 Base) MCG/ACT inhaler Commonly known as:  PROVENTIL HFA;VENTOLIN HFA Inhale two puffs every four to six hours as needed for cough or wheeze.   ARTIFICIAL TEARS OP Apply 1 drop to eye as needed.   Calcium + D3 600-200 MG-UNIT Tabs Take 1 tablet by mouth 2 (two) times daily.   fluticasone 50 MCG/ACT nasal spray Commonly known as:  FLONASE Place 1 spray into both nostrils daily.   Fluticasone Furoate 200 MCG/ACT Aepb Commonly known as:  ARNUITY ELLIPTA Inhale 1 Dose into the lungs daily. Rinse, gargle, and spit after use.   HYDROcodone-acetaminophen 5-325 MG tablet Commonly known as:  NORCO/VICODIN TK 1 T PO Q 4 TO 6 H PRN P   ibuprofen 600 MG tablet Commonly known as:  ADVIL,MOTRIN TK 1 T PO  Q 6 H PRN P   loratadine 10 MG tablet Commonly known as:  CLARITIN Take 10 mg by mouth daily.   MULTIVITAMIN PO Take 1 tablet by mouth daily.   polyethylene glycol powder powder Commonly known as:  GLYCOLAX/MIRALAX   propranolol 40 MG tablet Commonly known as:  INDERAL Take 40 mg by mouth daily. Reported on 01/13/2016   ranitidine 150 MG tablet Commonly known as:  ZANTAC TAKE 1 TABLET BY MOUTH TWICE DAILY   simvastatin 40 MG tablet Commonly known as:  ZOCOR Take 40 mg by mouth daily.   VESICARE 5  MG tablet Generic drug:  solifenacin Take 1 tablet by mouth daily.   VITAMIN C PO Take 1 tablet by mouth 2 (two) times daily.   VITAMIN E PO Take 1 capsule by mouth as needed.       Past Medical History:  Diagnosis Date  . Arthritis    "knees, hands" (01/12/2015)  . Asthma    "mild"  . Environmental and seasonal allergies   . GERD (gastroesophageal reflux disease)   . History of blood transfusion 06/2013   "during hospitalization"  . Migraines 1943 to present   "fairly frequently"; Takes Propranolol (01/12/2015)  . Overactive bladder    Takes Vesicare    Past Surgical History:  Procedure Laterality Date  . BREAST BIOPSY Left ~ 1973  . CATARACT  EXTRACTION W/ INTRAOCULAR LENS  IMPLANT, BILATERAL Bilateral 2003  . CHEST TUBE INSERTION  January 2015   X 2 at [facility]  . DILATION AND CURETTAGE OF UTERUS  4-5  . JOINT REPLACEMENT    . REPLACEMENT TOTAL KNEE Left   . TONSILLECTOMY AND ADENOIDECTOMY  1946; 1947  . TOTAL KNEE ARTHROPLASTY Left 01/11/2015  . TOTAL KNEE ARTHROPLASTY Left 01/11/2015   Procedure: Left TOTAL KNEE ARTHROPLASTY;  Surgeon: Dannielle Huh, MD;  Location: [facility] OR;  Service: Orthopedics;  Laterality: Left;  Marland Kitchen VIDEO ASSISTED THORACOSCOPY (VATS)/THOROCOTOMY Left 07/13/2013   VATS with insertion of chest tubes    Review of systems negative except as noted in HPI / PMHx or noted below:  Review of Systems  Constitutional: Negative.   HENT: Negative.   Eyes: Negative.   Respiratory: Negative.   Cardiovascular: Negative.   Gastrointestinal: Negative.   Genitourinary: Negative.   Musculoskeletal: Negative.   Skin: Negative.   Neurological: Negative.   Endo/Heme/Allergies: Negative.   Psychiatric/Behavioral: Negative.      Objective:   Vitals:   09/27/17 1055  BP: 130/70  Pulse: (!) 56  Resp: 16          Physical Exam  Constitutional: She is well-developed, well-nourished, and in no distress.  HENT:  Head: Normocephalic.  Right Ear: Tympanic membrane, external ear and ear canal normal.  Left Ear: Tympanic membrane, external ear and ear canal normal.  Nose: Nose normal. No mucosal edema or rhinorrhea.  Mouth/Throat: Uvula is midline, oropharynx is clear and moist and mucous membranes are normal. No oropharyngeal exudate.  Eyes: Conjunctivae are normal.  Neck: Trachea normal. No tracheal tenderness present. No tracheal deviation present. No thyromegaly present.  Cardiovascular: Normal rate, regular rhythm, S1 normal, S2 normal and normal heart sounds.  No murmur heard. Pulmonary/Chest: Breath sounds normal. No stridor. No respiratory distress. She has no wheezes. She has no rales.    Musculoskeletal: She exhibits no edema.  Lymphadenopathy:       Head (right side): No tonsillar adenopathy present.       Head (left side): No tonsillar adenopathy present.    She has no cervical adenopathy.  Neurological: She is alert. Gait normal.  Skin: No rash noted. She is not diaphoretic. No erythema. Nails show no clubbing.  Psychiatric: Mood and affect normal.    Diagnostics:    Spirometry was performed and demonstrated an FEV1 of 1.90 at 104 % of predicted.  The patient had an Asthma Control Test with the following results: ACT Total Score: 22.    Assessment and Plan:   1. Asthma, well controlled, mild persistent   2. Other allergic rhinitis   3. Laryngopharyngeal reflux (LPR)  1. Decrease Arnuity 100 one inhalation once daily   2. Continue Flonase one spray each nostril 1-2 times per day depending on disease activity  3. Continue ranitidine 150 mg 1-2 times a day  4. Continue ProAir HFA and albuterol nebulization and Claritin and nasal saline if needed  5. Return to clinic in 6 months or earlier if problem   Overall Shannon Martin has really done relatively well on her current plan and will see if there is an opportunity to decrease the amount of inhaled steroid that she is utilizing at this point by decreasing her Arnuity 50%.  She understands about the use of Flonase and ranitidine and has a very good appreciation for her atopic respiratory disease and reflux in general.  If she continues to do well I will see her back in this clinic in 6 months or earlier if there is a problem.  Laurette SchimkeEric Kozlow, MD Allergy / Immunology [facility]

## 2017-09-27 NOTE — Patient Instructions (Addendum)
  1. Decrease Arnuity 100 one inhalation once daily   2. Continue Flonase one spray each nostril 1-2 times per day depending on disease activity  3. Continue ranitidine 150 mg 1-2 times a day  4. Continue ProAir HFA and albuterol nebulization and Claritin and nasal saline if needed  5. Return to clinic in 6 months or earlier if problem

## 2017-10-01 ENCOUNTER — Encounter: Payer: Self-pay | Admitting: Allergy and Immunology

## 2017-10-17 DIAGNOSIS — R748 Abnormal levels of other serum enzymes: Secondary | ICD-10-CM | POA: Diagnosis not present

## 2018-03-21 ENCOUNTER — Telehealth: Payer: Self-pay | Admitting: Allergy and Immunology

## 2018-03-21 NOTE — Telephone Encounter (Signed)
Alcie came in the office and states she has been trying to call "billing" and can never get an answer.  Iasia states she has an issue with how her spirometry's are being billed.  Lilliauna states they are being separately and it is causing her to get charged a copay separate from the copay she pays at the office.  Leisl would like someone to look into it and fix it.  Eshaal states she is over paying.  Please advise.

## 2018-03-21 NOTE — Telephone Encounter (Signed)
Called Pt back and no answer.  Left detailed message.  That Olegario Messier has spoken with her before about this situation as well has left her a msg.  I will be mailing out an EOB for this visit in the morning mail so she can see that this spirometry was billed on the same claim and date of service as the office visit.  Once she gets the EOB if she has any questions to please give me a call.  The insurance is just charging her an extra $20 for spirometry tests.  ** As an added note... Olegario Messier has even called the ins company about this extra $20 charge before and talk with pt about it before that has been noted in the Guarantor account notes on her account.

## 2018-03-22 NOTE — Telephone Encounter (Signed)
Mailed out EOB for DOS

## 2018-03-29 DIAGNOSIS — G43009 Migraine without aura, not intractable, without status migrainosus: Secondary | ICD-10-CM | POA: Diagnosis not present

## 2018-03-29 DIAGNOSIS — Z23 Encounter for immunization: Secondary | ICD-10-CM | POA: Diagnosis not present

## 2018-03-29 DIAGNOSIS — J4520 Mild intermittent asthma, uncomplicated: Secondary | ICD-10-CM | POA: Diagnosis not present

## 2018-03-29 DIAGNOSIS — K210 Gastro-esophageal reflux disease with esophagitis: Secondary | ICD-10-CM | POA: Diagnosis not present

## 2018-03-29 DIAGNOSIS — Z6835 Body mass index (BMI) 35.0-35.9, adult: Secondary | ICD-10-CM | POA: Diagnosis not present

## 2018-03-29 DIAGNOSIS — E782 Mixed hyperlipidemia: Secondary | ICD-10-CM | POA: Diagnosis not present

## 2018-03-29 DIAGNOSIS — M150 Primary generalized (osteo)arthritis: Secondary | ICD-10-CM | POA: Diagnosis not present

## 2018-04-01 ENCOUNTER — Ambulatory Visit: Payer: Medicare HMO | Admitting: Allergy and Immunology

## 2018-04-08 DIAGNOSIS — N189 Chronic kidney disease, unspecified: Secondary | ICD-10-CM | POA: Diagnosis not present

## 2018-04-08 DIAGNOSIS — R339 Retention of urine, unspecified: Secondary | ICD-10-CM | POA: Diagnosis not present

## 2018-04-08 DIAGNOSIS — N3281 Overactive bladder: Secondary | ICD-10-CM | POA: Diagnosis not present

## 2018-04-08 DIAGNOSIS — Z79899 Other long term (current) drug therapy: Secondary | ICD-10-CM | POA: Diagnosis not present

## 2018-04-08 DIAGNOSIS — N9089 Other specified noninflammatory disorders of vulva and perineum: Secondary | ICD-10-CM | POA: Diagnosis not present

## 2018-04-08 DIAGNOSIS — N952 Postmenopausal atrophic vaginitis: Secondary | ICD-10-CM | POA: Diagnosis not present

## 2018-04-23 DIAGNOSIS — N3281 Overactive bladder: Secondary | ICD-10-CM | POA: Diagnosis not present

## 2018-04-23 DIAGNOSIS — R339 Retention of urine, unspecified: Secondary | ICD-10-CM | POA: Diagnosis not present

## 2018-04-23 DIAGNOSIS — N189 Chronic kidney disease, unspecified: Secondary | ICD-10-CM | POA: Diagnosis not present

## 2018-04-25 DIAGNOSIS — N952 Postmenopausal atrophic vaginitis: Secondary | ICD-10-CM | POA: Diagnosis not present

## 2018-04-25 DIAGNOSIS — R339 Retention of urine, unspecified: Secondary | ICD-10-CM | POA: Diagnosis not present

## 2018-04-25 DIAGNOSIS — N3281 Overactive bladder: Secondary | ICD-10-CM | POA: Diagnosis not present

## 2018-04-29 ENCOUNTER — Telehealth: Payer: Self-pay | Admitting: Allergy and Immunology

## 2018-04-29 NOTE — Telephone Encounter (Signed)
Shannon Martin came into the office and was very upset.  She states no one has contacted her about the copay issue.  I informed her of Shannon Martin's message in her chart stating Shannon Martin left her a message and mailed out an EOB.  Shannon Martin states none of that happened and states she has to stop coming here and let her PCP take over her treatment.  In an effort to keep her, I offered to message Shannon Martin directly.  Shannon Martin is upset because she is still being charged for the Shannon Martin and I explained per Shannon Martin's notes in her guarantor account that $25 of her $45 copay goes towards the doctor visit and $20 of her $45 copay goes towards the Shannon Martin.  She was upset and states she pays the $45 in office so then if that were true she shouldn't be charged for the Shannon Martin.  I apologized several times and told her we would do whatever we could to see how we could fix this if this was an issue on our end but I told her this is Community education officer.  Please advise.  I did confirm her phone number and all is correct.  Shannon Martin also stated her daughter tried to call and get this corrected as well.

## 2018-04-29 NOTE — Telephone Encounter (Signed)
Left message - she has written on her [employer] card that her specialist copay is $40 so I asked her to send $15 & I'll adjust the $5 - told her to call tomorrow with any questions - kt

## 2018-05-07 NOTE — Telephone Encounter (Signed)
Joni Reiningicole, Just wanted you to know that I called [name] & left a message.  The $20 has been turned over to collections, but I will pull that from them if she will pay $15.  She has written on her card that she has a $40 copay for specialists and she only paid $25 that day.

## 2018-05-21 NOTE — Telephone Encounter (Signed)
Shannon Martin Since Shannon Martin had to take her leave sooner than expected, maybe someone from billing should follow up with this patient.

## 2018-05-28 NOTE — Telephone Encounter (Signed)
Hey Johnette, As you can see from my note 3 weeks ago, I called [name] then, but I have spoken to her about this balance several times.  She continues to say that no one returns her calls, but we do.  Duwayne HeckDanielle has also talked to her. I have called her insurance company to make sure they processed her claim correctly and they say this is her balance. Thanks, Olegario MessierKathy

## 2018-06-06 DIAGNOSIS — N952 Postmenopausal atrophic vaginitis: Secondary | ICD-10-CM | POA: Diagnosis not present

## 2018-06-06 DIAGNOSIS — N3281 Overactive bladder: Secondary | ICD-10-CM | POA: Diagnosis not present

## 2018-06-24 DIAGNOSIS — H5213 Myopia, bilateral: Secondary | ICD-10-CM | POA: Diagnosis not present

## 2018-06-24 DIAGNOSIS — I10 Essential (primary) hypertension: Secondary | ICD-10-CM | POA: Diagnosis not present

## 2018-06-24 DIAGNOSIS — Z961 Presence of intraocular lens: Secondary | ICD-10-CM | POA: Diagnosis not present

## 2018-06-24 DIAGNOSIS — H353131 Nonexudative age-related macular degeneration, bilateral, early dry stage: Secondary | ICD-10-CM | POA: Diagnosis not present

## 2018-06-24 DIAGNOSIS — Z9849 Cataract extraction status, unspecified eye: Secondary | ICD-10-CM | POA: Diagnosis not present

## 2018-06-24 DIAGNOSIS — H52223 Regular astigmatism, bilateral: Secondary | ICD-10-CM | POA: Diagnosis not present

## 2018-06-24 DIAGNOSIS — H524 Presbyopia: Secondary | ICD-10-CM | POA: Diagnosis not present

## 2018-06-25 DIAGNOSIS — Z1231 Encounter for screening mammogram for malignant neoplasm of breast: Secondary | ICD-10-CM | POA: Diagnosis not present

## 2018-07-02 DIAGNOSIS — J4521 Mild intermittent asthma with (acute) exacerbation: Secondary | ICD-10-CM | POA: Diagnosis not present

## 2018-09-19 DIAGNOSIS — Z0000 Encounter for general adult medical examination without abnormal findings: Secondary | ICD-10-CM | POA: Diagnosis not present

## 2018-11-05 DIAGNOSIS — R339 Retention of urine, unspecified: Secondary | ICD-10-CM | POA: Diagnosis not present

## 2018-11-05 DIAGNOSIS — N952 Postmenopausal atrophic vaginitis: Secondary | ICD-10-CM | POA: Diagnosis not present

## 2018-11-26 DIAGNOSIS — K210 Gastro-esophageal reflux disease with esophagitis: Secondary | ICD-10-CM | POA: Diagnosis not present

## 2018-11-26 DIAGNOSIS — J4520 Mild intermittent asthma, uncomplicated: Secondary | ICD-10-CM | POA: Diagnosis not present

## 2018-11-26 DIAGNOSIS — M150 Primary generalized (osteo)arthritis: Secondary | ICD-10-CM | POA: Diagnosis not present

## 2018-11-26 DIAGNOSIS — E782 Mixed hyperlipidemia: Secondary | ICD-10-CM | POA: Diagnosis not present

## 2018-11-26 DIAGNOSIS — R55 Syncope and collapse: Secondary | ICD-10-CM | POA: Diagnosis not present

## 2018-11-29 ENCOUNTER — Telehealth: Payer: Self-pay | Admitting: Cardiology

## 2018-11-29 NOTE — Telephone Encounter (Signed)
Virtual Visit Pre-Appointment Phone Call  "(Name), I am calling you today to discuss your upcoming appointment. We are currently trying to limit exposure to the virus that causes COVID-19 by seeing patients at home rather than in the office."  1. "What is the BEST phone number to call the day of the visit?" - include this in appointment notes  2. Do you have or have access to (through a family member/friend) a smartphone with video capability that we can use for your visit?" a. If yes - list this number in appt notes as cell (if different from BEST phone #) and list the appointment type as a VIDEO visit in appointment notes b. If no - list the appointment type as a PHONE visit in appointment notes  3. Confirm consent - "In the setting of the current Covid19 crisis, you are scheduled for a (phone or video) visit with your provider on (date) at (time).  Just as we do with many in-office visits, in order for you to participate in this visit, we must obtain consent.  If you'd like, I can send this to your mychart (if signed up) or email for you to review.  Otherwise, I can obtain your verbal consent now.  All virtual visits are billed to your insurance company just like a normal visit would be.  By agreeing to a virtual visit, we'd like you to understand that the technology does not allow for your provider to perform an examination, and thus may limit your provider's ability to fully assess your condition. If your provider identifies any concerns that need to be evaluated in person, we will make arrangements to do so.  Finally, though the technology is pretty good, we cannot assure that it will always work on either your or our end, and in the setting of a video visit, we may have to convert it to a phone-only visit.  In either situation, we cannot ensure that we have a secure connection.  Are you willing to proceed?" STAFF: Did the patient verbally acknowledge consent to telehealth visit? Document  YES/NO here: YES  4. Advise patient to be prepared - "Two hours prior to your appointment, go ahead and check your blood pressure, pulse, oxygen saturation, and your weight (if you have the equipment to check those) and write them all down. When your visit starts, your provider will ask you for this information. If you have an Apple Watch or Kardia device, please plan to have heart rate information ready on the day of your appointment. Please have a pen and paper handy nearby the day of the visit as well."  5. Give patient instructions for MyChart download to smartphone OR Doximity/Doxy.me as below if video visit (depending on what platform provider is using)  6. Inform patient they will receive a phone call 15 minutes prior to their appointment time (may be from unknown caller ID) so they should be prepared to answer    TELEPHONE CALL NOTE  Shannon Martin has been deemed a candidate for a follow-up tele-health visit to limit community exposure during the Covid-19 pandemic. I spoke with the patient via phone to ensure availability of phone/video source, confirm preferred email & phone number, and discuss instructions and expectations.  I reminded Shannon Martin to be prepared with any vital sign and/or heart rhythm information that could potentially be obtained via home monitoring, at the time of her visit. I reminded Shannon Martin to expect a phone call prior to  her visit.  Shannon Martin 11/29/2018 11:46 AM   INSTRUCTIONS FOR DOWNLOADING THE MYCHART APP TO SMARTPHONE  - The patient must first make sure to have activated MyChart and know their login information - If Apple, go to [employer] and type in MyChart in the search bar and download the app. If Android, ask patient to go to [employer] and type in [place] in the search bar and download the app. The app is free but as with any other app downloads, their phone may require them to verify saved payment information or Apple/Android  password.  - The patient will need to then log into the app with their MyChart username and password, and select [facility] as their healthcare provider to link the account. When it is time for your visit, go to the MyChart app, find appointments, and click Begin Video Visit. Be sure to Select Allow for your device to access the Microphone and Camera for your visit. You will then be connected, and your provider will be with you shortly.  **If they have any issues connecting, or need assistance please contact MyChart service desk [phone])**  **If using a computer, in order to ensure the best quality for their visit they will need to use either of the following Internet Browsers: [employer] or Google Chrome**  IF USING DOXIMITY or DOXY.ME - The patient will receive a link just prior to their visit by text.     FULL LENGTH CONSENT FOR TELE-HEALTH VISIT   I hereby voluntarily request, consent and authorize [facility] and its employed or contracted physicians, physician assistants, nurse practitioners or other licensed health care professionals (the Practitioner), to provide me with telemedicine health care services (the Services") as deemed necessary by the treating Practitioner. I acknowledge and consent to receive the Services by the Practitioner via telemedicine. I understand that the telemedicine visit will involve communicating with the Practitioner through live audiovisual communication technology and the disclosure of certain medical information by electronic transmission. I acknowledge that I have been given the opportunity to request an in-person assessment or other available alternative prior to the telemedicine visit and am voluntarily participating in the telemedicine visit.  I understand that I have the right to withhold or withdraw my consent to the use of telemedicine in the course of my care at any time, without affecting my right to future care or treatment,  and that the Practitioner or I may terminate the telemedicine visit at any time. I understand that I have the right to inspect all information obtained and/or recorded in the course of the telemedicine visit and may receive copies of available information for a reasonable fee.  I understand that some of the potential risks of receiving the Services via telemedicine include:   Delay or interruption in medical evaluation due to technological equipment failure or disruption;  Information transmitted may not be sufficient (e.g. poor resolution of images) to allow for appropriate medical decision making by the Practitioner; and/or   In rare instances, security protocols could fail, causing a breach of personal health information.  Furthermore, I acknowledge that it is my responsibility to provide information about my medical history, conditions and care that is complete and accurate to the best of my ability. I acknowledge that Practitioner's advice, recommendations, and/or decision may be based on factors not within their control, such as incomplete or inaccurate data provided by me or distortions of diagnostic images or specimens that may result from electronic transmissions. I  understand that the practice of medicine is not an exact science and that Practitioner makes no warranties or guarantees regarding treatment outcomes. I acknowledge that I will receive a copy of this consent concurrently upon execution via email to the email address I last provided but may also request a printed copy by calling the office of [place].    I understand that my insurance will be billed for this visit.   I have read or had this consent read to me.  I understand the contents of this consent, which adequately explains the benefits and risks of the Services being provided via telemedicine.   I have been provided ample opportunity to ask questions regarding this consent and the Services and have had my questions  answered to my satisfaction.  I give my informed consent for the services to be provided through the use of telemedicine in my medical care  By participating in this telemedicine visit I agree to the above.

## 2018-12-02 ENCOUNTER — Encounter: Payer: Self-pay | Admitting: *Deleted

## 2018-12-05 ENCOUNTER — Telehealth: Payer: Medicare Other | Admitting: Cardiology

## 2018-12-05 ENCOUNTER — Other Ambulatory Visit: Payer: Self-pay

## 2018-12-11 ENCOUNTER — Other Ambulatory Visit: Payer: Self-pay

## 2018-12-11 ENCOUNTER — Encounter: Payer: Self-pay | Admitting: Cardiology

## 2018-12-11 ENCOUNTER — Telehealth (INDEPENDENT_AMBULATORY_CARE_PROVIDER_SITE_OTHER): Payer: Medicare Other | Admitting: Cardiology

## 2018-12-11 VITALS — Ht 61.0 in | Wt 160.0 lb

## 2018-12-11 DIAGNOSIS — I10 Essential (primary) hypertension: Secondary | ICD-10-CM | POA: Diagnosis not present

## 2018-12-11 DIAGNOSIS — R55 Syncope and collapse: Secondary | ICD-10-CM | POA: Insufficient documentation

## 2018-12-11 DIAGNOSIS — E782 Mixed hyperlipidemia: Secondary | ICD-10-CM

## 2018-12-11 HISTORY — DX: Essential (primary) hypertension: I10

## 2018-12-11 HISTORY — DX: Mixed hyperlipidemia: E78.2

## 2018-12-11 HISTORY — DX: Syncope and collapse: R55

## 2018-12-11 NOTE — Progress Notes (Signed)
Virtual Visit via Video Note   This visit type was conducted due to national recommendations for restrictions regarding the COVID-19 Pandemic (e.g. social distancing) in an effort to limit this patient's exposure and mitigate transmission in our community.  Due to her co-morbid illnesses, this patient is at least at moderate risk for complications without adequate follow up.  This format is felt to be most appropriate for this patient at this time.  All issues noted in this document were discussed and addressed.  A limited physical exam was performed with this format.  Please refer to the patient's chart for her consent to telehealth for [facility].   Date:  12/11/2018   ID:  Shannon Martin, DOB 1940/10/10, MRN [id number]  Patient Location: Home Provider Location: Home  PCP:  Lillard Anes, MD  Cardiologist:  No primary care provider on file.  Electrophysiologist:  None   Evaluation Performed:  Consultation - Shannon Martin was referred by Dr Henrene Pastor for the evaluation of syncope.  Chief Complaint: Syncope and collapse  History of Present Illness:    Shannon Martin is a 78 y.o. female with past medical history of mixed dyslipidemia, mild essential hypertension and history of exposure to secondhand smoke.  Patient mentions to me that about 2 weeks ago she had an episode of syncope.  She was trying to go to the bathroom and felt lightheaded and fell down.  Subsequently as soon as she fell she regained consciousness.  This happened again after 2 more days.  Subsequently she has not had this problem.  Her primary care physician has stopped her beta-blocker.  She denies any chest pain orthopnea or PND.  She mentions to me that she is very active lady and takes care of activities of daily living.  She lives by herself.  At the time of my evaluation, the patient is alert awake oriented and in no distress.  The patient does not have symptoms concerning for COVID-19 infection (fever,  chills, cough, or new shortness of breath).    Past Medical History:  Diagnosis Date  . Allergic rhinitis 02/16/2015  . Arthritis    "knees, hands" (01/12/2015)  . Asthma    "mild"  . Duodenal ulcer    Admitted to hospital twice with this.  . Environmental and seasonal allergies   . GERD (gastroesophageal reflux disease)   . History of blood transfusion 06/2013   "during hospitalization"  . Hyperlipidemia   . Iliotibial band syndrome of left side 06/17/2015  . 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 Left knee pain 10/01/2014  . Migraines 1943 to present   "fairly frequently"; Takes Propranolol (01/12/2015)  . 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  . Overactive bladder    Takes Vesicare  . Primary osteoarthritis of left knee 12/03/2014  . 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 BREAST BIOPSY Left ~ 1973  . CATARACT EXTRACTION W/ INTRAOCULAR LENS  IMPLANT, BILATERAL Bilateral 2003  . CHEST TUBE INSERTION  January 2015   X 2 at [facility]  4-5  . JOINT REPLACEMENT    . REPLACEMENT TOTAL KNEE Left   . TONSILLECTOMY AND ADENOIDECTOMY  1946; 1947  . TOTAL KNEE ARTHROPLASTY Left 01/11/2015  . TOTAL KNEE ARTHROPLASTY Left 01/11/2015   Procedure: Left TOTAL KNEE ARTHROPLASTY;  Surgeon: Vickey Huger, MD;  Location: [facility];  Service: Orthopedics;  Laterality: Left;  .  VIDEO ASSISTED THORACOSCOPY (VATS)/THOROCOTOMY Left 07/13/2013   VATS with insertion of chest tubes     Current Meds  Medication Sig  . albuterol (PROVENTIL HFA;VENTOLIN HFA) 108 (90 Base) MCG/ACT inhaler Inhale two puffs every four to six hours as needed for cough or wheeze.  . Ascorbic Acid (VITAMIN C PO) Take 1 tablet by mouth daily.   . Calcium Carb-Cholecalciferol (CALCIUM + D3) 600-200 MG-UNIT TABS Take 1 tablet by mouth 2 (two) times daily.  Marland Kitchen. estradiol (ESTRACE) 0.1 MG/GM vaginal cream Place  1 Applicatorful vaginally at bedtime.  . famotidine (PEPCID) 40 MG tablet Take 40 mg by mouth 2 (two) times daily.  . fluticasone (FLONASE) 50 MCG/ACT nasal spray Place 1 spray into both nostrils daily.  . Fluticasone Furoate (ARNUITY ELLIPTA) 100 MCG/ACT AEPB Inhale 1 Dose into the lungs daily.  Marland Kitchen. HYDROcodone-acetaminophen (NORCO/VICODIN) 5-325 MG tablet every 8 (eight) hours as needed.   . Hypromellose (ARTIFICIAL TEARS OP) Apply 1 drop to eye as needed.  . loratadine (CLARITIN) 10 MG tablet Take 10 mg by mouth daily.  . Multiple Vitamins-Minerals (MULTIVITAMIN PO) Take 1 tablet by mouth daily.  . simvastatin (ZOCOR) 40 MG tablet Take 40 mg by mouth daily.  . tamsulosin (FLOMAX) 0.4 MG CAPS capsule Take 0.4 mg by mouth daily.  Marland Kitchen. VITAMIN E PO Take 1 capsule by mouth as needed.     Allergies:   Tetanus toxoids, Bee venom, Hornet venom, Dilaudid [hydromorphone], Erythromycin, Fiorinal [butalbital-aspirin-caffeine], Ibuprofen, Amoxicillin, Doxycycline, Latex, Neomycin, and Penicillins   Social History   Tobacco Use  . Smoking status: Never Smoker  . Smokeless tobacco: Never Used  Substance Use Topics  . Alcohol use: Yes    Alcohol/week: 1.0 standard drinks    Types: 1 Glasses of wine per week  . Drug use: No     Family Hx: The patient's family history is not on file.  ROS:   Please see the history of present illness.    As mentioned above All other systems reviewed and are negative.   Prior CV studies:   The following studies were reviewed today:  I reviewed records from primary care physician's office  Labs/Other Tests and Data Reviewed:    EKG:  EKG is said to have been in sinus rhythm and left anterior fascicular block.  I will try to review the actual copy of it when I receive it  Recent Labs: No results found for requested labs within last 8760 hours.   Recent Lipid Panel No results found for: CHOL, TRIG, HDL, CHOLHDL, LDLCALC, LDLDIRECT  Wt Readings from Last 3  Encounters:  12/11/18 160 lb (72.6 kg)  03/29/17 177 lb 3.2 oz (80.4 kg)  01/11/15 183 lb (83 kg)     Objective:    Vital Signs:  Ht 5\' 1"  (1.549 m)   Wt 160 lb (72.6 kg)   BMI 30.23 kg/m    VITAL SIGNS:  reviewed  ASSESSMENT & PLAN:    1. Syncope: I discussed my findings with the patient at extensive length.  I will try to obtain a copy of recent blood work and the EKG per se from primary care physician's office.  I would like to see if there is any issues with her TSH.  Fall precautions were advised to the patient at length. 2. She was advised to keep a track of her pulse and blood pressure on a regular basis.  She was advised to bring these readings when she comes for an echocardiogram to her  office.  And bring her machine to. 3. Echocardiogram will be done to assess her cardiac anatomy and endorgan damage in view of essential hypertension 4. Essential hypertension: She will keep a track of blood pressures.  Overall she tells me that her blood pressures are not fine. 5. In view of syncope and the suspicion of bradycardia arrhythmias which the patient states that she checks her pulse and her heart rate is in the low 30s or 40s at times we will do a 2-week ZIO monitor. 6. Patient will be seen in follow-up appointment in 2 months or earlier if the patient has any concerns 7. In view of the syncopal spell she was advised not to drive until we get the above evaluation and then her primary care doctor will evaluate for other causes of possible syncope.  COVID-19 Education: The signs and symptoms of COVID-19 were discussed with the patient and how to seek care for testing (follow up with PCP or arrange E-visit).  The importance of social distancing was discussed today.  Time:   Today, I have spent 32 minutes with the patient with telehealth technology discussing the above problems.  Total time for evaluation was 45 minutes and this included chart review.   Medication Adjustments/Labs and  Tests Ordered: Current medicines are reviewed at length with the patient today.  Concerns regarding medicines are outlined above.   Tests Ordered: No orders of the defined types were placed in this encounter.   Medication Changes: No orders of the defined types were placed in this encounter.   Follow Up:  Virtual Visit or In Person in 2 month(s)  Signed, Garwin Brothersajan R Sharolyn Weber, MD  12/11/2018 9:38 AM    [facility]

## 2018-12-11 NOTE — Addendum Note (Signed)
Addended by: Beckey Rutter on: 12/11/2018 10:12 AM   Modules accepted: Orders

## 2018-12-11 NOTE — Addendum Note (Signed)
Addended by: Beckey Rutter on: 12/11/2018 10:13 AM   Modules accepted: Orders

## 2018-12-11 NOTE — Patient Instructions (Signed)
Medication Instructions:  Your physician recommends that you continue on your current medications as directed. Please refer to the Current Medication list given to you today.  If you need a refill on your cardiac medications before your next appointment, please call your pharmacy.   Lab work: NONE If you have labs (blood work) drawn today and your tests are completely normal, you will receive your results only by: Marland Kitchen MyChart Message (if you have MyChart) OR . A paper copy in the mail If you have any lab test that is abnormal or we need to change your treatment, we will call you to review the results.  Testing/Procedures: Your physician has recommended that you wear a ZIO monitor. ZIO monitors are medical devices that record the heart's electrical activity. Doctors most often use these monitors to diagnose arrhythmias. Arrhythmias are problems with the speed or rhythm of the heartbeat. The monitor is a small, portable device. You can wear one while you do your normal daily activities. This is usually used to diagnose what is causing palpitations/syncope (passing out).You will wear this device for 14 days.  Your physician has requested that you have an echocardiogram. YOU HAVE BEEN SCHEDULED TO HAVE THIS PROCEDURE PERFORMED ON January 20, 2019 at 09:15 AM.Echocardiography is a painless test that uses sound waves to create images of your heart. It provides your doctor with information about the size and shape of your heart and how well your heart's chambers and valves are working. This procedure takes approximately one hour. There are no restrictions for this procedure.    Follow-Up: At [facility], you and your health needs are our priority.  As part of our continuing mission to provide you with exceptional heart care, we have created designated Provider Care Teams.  These Care Teams include your primary Cardiologist (physician) and Advanced Practice Providers (APPs -  Physician Assistants and Nurse  Practitioners) who all work together to provide you with the care you need, when you need it. You will need a follow up appointment in 2 months.   Any Other Special Instructions Will Be Listed Below   Echocardiogram An echocardiogram is a procedure that uses painless sound waves (ultrasound) to produce an image of the heart. Images from an echocardiogram can provide important information about:  Signs of coronary artery disease (CAD).  Aneurysm detection. An aneurysm is a weak or damaged part of an artery wall that bulges out from the normal force of blood pumping through the body.  Heart size and shape. Changes in the size or shape of the heart can be associated with certain conditions, including heart failure, aneurysm, and CAD.  Heart muscle function.  Heart valve function.  Signs of a past heart attack.  Fluid buildup around the heart.  Thickening of the heart muscle.  A tumor or infectious growth around the heart valves. Tell a health care provider about:  Any allergies you have.  All medicines you are taking, including vitamins, herbs, eye drops, creams, and over-the-counter medicines.  Any blood disorders you have.  Any surgeries you have had.  Any medical conditions you have.  Whether you are pregnant or may be pregnant. What are the risks? Generally, this is a safe procedure. However, problems may occur, including:  Allergic reaction to dye (contrast) that may be used during the procedure. What happens before the procedure? No specific preparation is needed. You may eat and drink normally. What happens during the procedure?   An IV tube may be inserted into one  of your veins.  You may receive contrast through this tube. A contrast is an injection that improves the quality of the pictures from your heart.  A gel will be applied to your chest.  A wand-like tool (transducer) will be moved over your chest. The gel will help to transmit the sound waves from  the transducer.  The sound waves will harmlessly bounce off of your heart to allow the heart images to be captured in real-time motion. The images will be recorded on a computer. The procedure may vary among health care providers and hospitals. What happens after the procedure?  You may return to your normal, everyday life, including diet, activities, and medicines, unless your health care provider tells you not to do that. Summary  An echocardiogram is a procedure that uses painless sound waves (ultrasound) to produce an image of the heart.  Images from an echocardiogram can provide important information about the size and shape of your heart, heart muscle function, heart valve function, and fluid buildup around your heart.  You do not need to do anything to prepare before this procedure. You may eat and drink normally.  After the echocardiogram is completed, you may return to your normal, everyday life, unless your health care provider tells you not to do that. This information is not intended to replace advice given to you by your health care provider. Make sure you discuss any questions you have with your health care provider. Document Released: 06/09/2000 Document Revised: 07/15/2016 Document Reviewed: 07/15/2016 Elsevier Interactive Patient Education  2019 [employer]

## 2018-12-17 ENCOUNTER — Other Ambulatory Visit (INDEPENDENT_AMBULATORY_CARE_PROVIDER_SITE_OTHER): Payer: Medicare Other

## 2018-12-17 DIAGNOSIS — R55 Syncope and collapse: Secondary | ICD-10-CM

## 2019-01-09 DIAGNOSIS — R55 Syncope and collapse: Secondary | ICD-10-CM | POA: Diagnosis not present

## 2019-01-16 ENCOUNTER — Telehealth: Payer: Self-pay | Admitting: Cardiology

## 2019-01-16 NOTE — Telephone Encounter (Signed)
Patient would like results of monitor that she wore.Marland Kitchen

## 2019-01-20 ENCOUNTER — Other Ambulatory Visit: Payer: Medicare Other

## 2019-01-20 ENCOUNTER — Telehealth: Payer: Self-pay

## 2019-01-20 NOTE — Telephone Encounter (Signed)
Patient informed of monitor results and still having episodes of near syncope. She was unaware that she had an echo scheduled today. Echo appt rescheduled for 01/22/19 at 1:15 pm.

## 2019-01-20 NOTE — Telephone Encounter (Signed)
Patient called and notified of test results and also patient has still been complaining of blacking out and feeling dizzy would like to know what to do.

## 2019-01-20 NOTE — Telephone Encounter (Signed)
-----   Message from Rajan R Revankar, MD sent at 01/17/2019  5:15 PM EDT ----- The results of the study is unremarkable. Please inform patient. I will discuss in detail at next appointment. Cc  primary care/referring physician Rajan R Revankar, MD 01/17/2019 5:15 PM 

## 2019-01-21 NOTE — Telephone Encounter (Signed)
Did she black out during the time of monitoring?

## 2019-01-22 ENCOUNTER — Ambulatory Visit (INDEPENDENT_AMBULATORY_CARE_PROVIDER_SITE_OTHER): Payer: Medicare Other

## 2019-01-22 ENCOUNTER — Other Ambulatory Visit: Payer: Self-pay

## 2019-01-22 ENCOUNTER — Telehealth: Payer: Self-pay | Admitting: Cardiology

## 2019-01-22 DIAGNOSIS — I10 Essential (primary) hypertension: Secondary | ICD-10-CM

## 2019-01-22 NOTE — Telephone Encounter (Signed)
Overall echo is good and I suspect her SOB is not cardiac.

## 2019-01-22 NOTE — Telephone Encounter (Signed)
Patient states she still has SOB and a hx of asthma. She used her rescue inhaler past couple of days. Symptoms resolved but she is not certain if this is asthma related? Still waiting on echo results. Note forwarded to Dr. Jerilynn Mages and Dr. Raliegh Ip for review because Dr. Alfonso Patten is out of office.

## 2019-01-22 NOTE — Telephone Encounter (Signed)
Please call about shortness of breath

## 2019-01-22 NOTE — Progress Notes (Signed)
Complete echocardiogram has been performed.  Jimmy Danalee Flath RDCS, RVT 

## 2019-01-23 NOTE — Telephone Encounter (Signed)
Patient informed of echo results and she states she will see her pulmonologist. She has seen Dr. Henrene Pastor and Dr. Geraldo Pitter concerning issues with syncope/dizziness all testing negative. She has her rescue inhaler and will call office to get appt with her asthma specialist today.

## 2019-01-27 NOTE — Telephone Encounter (Signed)
Bring him in and take a look

## 2019-02-06 DIAGNOSIS — R0602 Shortness of breath: Secondary | ICD-10-CM | POA: Diagnosis not present

## 2019-02-06 DIAGNOSIS — J4520 Mild intermittent asthma, uncomplicated: Secondary | ICD-10-CM | POA: Diagnosis not present

## 2019-02-06 LAB — PULMONARY FUNCTION TEST

## 2019-02-11 ENCOUNTER — Ambulatory Visit: Payer: Medicare Other | Admitting: Cardiology

## 2019-02-20 DIAGNOSIS — J984 Other disorders of lung: Secondary | ICD-10-CM | POA: Diagnosis not present

## 2019-02-24 ENCOUNTER — Ambulatory Visit: Payer: Medicare Other | Admitting: Allergy and Immunology

## 2019-03-17 ENCOUNTER — Other Ambulatory Visit: Payer: Self-pay | Admitting: Allergy and Immunology

## 2019-03-18 DIAGNOSIS — L03211 Cellulitis of face: Secondary | ICD-10-CM | POA: Diagnosis not present

## 2019-03-18 DIAGNOSIS — L247 Irritant contact dermatitis due to plants, except food: Secondary | ICD-10-CM | POA: Diagnosis not present

## 2019-03-19 DIAGNOSIS — N3281 Overactive bladder: Secondary | ICD-10-CM | POA: Diagnosis not present

## 2019-03-19 DIAGNOSIS — R339 Retention of urine, unspecified: Secondary | ICD-10-CM | POA: Diagnosis not present

## 2019-03-19 DIAGNOSIS — N952 Postmenopausal atrophic vaginitis: Secondary | ICD-10-CM | POA: Diagnosis not present

## 2019-03-28 DIAGNOSIS — G43009 Migraine without aura, not intractable, without status migrainosus: Secondary | ICD-10-CM | POA: Diagnosis not present

## 2019-03-28 DIAGNOSIS — E782 Mixed hyperlipidemia: Secondary | ICD-10-CM | POA: Diagnosis not present

## 2019-03-28 DIAGNOSIS — K2100 Gastro-esophageal reflux disease with esophagitis, without bleeding: Secondary | ICD-10-CM | POA: Diagnosis not present

## 2019-03-28 DIAGNOSIS — J4520 Mild intermittent asthma, uncomplicated: Secondary | ICD-10-CM | POA: Diagnosis not present

## 2019-03-28 DIAGNOSIS — M150 Primary generalized (osteo)arthritis: Secondary | ICD-10-CM | POA: Diagnosis not present

## 2019-03-31 ENCOUNTER — Encounter: Payer: Self-pay | Admitting: Allergy and Immunology

## 2019-05-12 DIAGNOSIS — M545 Low back pain: Secondary | ICD-10-CM | POA: Diagnosis not present

## 2019-05-12 DIAGNOSIS — K523 Indeterminate colitis: Secondary | ICD-10-CM | POA: Diagnosis not present

## 2019-05-12 DIAGNOSIS — J4520 Mild intermittent asthma, uncomplicated: Secondary | ICD-10-CM | POA: Diagnosis not present

## 2019-05-12 DIAGNOSIS — Z1159 Encounter for screening for other viral diseases: Secondary | ICD-10-CM | POA: Diagnosis not present

## 2019-07-06 DIAGNOSIS — H43393 Other vitreous opacities, bilateral: Secondary | ICD-10-CM | POA: Diagnosis not present

## 2019-07-21 DIAGNOSIS — N952 Postmenopausal atrophic vaginitis: Secondary | ICD-10-CM | POA: Diagnosis not present

## 2019-07-21 DIAGNOSIS — R339 Retention of urine, unspecified: Secondary | ICD-10-CM | POA: Diagnosis not present

## 2019-07-28 DIAGNOSIS — M159 Polyosteoarthritis, unspecified: Secondary | ICD-10-CM

## 2019-07-28 DIAGNOSIS — G43009 Migraine without aura, not intractable, without status migrainosus: Secondary | ICD-10-CM | POA: Insufficient documentation

## 2019-07-28 HISTORY — DX: Polyosteoarthritis, unspecified: M15.9

## 2019-07-28 HISTORY — DX: Migraine without aura, not intractable, without status migrainosus: G43.009

## 2019-07-29 ENCOUNTER — Ambulatory Visit (INDEPENDENT_AMBULATORY_CARE_PROVIDER_SITE_OTHER): Payer: Medicare Other | Admitting: Legal Medicine

## 2019-07-29 ENCOUNTER — Other Ambulatory Visit: Payer: Self-pay

## 2019-07-29 ENCOUNTER — Encounter: Payer: Self-pay | Admitting: Legal Medicine

## 2019-07-29 VITALS — BP 144/70 | HR 90 | Temp 97.4°F | Ht 60.0 in | Wt 146.0 lb

## 2019-07-29 DIAGNOSIS — M159 Polyosteoarthritis, unspecified: Secondary | ICD-10-CM

## 2019-07-29 DIAGNOSIS — E782 Mixed hyperlipidemia: Secondary | ICD-10-CM

## 2019-07-29 DIAGNOSIS — I10 Essential (primary) hypertension: Secondary | ICD-10-CM

## 2019-07-29 MED ORDER — CELECOXIB 100 MG PO CAPS: 100.0000 mg | ORAL_CAPSULE | Freq: Two times a day (BID) | ORAL | 3 refills | Status: DC

## 2019-07-29 MED ORDER — SIMVASTATIN 40 MG PO TABS: 40.0000 mg | ORAL_TABLET | Freq: Every day | ORAL | 2 refills | Status: DC

## 2019-07-29 NOTE — Assessment & Plan Note (Signed)
AN INDIVIDUAL CARE PLAN was established and reinforced today.  The patient's status was assessed using clinical findings on exam, lab and other diagnostic tests. The patient's disease status was assessed based on evidence-based guidelines and found to be good controlled. MEDICATIONS were reviewed. SELF MANAGEMENT GOALS have been discussed and patient's success at attaining the goal of low cholesterol was assessed. RECOMMENDATION given include regular exercise 3 days a week and low cholesterol/low fat diet. CLINICAL SUMMARY including written plan to identify barriers unique to the patient due to social or economic  reasons was discussed. 

## 2019-07-29 NOTE — Assessment & Plan Note (Signed)
An individual care plan was established and reinforced today.  The patient's status was assessed using clinical findings on exam and labs or diagnostic tests. The patient's success at meeting treatment goals on disease specific evidence-based guidelines and found to be wel controlled. SELF MANAGEMENT: The patient and I together assessed ways to personally work towards obtaining the recommended goals. RECOMMENDATIONS: avoid decongestants found in common cold remedies, decrease consumption of alcohol, perform routine monitoring of BP with home BP cuff, exercise, reduction of dietary salt, take medicines as prescribed, try not to miss doses and quit smoking.  Regular exercise and maintaining a healthy weight is needed.  Stress reduction may help. A CLINICAL SUMMARY including written plan identify barriers to care unique to individual due to social or financial issues.  We attempt to mutually creat solutions for individual and family understanding.

## 2019-07-29 NOTE — Assessment & Plan Note (Signed)
Patient is having worsening back pain relieved by celebrex.  His was ordered.  Continue back exercises

## 2019-07-29 NOTE — Progress Notes (Signed)
Established Patient Office Visit  Subjective:  Patient ID: Shannon Martin, female    DOB: 03-29-1941  Age: 79 y.o. MRN: [id number]  CC:  Chief Complaint  Patient presents with  . Gastroesophageal Reflux  . Hyperlipidemia  . Asthma  . Osteoarthritis    HPI Shannon Martin presents for cHRONIC VISIT  Patient has gastroesophageal reflux symptoms without esophagitis and LTRD.  The symptoms are moderate intensity.  Length of symptoms 20 years.  Medicines include pepcid 40mg .  Complications include none  Patient presents with hyperlipidemia.  Compliance with treatment has been good; patient takes medicines as directed, maintains low cholesterol diet, follows up as directed, and maintains exercise regimen.  Patient is using no medicines without problems.  Patient has moderate intermittant asthma uncomoicated.  Asthma was diagnosed child and has occasional daytime episodes and no night symptoms.  Patient is not having an acute attack and is using Symbicort and Proair.  Patient does not smoke.  No symptoms today.  Patient is having increased low back ain and relived by celebrex 200mg  bid.  .   Past Medical History:  Diagnosis Date  . Allergic rhinitis 02/16/2015  . Arthritis    "knees, hands" (01/12/2015)  . Asthma    "mild"  . Duodenal ulcer    Admitted to hospital twice with this.  . Environmental and seasonal allergies   . GERD (gastroesophageal reflux disease)   . History of blood transfusion 06/2013   "during hospitalization"  . Hyperlipidemia   . Iliotibial band syndrome of left side 06/17/2015  . 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 Left knee pain 10/01/2014  . Migraines 1943 to present   "fairly frequently"; Takes Propranolol (01/12/2015)  . 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  . Overactive bladder    Takes Vesicare  . Primary osteoarthritis of left knee 12/03/2014  . 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 BREAST BIOPSY Left ~ 1973  . CATARACT EXTRACTION W/ INTRAOCULAR LENS  IMPLANT, BILATERAL Bilateral 2003  . CHEST TUBE INSERTION  January 2015   X 2 at [facility]  . DILATION AND CURETTAGE OF UTERUS  4-5  . JOINT REPLACEMENT    . REPLACEMENT TOTAL KNEE Left   . TONSILLECTOMY AND ADENOIDECTOMY  1946; 1947  . TOTAL KNEE ARTHROPLASTY Left 01/11/2015  . TOTAL KNEE ARTHROPLASTY Left 01/11/2015   Procedure: Left TOTAL KNEE ARTHROPLASTY;  Surgeon: 01/13/2015, MD;  Location: [facility] OR;  Service: Orthopedics;  Laterality: Left;  01/13/2015 VIDEO ASSISTED THORACOSCOPY (VATS)/THOROCOTOMY Left 07/13/2013   VATS with insertion of chest tubes    No family history on file.  Social History   Socioeconomic History  . Marital status: Widowed    Spouse name: Not on file  . Number of children: Not on file  . Years of education: Not on file  . Highest education level: Not on file  Occupational History  . Not on file  Tobacco Use  . Smoking status: Never Smoker  . Smokeless tobacco: Never Used  Substance and Sexual Activity  . Alcohol use: Yes    Alcohol/week: 1.0 standard drinks    Types: 1 Glasses of wine per week  . Drug use: No  . Sexual activity: Never  Other Topics Concern  . Not on file  Social History Narrative  . Not on file   Social Determinants of Health   Financial Resource Strain:   . Difficulty of Paying  Living Expenses: Not on file  Food Insecurity:   . Worried About Charity fundraiser in the Last Year: Not on file  . Ran Out of Food in the Last Year: Not on file  Transportation Needs:   . Lack of Transportation (Medical): Not on file  . Lack of Transportation (Non-Medical): Not on file  Physical Activity:   . Days of Exercise per Week: Not on file  . Minutes of Exercise per Session: Not on file  Stress:   . Feeling of Stress : Not on file  Social Connections:   . Frequency of Communication with Friends and Family: Not on file  . Frequency of  Social Gatherings with Friends and Family: Not on file  . Attends Religious Services: Not on file  . Active Member of Clubs or Organizations: Not on file  . Attends Archivist Meetings: Not on file  . Marital Status: Not on file  Intimate Partner Violence:   . Fear of Current or Ex-Partner: Not on file  . Emotionally Abused: Not on file  . Physically Abused: Not on file  . Sexually Abused: Not on file    Outpatient Medications Prior to Visit  Medication Sig Dispense Refill  . albuterol (PROVENTIL HFA;VENTOLIN HFA) 108 (90 Base) MCG/ACT inhaler Inhale two puffs every four to six hours as needed for cough or wheeze. 1 Inhaler 1  . Ascorbic Acid (VITAMIN C PO) Take 1 tablet by mouth daily.     . Calcium Carb-Cholecalciferol (CALCIUM + D3) 600-200 MG-UNIT TABS Take 1 tablet by mouth 2 (two) times daily.    Marland Kitchen estradiol (ESTRACE) 0.1 MG/GM vaginal cream Place 1 Applicatorful vaginally at bedtime.    . famotidine (PEPCID) 40 MG tablet Take 40 mg by mouth 2 (two) times daily.    . fluticasone (FLONASE) 50 MCG/ACT nasal spray Can use one spray in each nostril once daily as directed. 16 g 0  . HYDROcodone-acetaminophen (NORCO/VICODIN) 5-325 MG tablet every 8 (eight) hours as needed.   0  . Hypromellose (ARTIFICIAL TEARS OP) Apply 1 drop to eye as needed.    . loratadine (CLARITIN) 10 MG tablet Take 10 mg by mouth daily.    . Multiple Vitamins-Minerals (MULTIVITAMIN PO) Take 1 tablet by mouth daily.    . tamsulosin (FLOMAX) 0.4 MG CAPS capsule Take 0.4 mg by mouth daily.    Marland Kitchen VITAMIN E PO Take 1 capsule by mouth as needed.    Marland Kitchen estradiol (ESTRACE) 0.1 MG/GM vaginal cream Place 1 Applicatorful vaginally 3 (three) times a week.    . simvastatin (ZOCOR) 40 MG tablet Take 40 mg by mouth daily.    . Fluticasone Furoate (ARNUITY ELLIPTA) 100 MCG/ACT AEPB Inhale 1 Dose into the lungs daily. (Patient not taking: Reported on 07/29/2019) 30 each 3  . SYMBICORT 80-4.5 MCG/ACT inhaler      No  facility-administered medications prior to visit.    Allergies  Allergen Reactions  . Tetanus Toxoids Swelling and Other (See Comments)    Swelling around throat and jaws  . Bee Venom Swelling and Other (See Comments)    Crusty area on skin  . Hornet Venom Swelling    Other reaction(s): Other (See Comments) Crusty area on skin  . Dilaudid [Hydromorphone] Nausea And Vomiting    Causes nausea and vomiting  . Erythromycin Nausea And Vomiting  . Fiorinal [Butalbital-Aspirin-Caffeine]     Altered mental status  . Amoxicillin Other (See Comments)    Upset stomach  . Doxycycline  Other (See Comments)    Upset stomach  . Latex Other (See Comments)    Skin will crack open and bleed  . Neomycin Other (See Comments)    Irritates skin  . Penicillins Itching, Swelling and Rash    ROS Review of Systems  Constitutional: Negative.   HENT: Negative.   Eyes: Negative.   Respiratory: Negative.   Endocrine: Negative.   Genitourinary: Negative.   Musculoskeletal: Positive for back pain.  Skin: Negative.   Hematological: Negative.   Psychiatric/Behavioral: Negative.       Objective:    Physical Exam  Constitutional: She is oriented to person, place, and time. She appears well-developed and well-nourished.  HENT:  Head: Normocephalic and atraumatic.  Eyes: Pupils are equal, round, and reactive to light. Conjunctivae and EOM are normal.  Cardiovascular: Normal rate and regular rhythm.  Pulmonary/Chest: Effort normal and breath sounds normal.  Abdominal: Soft. Bowel sounds are normal.  Musculoskeletal:        General: Tenderness present. Normal range of motion.     Cervical back: Normal range of motion and neck supple.     Thoracic back: Tenderness present.     Lumbar back: Tenderness present.  Neurological: She is alert and oriented to person, place, and time. She has normal reflexes.  Skin: Skin is warm and dry.    BP (!) 144/70 (BP Location: Right Arm, Patient Position:  Sitting)   Pulse 90   Temp (!) 97.4 F (36.3 C) (Temporal)   Ht 5' (1.524 m)   Wt 146 lb (66.2 kg)   SpO2 94%   BMI 28.51 kg/m  Wt Readings from Last 3 Encounters:  07/29/19 146 lb (66.2 kg)  12/11/18 160 lb (72.6 kg)  03/29/17 177 lb 3.2 oz (80.4 kg)     Health Maintenance Due  Topic Date Due  . TETANUS/TDAP  11/22/1960  . DEXA SCAN  11/23/2006  . INFLUENZA VACCINE  01/25/2019    There are no preventive care reminders to display for this patient.  No results found for: TSH Lab Results  Component Value Date   WBC 9.5 01/13/2015   HGB 9.8 (L) 01/13/2015   HCT 28.6 (L) 01/13/2015   MCV 90.2 01/13/2015   PLT 236 01/13/2015   Lab Results  Component Value Date   NA 134 (L) 01/12/2015   K 3.5 01/12/2015   CO2 26 01/12/2015   GLUCOSE 125 (H) 01/12/2015   BUN 13 01/12/2015   CREATININE 0.73 01/12/2015   BILITOT 0.1 (L) 01/01/2015   ALKPHOS 91 01/01/2015   AST 26 01/01/2015   ALT 22 01/01/2015   PROT 7.5 01/01/2015   ALBUMIN 4.2 01/01/2015   CALCIUM 8.5 (L) 01/12/2015   ANIONGAP 7 01/12/2015   No results found for: CHOL No results found for: HDL No results found for: LDLCALC No results found for: TRIG No results found for: CHOLHDL No results found for: XVQM0Q    Assessment & Plan:   Problem List Items Addressed This Visit      Cardiovascular and Mediastinum   Essential hypertension    An individual care plan was established and reinforced today.  The patient's status was assessed using clinical findings on exam and labs or diagnostic tests. The patient's success at meeting treatment goals on disease specific evidence-based guidelines and found to be wel controlled. SELF MANAGEMENT: The patient and I together assessed ways to personally work towards obtaining the recommended goals. RECOMMENDATIONS: avoid decongestants found in common cold remedies, decrease consumption of alcohol,  perform routine monitoring of BP with home BP cuff, exercise, reduction of  dietary salt, take medicines as prescribed, try not to miss doses and quit smoking.  Regular exercise and maintaining a healthy weight is needed.  Stress reduction may help. A CLINICAL SUMMARY including written plan identify barriers to care unique to individual due to social or financial issues.  We attempt to mutually creat solutions for individual and family understanding.      Relevant Medications   simvastatin (ZOCOR) 40 MG tablet   Other Relevant Orders   CBC with Differential   COMPLETE METABOLIC PANEL WITH GFR     Musculoskeletal and Integument   Generalized osteoarthritis    Patient is having worsening back pain relieved by celebrex.  His was ordered.  Continue back exercises      Relevant Medications   celecoxib (CELEBREX) 100 MG capsule     Other   Mixed hyperlipidemia - Primary (Chronic)    AN INDIVIDUAL CARE PLAN was established and reinforced today.  The patient's status was assessed using clinical findings on exam, lab and other diagnostic tests. The patient's disease status was assessed based on evidence-based guidelines and found to be good controlled. MEDICATIONS were reviewed. SELF MANAGEMENT GOALS have been discussed and patient's success at attaining the goal of low cholesterol was assessed. RECOMMENDATION given include regular exercise 3 days a week and low cholesterol/low fat diet. CLINICAL SUMMARY including written plan to identify barriers unique to the patient due to social or economic  reasons was discussed.      Relevant Medications   simvastatin (ZOCOR) 40 MG tablet   Other Relevant Orders   Lipid Panel      Meds ordered this encounter  Medications  . celecoxib (CELEBREX) 100 MG capsule    Sig: Take 1 capsule (100 mg total) by mouth 2 (two) times daily.    Dispense:  60 capsule    Refill:  3  . simvastatin (ZOCOR) 40 MG tablet    Sig: Take 1 tablet (40 mg total) by mouth daily.    Dispense:  90 tablet    Refill:  2    Follow-up: Return in  about 4 months (around 11/26/2019).    Brent Bulla, MD

## 2019-07-29 NOTE — Patient Instructions (Signed)

## 2019-07-30 LAB — CBC WITH DIFFERENTIAL/PLATELET
Basophils Absolute: 0.1 10*3/uL (ref 0.0–0.2)
Basos: 1 %
EOS (ABSOLUTE): 0.3 10*3/uL (ref 0.0–0.4)
Eos: 4 %
Hematocrit: 36.6 % (ref 34.0–46.6)
Hemoglobin: 12.6 g/dL (ref 11.1–15.9)
Immature Grans (Abs): 0 10*3/uL (ref 0.0–0.1)
Immature Granulocytes: 0 %
Lymphocytes Absolute: 2.3 10*3/uL (ref 0.7–3.1)
Lymphs: 35 %
MCH: 30.5 pg (ref 26.6–33.0)
MCHC: 34.4 g/dL (ref 31.5–35.7)
MCV: 89 fL (ref 79–97)
Monocytes Absolute: 0.5 10*3/uL (ref 0.1–0.9)
Monocytes: 7 %
Neutrophils Absolute: 3.5 10*3/uL (ref 1.4–7.0)
Neutrophils: 53 %
Platelets: 289 10*3/uL (ref 150–450)
RBC: 4.13 x10E6/uL (ref 3.77–5.28)
RDW: 12.6 % (ref 11.7–15.4)
WBC: 6.7 10*3/uL (ref 3.4–10.8)

## 2019-07-30 LAB — LIPID PANEL
Chol/HDL Ratio: 2.1 ratio (ref 0.0–4.4)
Cholesterol, Total: 150 mg/dL (ref 100–199)
HDL: 71 mg/dL (ref >39)
LDL Chol Calc (NIH): 64 mg/dL (ref 0–99)
Triglycerides: 79 mg/dL (ref 0–149)
VLDL Cholesterol Cal: 15 mg/dL (ref 5–40)

## 2019-07-30 LAB — CARDIOVASCULAR RISK ASSESSMENT

## 2019-08-25 ENCOUNTER — Telehealth: Payer: Self-pay

## 2019-08-25 NOTE — Telephone Encounter (Signed)
We need to see patient for exam lp

## 2019-08-25 NOTE — Telephone Encounter (Signed)
Appointment made Wednesday at 9

## 2019-08-25 NOTE — Telephone Encounter (Signed)
Patient passed out and started shaking, it was a very quick episode while she was on the couch playing a game on her phone but daughter said it reminded her of when her father would have a TIA. Daughter wants to know if you want to see in office or have her go to hospital or order image. She has history of syncope and collapse.

## 2019-08-27 ENCOUNTER — Other Ambulatory Visit: Payer: Self-pay

## 2019-08-27 ENCOUNTER — Ambulatory Visit (INDEPENDENT_AMBULATORY_CARE_PROVIDER_SITE_OTHER): Payer: Medicare Other | Admitting: Legal Medicine

## 2019-08-27 ENCOUNTER — Encounter: Payer: Self-pay | Admitting: Legal Medicine

## 2019-08-27 VITALS — BP 120/82 | HR 56 | Temp 97.3°F | Ht 60.0 in | Wt 148.0 lb

## 2019-08-27 DIAGNOSIS — R55 Syncope and collapse: Secondary | ICD-10-CM | POA: Diagnosis not present

## 2019-08-27 DIAGNOSIS — Z0000 Encounter for general adult medical examination without abnormal findings: Secondary | ICD-10-CM | POA: Diagnosis not present

## 2019-08-27 DIAGNOSIS — I10 Essential (primary) hypertension: Secondary | ICD-10-CM

## 2019-08-27 DIAGNOSIS — E782 Mixed hyperlipidemia: Secondary | ICD-10-CM | POA: Diagnosis not present

## 2019-08-27 NOTE — Assessment & Plan Note (Signed)
An individual care plan was established and reinforced today.  The patient's status was assessed using clinical findings on exam and labs or diagnostic tests. The patient's success at meeting treatment goals on disease specific evidence-based guidelines and found to be fair controlled. SELF MANAGEMENT: The patient and I together assessed ways to personally work towards obtaining the recommended goals. RECOMMENDATIONS: avoid decongestants found in common cold remedies, decrease consumption of alcohol, perform routine monitoring of BP with home BP cuff, exercise, reduction of dietary salt, take medicines as prescribed, try not to miss doses and quit smoking.  Regular exercise and maintaining a healthy weight is needed.  Stress reduction may help. A CLINICAL SUMMARY including written plan identify barriers to care unique to individual due to social or financial issues.  We attempt to mutually creat solutions for individual and family understanding.  

## 2019-08-27 NOTE — Assessment & Plan Note (Signed)

## 2019-08-27 NOTE — Progress Notes (Addendum)
Acute Office Visit  Subjective:    Patient ID: Shannon Martin, female    DOB: [birth year], 79 y.o.   MRN: [id number]  Chief Complaint  Patient presents with  . Loss of Consciousness    HPI Patient is in today for syncope for short time.  Syncope: patient was sitting down and then loss consciousness seen by daughter.  Some clonus.  No post ictal stage.  No loss of urine.  She had cardiac workup in summer and was negative.  No tachycardia symptoms.  She has had these several times up to 12.  Past Medical History:  Diagnosis Date  . Allergic rhinitis 02/16/2015  . Arthritis    "knees, hands" (01/12/2015)  . Asthma    "mild"  . Duodenal ulcer    Admitted to hospital twice with this.  . Environmental and seasonal allergies   . GERD (gastroesophageal reflux disease)   . History of blood transfusion 06/2013   "during hospitalization"  . Hyperlipidemia   . Iliotibial band syndrome of left side 06/17/2015  . 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 Left knee pain 10/01/2014  . Migraines 1943 to present   "fairly frequently"; Takes Propranolol (01/12/2015)  . 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  . Overactive bladder    Takes Vesicare  . Primary osteoarthritis of left knee 12/03/2014  . 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    Past Surgical History:  Procedure Laterality Date  . BREAST BIOPSY Left ~ 1973  . CATARACT EXTRACTION W/ INTRAOCULAR LENS  IMPLANT, BILATERAL Bilateral 2003  . CHEST TUBE INSERTION  January 2015   X 2 at [place]  4-5  . JOINT REPLACEMENT    . REPLACEMENT TOTAL KNEE Left   . TONSILLECTOMY AND ADENOIDECTOMY  1946; 1947  . TOTAL KNEE ARTHROPLASTY Left 01/11/2015  . TOTAL KNEE ARTHROPLASTY Left 01/11/2015   Procedure: Left TOTAL KNEE ARTHROPLASTY;  Surgeon: Vickey Huger, MD;  Location: [facility];  Service: Orthopedics;  Laterality: Left;  Marland Kitchen VIDEO ASSISTED THORACOSCOPY (VATS)/THOROCOTOMY  Left 07/13/2013   VATS with insertion of chest tubes    History reviewed. No pertinent family history.  Social History   Socioeconomic History  . Marital status: Widowed    Spouse name: Not on file  . Number of children: Not on file  . Years of education: Not on file  . Highest education level: Not on file  Occupational History  . Not on file  Tobacco Use  . Smoking status: Never Smoker  . Smokeless tobacco: Never Used  Substance and Sexual Activity  . Alcohol use: Yes    Alcohol/week: 1.0 standard drinks    Types: 1 Glasses of wine per week  . Drug use: No  . Sexual activity: Never  Other Topics Concern  . Not on file  Social History Narrative  . Not on file   Social Determinants of Health   Financial Resource Strain:   . Difficulty of Paying Living Expenses: Not on file  Food Insecurity:   . Worried About Charity fundraiser in the Last Year: Not on file  . Ran Out of Food in the Last Year: Not on file  Transportation Needs:   . Lack of Transportation (Medical): Not on file  . Lack of Transportation (Non-Medical): Not on file  Physical Activity:   . Days of Exercise per Week: Not on file  . Minutes of Exercise per Session: Not on file  Stress:   .  Feeling of Stress : Not on file  Social Connections:   . Frequency of Communication with Friends and Family: Not on file  . Frequency of Social Gatherings with Friends and Family: Not on file  . Attends Religious Services: Not on file  . Active Member of Clubs or Organizations: Not on file  . Attends Banker Meetings: Not on file  . Marital Status: Not on file  Intimate Partner Violence:   . Fear of Current or Ex-Partner: Not on file  . Emotionally Abused: Not on file  . Physically Abused: Not on file  . Sexually Abused: Not on file    Outpatient Medications Prior to Visit  Medication Sig Dispense Refill  . Ascorbic Acid (VITAMIN C) 1000 MG tablet Take 1,000 mg by mouth daily.    . Ca & Phos-Vit  D-Mag (CALCIUM) [phone] TABS Take by mouth.    . Cholecalciferol (VITAMIN D3) 250 MCG (10000 UT) TABS Take by mouth.    Marland Kitchen albuterol (PROVENTIL HFA;VENTOLIN HFA) 108 (90 Base) MCG/ACT inhaler Inhale two puffs every four to six hours as needed for cough or wheeze. 1 Inhaler 1  . Ascorbic Acid (VITAMIN C PO) Take 1 tablet by mouth daily.     . Calcium Carb-Cholecalciferol (CALCIUM + D3) 600-200 MG-UNIT TABS Take 1 tablet by mouth 2 (two) times daily.    . celecoxib (CELEBREX) 100 MG capsule Take 1 capsule (100 mg total) by mouth 2 (two) times daily. 60 capsule 3  . estradiol (ESTRACE) 0.1 MG/GM vaginal cream Place 1 Applicatorful vaginally at bedtime.    . famotidine (PEPCID) 40 MG tablet Take 40 mg by mouth 2 (two) times daily.    . fluticasone (FLONASE) 50 MCG/ACT nasal spray Can use one spray in each nostril once daily as directed. 16 g 0  . Fluticasone Furoate (ARNUITY ELLIPTA) 100 MCG/ACT AEPB Inhale 1 Dose into the lungs daily. (Patient not taking: Reported on 07/29/2019) 30 each 3  . HYDROcodone-acetaminophen (NORCO/VICODIN) 5-325 MG tablet every 8 (eight) hours as needed.   0  . Hypromellose (ARTIFICIAL TEARS OP) Apply 1 drop to eye as needed.    . loratadine (CLARITIN) 10 MG tablet Take 10 mg by mouth daily.    . Multiple Vitamins-Minerals (MULTIVITAMIN PO) Take 1 tablet by mouth daily.    . mupirocin ointment (BACTROBAN) 2 % APP SML AMT EXT AA TID    . simvastatin (ZOCOR) 40 MG tablet Take 1 tablet (40 mg total) by mouth daily. 90 tablet 2  . SYMBICORT 80-4.5 MCG/ACT inhaler     . tamsulosin (FLOMAX) 0.4 MG CAPS capsule Take 0.4 mg by mouth daily.    Marland Kitchen VITAMIN E PO Take 1 capsule by mouth as needed.     No facility-administered medications prior to visit.    Allergies  Allergen Reactions  . Tetanus Toxoids Swelling and Other (See Comments)    Swelling around throat and jaws  . Bee Venom Swelling and Other (See Comments)    Crusty area on skin  . Hornet Venom Swelling     Other reaction(s): Other (See Comments) Crusty area on skin  . Dilaudid [Hydromorphone] Nausea And Vomiting    Causes nausea and vomiting  . Erythromycin Nausea And Vomiting  . Fiorinal [Butalbital-Aspirin-Caffeine]     Altered mental status  . Amoxicillin Other (See Comments)    Upset stomach  . Doxycycline Other (See Comments)    Upset stomach  . Latex Other (See Comments)    Skin will crack  open and bleed  . Neomycin Other (See Comments)    Irritates skin  . Penicillins Itching, Swelling and Rash    Review of Systems  Constitutional: Negative.   HENT: Negative.   Eyes: Negative.   Respiratory: Negative.   Cardiovascular: Negative.   Gastrointestinal: Negative.   Endocrine: Negative.   Genitourinary: Negative.   Musculoskeletal: Negative.   Skin: Negative.   Neurological: Negative.   Psychiatric/Behavioral: Negative.        Objective:    Physical Exam Vitals reviewed.  Constitutional:      Appearance: Normal appearance.  HENT:     Head: Normocephalic and atraumatic.     Right Ear: Tympanic membrane normal.     Left Ear: Tympanic membrane normal.  Cardiovascular:     Rate and Rhythm: Normal rate and regular rhythm.     Pulses: Normal pulses.          Carotid pulses are 2+ on the right side and 2+ on the left side.      Radial pulses are 2+ on the right side and 2+ on the left side.       Femoral pulses are 2+ on the right side and 2+ on the left side.      Popliteal pulses are 2+ on the right side and 2+ on the left side.       Dorsalis pedis pulses are 2+ on the right side and 2+ on the left side.       Posterior tibial pulses are 2+ on the right side and 2+ on the left side.     Heart sounds: Normal heart sounds.     Comments: Right carotid bruit Pulmonary:     Effort: Pulmonary effort is normal.     Breath sounds: Normal breath sounds.  Musculoskeletal:        General: Normal range of motion.     Cervical back: Normal range of motion and neck supple.    Skin:    General: Skin is warm and dry.  Neurological:     General: No focal deficit present.     Mental Status: She is alert and oriented to person, place, and time. Mental status is at baseline.     Cranial Nerves: Cranial nerves are intact.     Sensory: Sensation is intact.     Motor: Motor function is intact.     Coordination: Coordination is intact.     Gait: Tandem walk abnormal.     Deep Tendon Reflexes: Reflexes are normal and symmetric.     Comments: Unstable rhomberg     BP 120/82   Pulse (!) 56   Temp (!) 97.3 F (36.3 C)   Ht 5' (1.524 m)   Wt 148 lb (67.1 kg)   SpO2 96%   BMI 28.90 kg/m  Wt Readings from Last 3 Encounters:  08/27/19 148 lb (67.1 kg)  07/29/19 146 lb (66.2 kg)  12/11/18 160 lb (72.6 kg)    Health Maintenance Due  Topic Date Due  . TETANUS/TDAP  11/22/1960  . DEXA SCAN  11/23/2006  . MAMMOGRAM  06/26/2019    There are no preventive care reminders to display for this patient.   No results found for: TSH Lab Results  Component Value Date   WBC 6.7 07/29/2019   HGB 12.6 07/29/2019   HCT 36.6 07/29/2019   MCV 89 07/29/2019   PLT 289 07/29/2019   Lab Results  Component Value Date   NA 134 (L) 01/12/2015  K 3.5 01/12/2015   CO2 26 01/12/2015   GLUCOSE 125 (H) 01/12/2015   BUN 13 01/12/2015   CREATININE 0.73 01/12/2015   BILITOT 0.1 (L) 01/01/2015   ALKPHOS 91 01/01/2015   AST 26 01/01/2015   ALT 22 01/01/2015   PROT 7.5 01/01/2015   ALBUMIN 4.2 01/01/2015   CALCIUM 8.5 (L) 01/12/2015   ANIONGAP 7 01/12/2015   Lab Results  Component Value Date   CHOL 150 07/29/2019   Lab Results  Component Value Date   HDL 71 07/29/2019   Lab Results  Component Value Date   LDLCALC 64 07/29/2019   Lab Results  Component Value Date   TRIG 79 07/29/2019   Lab Results  Component Value Date   CHOLHDL 2.1 07/29/2019   No results found for: HGBA1C     Assessment & Plan:   Problem List Items Addressed This Visit       Cardiovascular and Mediastinum   Syncope and collapse - Primary    History of recurrent syncope at rest.  Negative cardiac workup, new right carotid bruit found and will need dopplers and CT head to rule ot stroke.  She may needs vascular surgery consult.  In the mean time, take one ASA qd.      Relevant Orders   VAS US CAROTID   CT Head Wo Contrast   EKG 12-Lead   Essential hypertension    An individual care plan was established and reinforced today.  The patient's status was assessed using clinical findings on exam and labs or diagnostic tests. The patient's success at meeting treatment goals on disease specific evidence-based guidelines and found to be fair controlled. SELF MANAGEMENT: The patient and I together assessed ways to personally work towards obtaining the recommended goals. RECOMMENDATIONS: avoid decongestants found in common cold remedies, decrease consumption of alcohol, perform routine monitoring of BP with home BP cuff, exercise, reduction of dietary salt, take medicines as prescribed, try not to miss doses and quit smoking.  Regular exercise and maintaining a healthy weight is needed.  Stress reduction may help. A CLINICAL SUMMARY including written plan identify barriers to care unique to individual due to social or financial issues.  We attempt to mutually creat solutions for individual and family understanding.        Other   Mixed hyperlipidemia (Chronic)    AN INDIVIDUAL CARE PLAN was established and reinforced today.  The patient's status was assessed using clinical findings on exam, lab and other diagnostic tests. The patient's disease status was assessed based on evidence-based guidelines and found to be well controlled. MEDICATIONS were reviewed. SELF MANAGEMENT GOALS have been discussed and patient's success at attaining the goal of low cholesterol was assessed. RECOMMENDATION given include regular exercise 3 days a week and low cholesterol/low fat diet. CLINICAL  SUMMARY including written plan to identify barriers unique to the patient due to social or economic  reasons was discussed.       EKG: rate 71 PR 174 qtc 429 Axis -32 No ischemia. Carotid dopplers scheduled as well as a Stat CT scan   No orders of the defined types were placed in this encounter.    Brent Bulla, MD

## 2019-08-27 NOTE — Assessment & Plan Note (Signed)
History of recurrent syncope at rest.  Negative cardiac workup, new right carotid bruit found and will need dopplers and CT head to rule ot stroke.  She may needs vascular surgery consult.  In the mean time, take one ASA qd.

## 2019-08-29 MED ORDER — AZITHROMYCIN 250 MG PO TABS: ORAL_TABLET | ORAL | 0 refills | Status: DC

## 2019-08-29 NOTE — Addendum Note (Signed)
Addended by: Brent Bulla on: 08/29/2019 11:35 AM   Modules accepted: Orders

## 2019-09-09 DIAGNOSIS — R55 Syncope and collapse: Secondary | ICD-10-CM | POA: Diagnosis not present

## 2019-09-09 DIAGNOSIS — I6523 Occlusion and stenosis of bilateral carotid arteries: Secondary | ICD-10-CM | POA: Diagnosis not present

## 2019-09-10 ENCOUNTER — Other Ambulatory Visit: Payer: Self-pay

## 2019-09-10 ENCOUNTER — Encounter: Payer: Self-pay | Admitting: Legal Medicine

## 2019-09-10 ENCOUNTER — Ambulatory Visit (INDEPENDENT_AMBULATORY_CARE_PROVIDER_SITE_OTHER): Payer: Medicare Other | Admitting: Legal Medicine

## 2019-09-10 DIAGNOSIS — R55 Syncope and collapse: Secondary | ICD-10-CM | POA: Diagnosis not present

## 2019-09-10 NOTE — Assessment & Plan Note (Signed)
No further syncope.  She is on one ASA a day.  Carotid doppler pending.  CT normal.  Decide on further workup depending on carotid studies.

## 2019-09-10 NOTE — Progress Notes (Signed)
Acute Office Visit  Subjective:    Patient ID: Shannon Martin, female    DOB: 10-19-1940, 79 y.o.   MRN: [id number]  Chief Complaint  Patient presents with  . Loss of Consciousness    HPI Patient is in today for follow up on syncope.  Her Ct scan was normal.  She had carotid dopper yesterday.  She is on aspirin one day and no further syncope.  Minimal orthostatics.  No neurologic symptoms.  Past Medical History:  Diagnosis Date  . Allergic rhinitis 02/16/2015  . Arthritis    "knees, hands" (01/12/2015)  . Asthma    "mild"  . Duodenal ulcer    Admitted to hospital twice with this.  . Environmental and seasonal allergies   . GERD (gastroesophageal reflux disease)   . History of blood transfusion 06/2013   "during hospitalization"  . Hyperlipidemia   . Iliotibial band syndrome of left side 06/17/2015  . 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 Left knee pain 10/01/2014  . Migraines 1943 to present   "fairly frequently"; Takes Propranolol (01/12/2015)  . 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  . Overactive bladder    Takes Vesicare  . Primary osteoarthritis of left knee 12/03/2014  . 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    Past Surgical History:  Procedure Laterality Date  . BREAST BIOPSY Left ~ 1973  . CATARACT EXTRACTION W/ INTRAOCULAR LENS  IMPLANT, BILATERAL Bilateral 2003  . CHEST TUBE INSERTION  January 2015   X 2 at [place]  4-5  . JOINT REPLACEMENT    . REPLACEMENT TOTAL KNEE Left   . TONSILLECTOMY AND ADENOIDECTOMY  1946; 1947  . TOTAL KNEE ARTHROPLASTY Left 01/11/2015  . TOTAL KNEE ARTHROPLASTY Left 01/11/2015   Procedure: Left TOTAL KNEE ARTHROPLASTY;  Surgeon: Vickey Huger, MD;  Location: [facility];  Service: Orthopedics;  Laterality: Left;  Marland Kitchen VIDEO ASSISTED THORACOSCOPY (VATS)/THOROCOTOMY Left 07/13/2013   VATS with insertion of chest tubes    History reviewed. No pertinent family  history.  Social History   Socioeconomic History  . Marital status: Widowed    Spouse name: Not on file  . Number of children: Not on file  . Years of education: Not on file  . Highest education level: Not on file  Occupational History  . Not on file  Tobacco Use  . Smoking status: Never Smoker  . Smokeless tobacco: Never Used  Substance and Sexual Activity  . Alcohol use: Yes    Alcohol/week: 1.0 standard drinks    Types: 1 Glasses of wine per week  . Drug use: No  . Sexual activity: Never  Other Topics Concern  . Not on file  Social History Narrative  . Not on file   Social Determinants of Health   Financial Resource Strain:   . Difficulty of Paying Living Expenses:   Food Insecurity:   . Worried About Charity fundraiser in the Last Year:   . Arboriculturist in the Last Year:   Transportation Needs:   . Film/video editor (Medical):   Marland Kitchen Lack of Transportation (Non-Medical):   Physical Activity:   . Days of Exercise per Week:   . Minutes of Exercise per Session:   Stress:   . Feeling of Stress :   Social Connections:   . Frequency of Communication with Friends and Family:   . Frequency of Social Gatherings with Friends and Family:   .  Attends Religious Services:   . Active Member of Clubs or Organizations:   . Attends Banker Meetings:   Marland Kitchen Marital Status:   Intimate Partner Violence:   . Fear of Current or Ex-Partner:   . Emotionally Abused:   Marland Kitchen Physically Abused:   . Sexually Abused:     Outpatient Medications Prior to Visit  Medication Sig Dispense Refill  . albuterol (PROVENTIL HFA;VENTOLIN HFA) 108 (90 Base) MCG/ACT inhaler Inhale two puffs every four to six hours as needed for cough or wheeze. 1 Inhaler 1  . Ascorbic Acid (VITAMIN C PO) Take 1 tablet by mouth daily.     . Ascorbic Acid (VITAMIN C) 1000 MG tablet Take 1,000 mg by mouth daily.    Marland Kitchen azithromycin (ZITHROMAX) 250 MG tablet 2 tablets on day 1, then 1 tablet daily on days  2-6.2 tablets on day 1, then 1 tablet daily on days 2-6. 6 tablet 0  . Ca & Phos-Vit D-Mag (CALCIUM) [phone] TABS Take by mouth.    . Calcium Carb-Cholecalciferol (CALCIUM + D3) 600-200 MG-UNIT TABS Take 1 tablet by mouth 2 (two) times daily.    . celecoxib (CELEBREX) 100 MG capsule Take 1 capsule (100 mg total) by mouth 2 (two) times daily. 60 capsule 3  . Cholecalciferol (VITAMIN D3) 250 MCG (10000 UT) TABS Take by mouth.    . estradiol (ESTRACE) 0.1 MG/GM vaginal cream Place 1 Applicatorful vaginally at bedtime.    . famotidine (PEPCID) 40 MG tablet Take 40 mg by mouth 2 (two) times daily.    . fluticasone (FLONASE) 50 MCG/ACT nasal spray Can use one spray in each nostril once daily as directed. 16 g 0  . Fluticasone Furoate (ARNUITY ELLIPTA) 100 MCG/ACT AEPB Inhale 1 Dose into the lungs daily. (Patient not taking: Reported on 07/29/2019) 30 each 3  . HYDROcodone-acetaminophen (NORCO/VICODIN) 5-325 MG tablet every 8 (eight) hours as needed.   0  . Hypromellose (ARTIFICIAL TEARS OP) Apply 1 drop to eye as needed.    . loratadine (CLARITIN) 10 MG tablet Take 10 mg by mouth daily.    . Multiple Vitamins-Minerals (MULTIVITAMIN PO) Take 1 tablet by mouth daily.    . mupirocin ointment (BACTROBAN) 2 % APP SML AMT EXT AA TID    . simvastatin (ZOCOR) 40 MG tablet Take 1 tablet (40 mg total) by mouth daily. 90 tablet 2  . SYMBICORT 80-4.5 MCG/ACT inhaler     . tamsulosin (FLOMAX) 0.4 MG CAPS capsule Take 0.4 mg by mouth daily.    Marland Kitchen VITAMIN E PO Take 1 capsule by mouth as needed.     No facility-administered medications prior to visit.    Allergies  Allergen Reactions  . Tetanus Toxoids Swelling and Other (See Comments)    Swelling around throat and jaws  . Bee Venom Swelling and Other (See Comments)    Crusty area on skin  . Hornet Venom Swelling    Other reaction(s): Other (See Comments) Crusty area on skin  . Dilaudid [Hydromorphone] Nausea And Vomiting    Causes nausea and vomiting    . Erythromycin Nausea And Vomiting  . Fiorinal [Butalbital-Aspirin-Caffeine]     Altered mental status  . Amoxicillin Other (See Comments)    Upset stomach  . Doxycycline Other (See Comments)    Upset stomach  . Latex Other (See Comments)    Skin will crack open and bleed  . Neomycin Other (See Comments)    Irritates skin  . Penicillins Itching, Swelling and  Rash    Review of Systems  Constitutional: Negative.   HENT: Negative.   Eyes: Negative.   Respiratory: Negative.   Cardiovascular: Negative.   Gastrointestinal: Negative.   Genitourinary: Negative.   Musculoskeletal: Positive for arthralgias.  Skin: Negative.   Neurological: Negative.   Psychiatric/Behavioral: Negative.        Objective:    Physical Exam Vitals reviewed.  Constitutional:      Appearance: Normal appearance.  HENT:     Head: Normocephalic and atraumatic.     Right Ear: Tympanic membrane normal.     Left Ear: Tympanic membrane normal.     Nose: Nose normal.     Mouth/Throat:     Mouth: Mucous membranes are dry.  Cardiovascular:     Rate and Rhythm: Normal rate and regular rhythm.     Heart sounds: Normal heart sounds.  Pulmonary:     Effort: Pulmonary effort is normal.     Breath sounds: Normal breath sounds.  Musculoskeletal:        General: Normal range of motion.     Cervical back: Normal range of motion and neck supple.  Skin:    General: Skin is warm and dry.     Capillary Refill: Capillary refill takes less than 2 seconds.  Neurological:     General: No focal deficit present.     Mental Status: She is alert and oriented to person, place, and time.     BP 118/72   Pulse 72   Temp (!) 97.5 F (36.4 C)   Resp 16   Ht 4' 11.25" (1.505 m)   Wt 145 lb 12.8 oz (66.1 kg)   SpO2 98%   BMI 29.20 kg/m  Wt Readings from Last 3 Encounters:  09/10/19 145 lb 12.8 oz (66.1 kg)  08/27/19 148 lb (67.1 kg)  07/29/19 146 lb (66.2 kg)    Health Maintenance Due  Topic Date Due  .  TETANUS/TDAP  Never done  . DEXA SCAN  Never done  . MAMMOGRAM  06/26/2019    There are no preventive care reminders to display for this patient.   No results found for: TSH Lab Results  Component Value Date   WBC 6.7 07/29/2019   HGB 12.6 07/29/2019   HCT 36.6 07/29/2019   MCV 89 07/29/2019   PLT 289 07/29/2019   Lab Results  Component Value Date   NA 134 (L) 01/12/2015   K 3.5 01/12/2015   CO2 26 01/12/2015   GLUCOSE 125 (H) 01/12/2015   BUN 13 01/12/2015   CREATININE 0.73 01/12/2015   BILITOT 0.1 (L) 01/01/2015   ALKPHOS 91 01/01/2015   AST 26 01/01/2015   ALT 22 01/01/2015   PROT 7.5 01/01/2015   ALBUMIN 4.2 01/01/2015   CALCIUM 8.5 (L) 01/12/2015   ANIONGAP 7 01/12/2015   Lab Results  Component Value Date   CHOL 150 07/29/2019   Lab Results  Component Value Date   HDL 71 07/29/2019   Lab Results  Component Value Date   LDLCALC 64 07/29/2019   Lab Results  Component Value Date   TRIG 79 07/29/2019   Lab Results  Component Value Date   CHOLHDL 2.1 07/29/2019   No results found for: HGBA1C     Assessment & Plan:   Problem List Items Addressed This Visit      Cardiovascular and Mediastinum   Syncope and collapse    No further syncope.  She is on one ASA a day.  Carotid doppler  pending.  CT normal.  Decide on further workup depending on carotid studies.          No orders of the defined types were placed in this encounter.    Brent Bulla, MD

## 2019-10-01 ENCOUNTER — Other Ambulatory Visit: Payer: Self-pay | Admitting: Legal Medicine

## 2019-10-13 ENCOUNTER — Other Ambulatory Visit: Payer: Self-pay

## 2019-10-13 MED ORDER — FAMOTIDINE 40 MG PO TABS: 40.0000 mg | ORAL_TABLET | Freq: Two times a day (BID) | ORAL | 1 refills | Status: DC

## 2019-11-26 ENCOUNTER — Other Ambulatory Visit: Payer: Self-pay

## 2019-11-26 ENCOUNTER — Encounter: Payer: Self-pay | Admitting: Legal Medicine

## 2019-11-26 ENCOUNTER — Ambulatory Visit (INDEPENDENT_AMBULATORY_CARE_PROVIDER_SITE_OTHER): Payer: Medicare Other | Admitting: Legal Medicine

## 2019-11-26 VITALS — BP 118/64 | HR 70 | Temp 97.2°F | Ht 60.0 in | Wt 140.8 lb

## 2019-11-26 DIAGNOSIS — K219 Gastro-esophageal reflux disease without esophagitis: Secondary | ICD-10-CM

## 2019-11-26 DIAGNOSIS — E782 Mixed hyperlipidemia: Secondary | ICD-10-CM | POA: Diagnosis not present

## 2019-11-26 DIAGNOSIS — I10 Essential (primary) hypertension: Secondary | ICD-10-CM | POA: Diagnosis not present

## 2019-11-26 DIAGNOSIS — Z6827 Body mass index (BMI) 27.0-27.9, adult: Secondary | ICD-10-CM | POA: Insufficient documentation

## 2019-11-26 DIAGNOSIS — Z6829 Body mass index (BMI) 29.0-29.9, adult: Secondary | ICD-10-CM | POA: Insufficient documentation

## 2019-11-26 DIAGNOSIS — M159 Polyosteoarthritis, unspecified: Secondary | ICD-10-CM | POA: Diagnosis not present

## 2019-11-26 HISTORY — DX: Body mass index (BMI) 29.0-29.9, adult: Z68.29

## 2019-11-26 HISTORY — DX: Body mass index (BMI) 27.0-27.9, adult: Z68.27

## 2019-11-26 NOTE — Progress Notes (Signed)
Established Patient Office Visit  Subjective:  Patient ID: Shannon Martin, female    DOB: 1941/05/26  Age: 79 y.o. MRN: [id number]  CC:  Chief Complaint  Patient presents with  . Hyperlipidemia  . Hypertension  . Gastroesophageal Reflux    HPI [name] presents for Chronic visit.  Patient presents for follow up of hypertension.  Patient tolerating diet well with side effects.  Patient was diagnosed with hypertension 2010 so has been treated for hypertension for 10 years.Patient is working on maintaining diet and exercise regimen and follows up as directed. Complication include none.  Patient presents with hyperlipidemia.  Compliance with treatment has been good; patient takes medicines as directed, maintains low cholesterol diet, follows up as directed, and maintains exercise regimen.  Patient is using simvastatin without problems.  Patient has gastroesophageal reflux symptoms withesophagitis and LTRD.  The symptoms are moderate intensity.  Length of symptoms 10 years.  Medicines include pepcid.  Complications include none.  Past Medical History:  Diagnosis Date  . Allergic rhinitis 02/16/2015  . Arthritis    "knees, hands" (01/12/2015)  . Asthma    "mild"  . Duodenal ulcer    Admitted to hospital twice with this.  . Environmental and seasonal allergies   . GERD (gastroesophageal reflux disease)   . History of blood transfusion 06/2013   "during hospitalization"  . Hyperlipidemia   . Iliotibial band syndrome of left side 06/17/2015  . 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 Left knee pain 10/01/2014  . Migraines 1943 to present   "fairly frequently"; Takes Propranolol (01/12/2015)  . 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  . Overactive bladder    Takes Vesicare  . Primary osteoarthritis of left knee 12/03/2014  . 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    Past Surgical History:  Procedure Laterality Date  . BREAST BIOPSY Left ~ 1973  .  CATARACT EXTRACTION W/ INTRAOCULAR LENS  IMPLANT, BILATERAL Bilateral 2003  . CHEST TUBE INSERTION  January 2015   X 2 at [facility]  . DILATION AND CURETTAGE OF UTERUS  4-5  . JOINT REPLACEMENT    . REPLACEMENT TOTAL KNEE Left   . TONSILLECTOMY AND ADENOIDECTOMY  1946; 1947  . TOTAL KNEE ARTHROPLASTY Left 01/11/2015  . TOTAL KNEE ARTHROPLASTY Left 01/11/2015   Procedure: Left TOTAL KNEE ARTHROPLASTY;  Surgeon: Dannielle Huh, MD;  Location: [facility] OR;  Service: Orthopedics;  Laterality: Left;  Marland Kitchen VIDEO ASSISTED THORACOSCOPY (VATS)/THOROCOTOMY Left 07/13/2013   VATS with insertion of chest tubes    History reviewed. No pertinent family history.  Social History   Socioeconomic History  . Marital status: Widowed    Spouse name: Not on file  . Number of children: Not on file  . Years of education: Not on file  . Highest education level: Not on file  Occupational History  . Not on file  Tobacco Use  . Smoking status: Never Smoker  . Smokeless tobacco: Never Used  Substance and Sexual Activity  . Alcohol use: Yes    Alcohol/week: 1.0 standard drinks    Types: 1 Glasses of wine per week  . Drug use: No  . Sexual activity: Never  Other Topics Concern  . Not on file  Social History Narrative  . Not on file   Social Determinants of Health   Financial Resource Strain:   . Difficulty of Paying Living Expenses:   Food Insecurity:   . Worried About Programme researcher, broadcasting/film/video in the Last  Year:   . Ran Out of Food in the Last Year:   Transportation Needs:   . Freight forwarder (Medical):   Marland Kitchen Lack of Transportation (Non-Medical):   Physical Activity:   . Days of Exercise per Week:   . Minutes of Exercise per Session:   Stress:   . Feeling of Stress :   Social Connections:   . Frequency of Communication with Friends and Family:   . Frequency of Social Gatherings with Friends and Family:   . Attends Religious Services:   . Active Member of Clubs or Organizations:   . Attends Occupational hygienist Meetings:   Marland Kitchen Marital Status:   Intimate Partner Violence:   . Fear of Current or Ex-Partner:   . Emotionally Abused:   Marland Kitchen Physically Abused:   . Sexually Abused:     Outpatient Medications Prior to Visit  Medication Sig Dispense Refill  . albuterol (PROVENTIL HFA;VENTOLIN HFA) 108 (90 Base) MCG/ACT inhaler Inhale two puffs every four to six hours as needed for cough or wheeze. 1 Inhaler 1  . Ascorbic Acid (VITAMIN C PO) Take 1 tablet by mouth daily.     . Ascorbic Acid (VITAMIN C) 1000 MG tablet Take 1,000 mg by mouth daily.    Marland Kitchen azithromycin (ZITHROMAX) 250 MG tablet 2 tablets on day 1, then 1 tablet daily on days 2-6.2 tablets on day 1, then 1 tablet daily on days 2-6. 6 tablet 0  . Ca & Phos-Vit D-Mag (CALCIUM) [phone] TABS Take by mouth.    . Calcium Carb-Cholecalciferol (CALCIUM + D3) 600-200 MG-UNIT TABS Take 1 tablet by mouth 2 (two) times daily.    . celecoxib (CELEBREX) 100 MG capsule Take 1 capsule (100 mg total) by mouth 2 (two) times daily. 60 capsule 3  . Cholecalciferol (VITAMIN D3) 250 MCG (10000 UT) TABS Take by mouth.    . estradiol (ESTRACE) 0.1 MG/GM vaginal cream Place 1 Applicatorful vaginally at bedtime.    . famotidine (PEPCID) 40 MG tablet Take 1 tablet (40 mg total) by mouth 2 (two) times daily. 180 tablet 1  . fluticasone (FLONASE) 50 MCG/ACT nasal spray Can use one spray in each nostril once daily as directed. 16 g 0  . Fluticasone Furoate (ARNUITY ELLIPTA) 100 MCG/ACT AEPB Inhale 1 Dose into the lungs daily. 30 each 3  . HYDROcodone-acetaminophen (NORCO/VICODIN) 5-325 MG tablet every 8 (eight) hours as needed.   0  . Hypromellose (ARTIFICIAL TEARS OP) Apply 1 drop to eye as needed.    . loratadine (CLARITIN) 10 MG tablet Take 10 mg by mouth daily.    . Multiple Vitamins-Minerals (MULTIVITAMIN PO) Take 1 tablet by mouth daily.    . mupirocin ointment (BACTROBAN) 2 % APP SML AMT EXT AA TID    . simvastatin (ZOCOR) 40 MG tablet Take 1 tablet  (40 mg total) by mouth daily. 90 tablet 2  . SYMBICORT 80-4.5 MCG/ACT inhaler     . tamsulosin (FLOMAX) 0.4 MG CAPS capsule Take 0.4 mg by mouth daily.    Marland Kitchen VITAMIN E PO Take 1 capsule by mouth as needed.     No facility-administered medications prior to visit.    Allergies  Allergen Reactions  . Tetanus Toxoids Swelling and Other (See Comments)    Swelling around throat and jaws  . Bee Venom Swelling and Other (See Comments)    Crusty area on skin  . Hornet Venom Swelling    Other reaction(s): Other (See Comments) Crusty area on skin  .  Dilaudid [Hydromorphone] Nausea And Vomiting    Causes nausea and vomiting  . Erythromycin Nausea And Vomiting  . Fiorinal [Butalbital-Aspirin-Caffeine]     Altered mental status  . Amoxicillin Other (See Comments)    Upset stomach  . Doxycycline Other (See Comments)    Upset stomach  . Latex Other (See Comments)    Skin will crack open and bleed  . Neomycin Other (See Comments)    Irritates skin  . Penicillins Itching, Swelling and Rash    ROS Review of Systems  Constitutional: Negative.   HENT: Negative.   Eyes: Negative.   Respiratory: Negative.   Cardiovascular: Negative.   Gastrointestinal: Negative.   Genitourinary: Negative.   Musculoskeletal: Positive for arthralgias.  Skin: Negative.   Neurological: Negative.   Psychiatric/Behavioral: Negative.       Objective:    Physical Exam  Constitutional: She is oriented to person, place, and time. She appears well-developed and well-nourished.  HENT:  Head: Normocephalic and atraumatic.  Right Ear: External ear normal.  Left Ear: External ear normal.  Mouth/Throat: Oropharynx is clear and moist.  glasses  Eyes: Pupils are equal, round, and reactive to light. Conjunctivae are normal.  Cardiovascular: Normal rate, regular rhythm, normal heart sounds and intact distal pulses.  Pulmonary/Chest: Effort normal and breath sounds normal.  Abdominal: Soft. Bowel sounds are normal.    Musculoskeletal:        General: Normal range of motion.     Cervical back: Normal range of motion and neck supple.     Comments: Large bunion right foot with 2nd toe overlapping- needs correction  Neurological: She is alert and oriented to person, place, and time.  Skin: Skin is warm and dry.  Psychiatric: She has a normal mood and affect. Her behavior is normal. Judgment and thought content normal.  Vitals reviewed.   BP 118/64   Pulse 70   Temp (!) 97.2 F (36.2 C)   Ht 5' (1.524 m)   Wt 140 lb 12.8 oz (63.9 kg)   SpO2 98%   BMI 27.50 kg/m  Wt Readings from Last 3 Encounters:  11/26/19 140 lb 12.8 oz (63.9 kg)  09/10/19 145 lb 12.8 oz (66.1 kg)  08/27/19 148 lb (67.1 kg)     Health Maintenance Due  Topic Date Due  . TETANUS/TDAP  Never done  . DEXA SCAN  Never done  . MAMMOGRAM  06/26/2019    There are no preventive care reminders to display for this patient.  No results found for: TSH Lab Results  Component Value Date   WBC 6.7 07/29/2019   HGB 12.6 07/29/2019   HCT 36.6 07/29/2019   MCV 89 07/29/2019   PLT 289 07/29/2019   Lab Results  Component Value Date   NA 134 (L) 01/12/2015   K 3.5 01/12/2015   CO2 26 01/12/2015   GLUCOSE 125 (H) 01/12/2015   BUN 13 01/12/2015   CREATININE 0.73 01/12/2015   BILITOT 0.1 (L) 01/01/2015   ALKPHOS 91 01/01/2015   AST 26 01/01/2015   ALT 22 01/01/2015   PROT 7.5 01/01/2015   ALBUMIN 4.2 01/01/2015   CALCIUM 8.5 (L) 01/12/2015   ANIONGAP 7 01/12/2015   Lab Results  Component Value Date   CHOL 150 07/29/2019   Lab Results  Component Value Date   HDL 71 07/29/2019   Lab Results  Component Value Date   LDLCALC 64 07/29/2019   Lab Results  Component Value Date   TRIG 79 07/29/2019  Lab Results  Component Value Date   CHOLHDL 2.1 07/29/2019   No results found for: HGBA1C    Assessment & Plan:   Hypertension: An individual hypertension care plan was established and reinforced today.  The  patient's status was assessed using clinical findings on exam and labs or diagnostic tests. The patient's success at meeting treatment goals on disease specific evidence-based guidelines and found to be well controlled. SELF MANAGEMENT: The patient and I together assessed ways to personally work towards obtaining the recommended goals. RECOMMENDATIONS: avoid decongestants found in common cold remedies, decrease consumption of alcohol, perform routine monitoring of BP with home BP cuff, exercise, reduction of dietary salt, take medicines as prescribed, try not to miss doses and quit smoking.  Regular exercise and maintaining a healthy weight is needed.  Stress reduction may help. A CLINICAL SUMMARY including written plan identify barriers to care unique to individual due to social or financial issues.  We attempt to mutually creat solutions for individual and family understanding.  Migraines: AN INDIVIDUAL CARE PLAN for migraine headaches was established and reinforced today.  The patient's status was assessed using clinical findings on exam, labs, and other diagnostic testing. Patient's success at meeting treatment goals based on disease specific evidence-bassed guidelines and found to be in good control. RECOMMENDATIONS include maintaining present medicines and treatment.  Moderate persistent asthma: This patient has asthma milalbuterold and is on albuterol.  Patient is not having a flair.  Chronic medicines include albuterol, flutacasone. Addition new medicines none.  Asthma action plan is in place.   GERD: Plan of care was formulated today.  She is doing well.  A plan of care was formulated using patient exam, tests and other sources to optimize care using evidence based information.  Recommend no smoking, no eating after supper, avoid fatty foods, elevate Head of bed, avoid tight fitting clothing.  Continue on pepcid.  Hyperlipidemia: AN INDIVIDUAL CARE PLAN for hyperlipidemia/ cholesterol was  established and reinforced today.  The patient's status was assessed using clinical findings on exam, lab and other diagnostic tests. The patient's disease status was assessed based on evidence-based guidelines and found to be well controlled. MEDICATIONS were reviewed. SELF MANAGEMENT GOALS have been discussed and patient's success at attaining the goal of low cholesterol was assessed. RECOMMENDATION given include regular exercise 3 days a week and low cholesterol/low fat diet. CLINICAL SUMMARY including written plan to identify barriers unique to the patient due to social or economic  reasons was discussed.  BMI 27:  No changes in diet or activity, she lost weight last year.  We will watch closely.  No orders of the defined types were placed in this encounter. Return in about 4 months (around 03/27/2020) for fasting.      Brent Bulla, MD

## 2019-11-27 LAB — COMPREHENSIVE METABOLIC PANEL
ALT: 13 IU/L (ref 0–32)
AST: 20 IU/L (ref 0–40)
Albumin/Globulin Ratio: 2.1 (ref 1.2–2.2)
Albumin: 4.4 g/dL (ref 3.7–4.7)
Alkaline Phosphatase: 82 IU/L (ref 48–121)
BUN/Creatinine Ratio: 20 (ref 12–28)
BUN: 24 mg/dL (ref 8–27)
Bilirubin Total: 0.3 mg/dL (ref 0.0–1.2)
CO2: 23 mmol/L (ref 20–29)
Calcium: 10 mg/dL (ref 8.7–10.3)
Chloride: 102 mmol/L (ref 96–106)
Creatinine, Ser: 1.18 mg/dL — ABNORMAL HIGH (ref 0.57–1.00)
GFR calc Af Amer: 51 mL/min/{1.73_m2} — ABNORMAL LOW (ref >59)
GFR calc non Af Amer: 44 mL/min/{1.73_m2} — ABNORMAL LOW (ref >59)
Globulin, Total: 2.1 g/dL (ref 1.5–4.5)
Glucose: 102 mg/dL — ABNORMAL HIGH (ref 65–99)
Potassium: 4.2 mmol/L (ref 3.5–5.2)
Sodium: 139 mmol/L (ref 134–144)
Total Protein: 6.5 g/dL (ref 6.0–8.5)

## 2019-11-27 LAB — CBC WITH DIFFERENTIAL/PLATELET
Basophils Absolute: 0.1 10*3/uL (ref 0.0–0.2)
Basos: 1 %
EOS (ABSOLUTE): 0.4 10*3/uL (ref 0.0–0.4)
Eos: 5 %
Hematocrit: 36.6 % (ref 34.0–46.6)
Hemoglobin: 12.5 g/dL (ref 11.1–15.9)
Immature Grans (Abs): 0 10*3/uL (ref 0.0–0.1)
Immature Granulocytes: 0 %
Lymphocytes Absolute: 2.4 10*3/uL (ref 0.7–3.1)
Lymphs: 34 %
MCH: 31.3 pg (ref 26.6–33.0)
MCHC: 34.2 g/dL (ref 31.5–35.7)
MCV: 92 fL (ref 79–97)
Monocytes Absolute: 0.6 10*3/uL (ref 0.1–0.9)
Monocytes: 8 %
Neutrophils Absolute: 3.6 10*3/uL (ref 1.4–7.0)
Neutrophils: 52 %
Platelets: 295 10*3/uL (ref 150–450)
RBC: 4 x10E6/uL (ref 3.77–5.28)
RDW: 12.8 % (ref 11.7–15.4)
WBC: 7 10*3/uL (ref 3.4–10.8)

## 2019-11-27 LAB — LIPID PANEL
Chol/HDL Ratio: 2.3 ratio (ref 0.0–4.4)
Cholesterol, Total: 168 mg/dL (ref 100–199)
HDL: 72 mg/dL (ref >39)
LDL Chol Calc (NIH): 80 mg/dL (ref 0–99)
Triglycerides: 90 mg/dL (ref 0–149)
VLDL Cholesterol Cal: 16 mg/dL (ref 5–40)

## 2019-11-27 LAB — CARDIOVASCULAR RISK ASSESSMENT

## 2019-11-27 NOTE — Progress Notes (Signed)
CBC normal, glucose 102, kidney tests stable, liver tests normal, Cholesterol normal,  lp

## 2019-12-04 ENCOUNTER — Other Ambulatory Visit: Payer: Self-pay | Admitting: Legal Medicine

## 2019-12-04 DIAGNOSIS — M159 Polyosteoarthritis, unspecified: Secondary | ICD-10-CM

## 2020-01-19 DIAGNOSIS — R339 Retention of urine, unspecified: Secondary | ICD-10-CM | POA: Diagnosis not present

## 2020-01-19 DIAGNOSIS — N952 Postmenopausal atrophic vaginitis: Secondary | ICD-10-CM | POA: Diagnosis not present

## 2020-03-09 ENCOUNTER — Encounter: Payer: Self-pay | Admitting: Legal Medicine

## 2020-03-09 ENCOUNTER — Other Ambulatory Visit: Payer: Self-pay

## 2020-03-09 ENCOUNTER — Ambulatory Visit (INDEPENDENT_AMBULATORY_CARE_PROVIDER_SITE_OTHER): Payer: Medicare Other | Admitting: Legal Medicine

## 2020-03-09 VITALS — BP 122/80 | HR 62 | Temp 97.5°F | Resp 16 | Ht 60.0 in | Wt 137.6 lb

## 2020-03-09 DIAGNOSIS — K219 Gastro-esophageal reflux disease without esophagitis: Secondary | ICD-10-CM

## 2020-03-09 DIAGNOSIS — I10 Essential (primary) hypertension: Secondary | ICD-10-CM | POA: Diagnosis not present

## 2020-03-09 DIAGNOSIS — J4540 Moderate persistent asthma, uncomplicated: Secondary | ICD-10-CM | POA: Diagnosis not present

## 2020-03-09 DIAGNOSIS — E782 Mixed hyperlipidemia: Secondary | ICD-10-CM

## 2020-03-09 DIAGNOSIS — M159 Polyosteoarthritis, unspecified: Secondary | ICD-10-CM

## 2020-03-09 DIAGNOSIS — K529 Noninfective gastroenteritis and colitis, unspecified: Secondary | ICD-10-CM

## 2020-03-09 DIAGNOSIS — R197 Diarrhea, unspecified: Secondary | ICD-10-CM

## 2020-03-09 HISTORY — DX: Diarrhea, unspecified: R19.7

## 2020-03-09 HISTORY — DX: Noninfective gastroenteritis and colitis, unspecified: K52.9

## 2020-03-09 NOTE — Progress Notes (Signed)
Subjective:  Patient ID: Shannon Martin, female    DOB: 07-31-1940  Age: 79 y.o. MRN: [id number]  Chief Complaint  Patient presents with  . Hypertension  . Hyperlipidemia  . Gastroesophageal Reflux    HPI: chronic visit  Patient presents for follow up of hypertension.  Patient tolerating diet and exercise well with side effects.  Patient was diagnosed with hypertension 2010 so has been treated for hypertension for 10 years.Patient is working on maintaining diet and exercise regimen and follows up as directed. Complication include none.  Patient presents with hyperlipidemia.  Compliance with treatment has been good; patient takes medicines as directed, maintains low cholesterol diet, follows up as directed, and maintains exercise regimen.  Patient is using simvastatin without problems.  Patient has gastroesophageal reflux symptoms withesophagitis and LTRD.  The symptoms are moderate intensity.  Length of symptoms 10 years years.  Medicines include pepcid.  Complications include none  Recurrent diarrhea 2 times a week weight loss 40 lbs over 1 year . When she gets this she stays in house and unable to travel, at least 6 bm that day and immodium not helping. She has tried pepto without help.  She has asthma, symbicort, albuterol,  Current Outpatient Medications on File Prior to Visit  Medication Sig Dispense Refill  . albuterol (PROVENTIL HFA;VENTOLIN HFA) 108 (90 Base) MCG/ACT inhaler Inhale two puffs every four to six hours as needed for cough or wheeze. 1 Inhaler 1  . Ascorbic Acid (VITAMIN C) 1000 MG tablet Take 1,000 mg by mouth daily.    . Ca & Phos-Vit D-Mag (CALCIUM) [phone] TABS Take by mouth.    . Calcium Carb-Cholecalciferol (CALCIUM + D3) 600-200 MG-UNIT TABS Take 1 tablet by mouth 2 (two) times daily.    . celecoxib (CELEBREX) 100 MG capsule Take 1 capsule (100 mg total) by mouth 2 (two) times daily. 180 capsule 2  . Cholecalciferol (VITAMIN D3) 250 MCG (10000 UT) TABS  Take by mouth.    . estradiol (ESTRACE) 0.1 MG/GM vaginal cream Place 1 Applicatorful vaginally at bedtime.    . famotidine (PEPCID) 40 MG tablet Take 1 tablet (40 mg total) by mouth 2 (two) times daily. 180 tablet 1  . fluticasone (FLONASE) 50 MCG/ACT nasal spray Can use one spray in each nostril once daily as directed. 16 g 0  . HYDROcodone-acetaminophen (NORCO/VICODIN) 5-325 MG tablet every 8 (eight) hours as needed.   0  . Hypromellose (ARTIFICIAL TEARS OP) Apply 1 drop to eye as needed.    . loratadine (CLARITIN) 10 MG tablet Take 10 mg by mouth daily.    . Multiple Vitamins-Minerals (MULTIVITAMIN PO) Take 1 tablet by mouth daily.    . mupirocin ointment (BACTROBAN) 2 % APP SML AMT EXT AA TID    . simvastatin (ZOCOR) 40 MG tablet Take 1 tablet (40 mg total) by mouth daily. 90 tablet 2  . SYMBICORT 80-4.5 MCG/ACT inhaler     . tamsulosin (FLOMAX) 0.4 MG CAPS capsule Take 0.4 mg by mouth daily.    Marland Kitchen VITAMIN E PO Take 1 capsule by mouth as needed.     No current facility-administered medications on file prior to visit.   Past Medical History:  Diagnosis Date  . Allergic rhinitis 02/16/2015  . GERD (gastroesophageal reflux disease)   . Iliotibial band syndrome of left side 06/17/2015  . 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 Left knee pain 10/01/2014  . Migraines 1943 to present   "fairly frequently";  Takes Propranolol (01/12/2015)  . 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  . Overactive bladder    Takes Vesicare  . Primary osteoarthritis of left knee 12/03/2014  . 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 BREAST BIOPSY Left ~ 1973  . CATARACT EXTRACTION W/ INTRAOCULAR LENS  IMPLANT, BILATERAL Bilateral 2003  . CHEST TUBE INSERTION  January 2015   [facility]  . DILATION AND CURETTAGE OF UTERUS  4-5  . JOINT REPLACEMENT    . REPLACEMENT TOTAL KNEE Left   . TONSILLECTOMY AND  ADENOIDECTOMY  1946; 1947  . TOTAL KNEE ARTHROPLASTY Left 01/11/2015  . TOTAL KNEE ARTHROPLASTY Left 01/11/2015   Procedure: Left TOTAL KNEE ARTHROPLASTY;  Surgeon: Dannielle HuhSteve Lucey, MD;  Location: [facility] OR;  Service: Orthopedics;  Laterality: Left;  Marland Kitchen. VIDEO ASSISTED THORACOSCOPY (VATS)/THOROCOTOMY Left 07/13/2013   VATS with insertion of chest tubes    History reviewed. No pertinent family history. Social History   Socioeconomic History  . Marital status: Widowed    Spouse name: Not on file  . Number of children: Not on file  . Years of education: Not on file  . Highest education level: Not on file  Occupational History  . Not on file  Tobacco Use  . Smoking status: Never Smoker  . Smokeless tobacco: Never Used  Vaping Use  . Vaping Use: Never used  Substance and Sexual Activity  . Alcohol use: Yes    Alcohol/week: 1.0 standard drink    Types: 1 Glasses of wine per week  . Drug use: No  . Sexual activity: Never  Other Topics Concern  . Not on file  Social History Narrative  . Not on file   Social Determinants of Health   Financial Resource Strain:   . Difficulty of Paying Living Expenses: Not on file  Food Insecurity:   . Worried About Programme researcher, broadcasting/film/videounning Out of Food in the Last Year: Not on file  . Ran Out of Food in the Last Year: Not on file  Transportation Needs:   . Lack of Transportation (Medical): Not on file  . Lack of Transportation (Non-Medical): Not on file  Physical Activity:   . Days of Exercise per Week: Not on file  . Minutes of Exercise per Session: Not on file  Stress:   . Feeling of Stress : Not on file  Social Connections:   . Frequency of Communication with Friends and Family: Not on file  . Frequency of Social Gatherings with Friends and Family: Not on file  . Attends Religious Services: Not on file  . Active Member of Clubs or Organizations: Not on file  . Attends BankerClub or Organization Meetings: Not on file  . Marital Status: Not on file    Review of Systems    Constitutional: Negative.   HENT: Negative.   Eyes: Negative.   Respiratory: Negative.   Cardiovascular: Negative for chest pain, palpitations and leg swelling.  Gastrointestinal: Positive for abdominal pain and diarrhea.  Genitourinary: Negative.   Musculoskeletal: Positive for arthralgias.  Skin: Negative.   Neurological: Negative.   Psychiatric/Behavioral: Negative.      Objective:  BP 122/80   Pulse 62   Temp (!) 97.5 F (36.4 C)   Resp 16   Ht 5' (1.524 m)   Wt 137 lb 9.6 oz (62.4 kg)   SpO2 99%   BMI 26.87 kg/m   BP/Weight 03/09/2020 11/26/2019 09/10/2019  Systolic BP 122 118 118  Diastolic  BP 80 64 72  Wt. (Lbs) 137.6 140.8 145.8  BMI 26.87 27.5 29.2    Physical Exam Vitals reviewed.  Constitutional:      Appearance: Normal appearance.  HENT:     Head: Normocephalic and atraumatic.     Right Ear: Tympanic membrane, ear canal and external ear normal.     Left Ear: Tympanic membrane, ear canal and external ear normal.     Nose: Nose normal.     Mouth/Throat:     Mouth: Mucous membranes are moist.     Pharynx: Oropharynx is clear.  Eyes:     Extraocular Movements: Extraocular movements intact.     Conjunctiva/sclera: Conjunctivae normal.     Pupils: Pupils are equal, round, and reactive to light.  Cardiovascular:     Rate and Rhythm: Normal rate and regular rhythm.     Pulses: Normal pulses.     Heart sounds: Normal heart sounds.  Pulmonary:     Effort: Pulmonary effort is normal.     Breath sounds: Normal breath sounds.  Abdominal:     General: Abdomen is flat. Bowel sounds are normal.     Palpations: Abdomen is soft.  Musculoskeletal:        General: Normal range of motion.     Cervical back: Normal range of motion and neck supple.  Skin:    General: Skin is warm and dry.     Capillary Refill: Capillary refill takes less than 2 seconds.  Neurological:     General: No focal deficit present.     Mental Status: She is alert.  Psychiatric:         Mood and Affect: Mood normal.        Thought Content: Thought content normal.        Judgment: Judgment normal.       Lab Results  Component Value Date   WBC 7.0 11/26/2019   HGB 12.5 11/26/2019   HCT 36.6 11/26/2019   PLT 295 11/26/2019   GLUCOSE 102 (H) 11/26/2019   CHOL 168 11/26/2019   TRIG 90 11/26/2019   HDL 72 11/26/2019   LDLCALC 80 11/26/2019   ALT 13 11/26/2019   AST 20 11/26/2019   NA 139 11/26/2019   K 4.2 11/26/2019   CL 102 11/26/2019   CREATININE 1.18 (H) 11/26/2019   BUN 24 11/26/2019   CO2 23 11/26/2019   INR 1.02 01/01/2015      Assessment & Plan:   1. Mixed hyperlipidemia - Lipid panel AN INDIVIDUAL CARE PLAN for hyperlipidemia/ cholesterol was established and reinforced today.  The patient's status was assessed using clinical findings on exam, lab and other diagnostic tests. The patient's disease status was assessed based on evidence-based guidelines and found to be well controlled. MEDICATIONS were reviewed. SELF MANAGEMENT GOALS have been discussed and patient's success at attaining the goal of low cholesterol was assessed. RECOMMENDATION given include regular exercise 3 days a week and low cholesterol/low fat diet. CLINICAL SUMMARY including written plan to identify barriers unique to the patient due to social or economic  reasons was discussed.  2. Essential hypertension - CBC with Differential/Platelet - Comprehensive metabolic panel An individual hypertension care plan was established and reinforced today.  The patient's status was assessed using clinical findings on exam and labs or diagnostic tests. The patient's success at meeting treatment goals on disease specific evidence-based guidelines and found to be well controlled. SELF MANAGEMENT: The patient and I together assessed ways to personally work towards obtaining  the recommended goals. RECOMMENDATIONS: avoid decongestants found in common cold remedies, decrease consumption of alcohol,  perform routine monitoring of BP with home BP cuff, exercise, reduction of dietary salt, take medicines as prescribed, try not to miss doses and quit smoking.  Regular exercise and maintaining a healthy weight is needed.  Stress reduction may help. A CLINICAL SUMMARY including written plan identify barriers to care unique to individual due to social or financial issues.  We attempt to mutually creat solutions for individual and family understanding.  3. Moderate persistent asthma without complication This patient has asthma moderate and is on symbicort and albuterol.  Patient is not having a flair.  Chronic medicines include symbicort. Addition new medicines none.  Asthma action plan is in place.   4. Gastroesophageal reflux disease without esophagitis Plan of care was formulated today.  She is doing well.  A plan of care was formulated using patient exam, tests and other sources to optimize care using evidence based information.  Recommend no smoking, no eating after supper, avoid fatty foods, elevate Head of bed, avoid tight fitting clothing.  Continue on pepcid.  5. Generalized osteoarthritis AN INDIVIDUAL CARE PLAN was established and reinforced today.  The patient's status was assessed using clinical findings on exam, labs, and other diagnostic testing. Patient's success at meeting treatment goals based on disease specific evidence-bassed guidelines and found to be in fair control. RECOMMENDATIONS include maintain present medicines and treatment.  6. Chronic diarrhea - Ambulatory referral to Gastroenterology Patient having intermittant disrrhea lasting few days associated with mucous and some blood, not stopped by immodium      Orders Placed This Encounter  Procedures  . CBC with Differential/Platelet  . Comprehensive metabolic panel  . Lipid panel  . Ambulatory referral to Gastroenterology     Follow-up: Return in about 6 months (around 09/06/2020) for fasting.  An After Visit  Summary was printed and given to the patient.  [facility] Bulla [facility] [phone]

## 2020-03-10 ENCOUNTER — Other Ambulatory Visit: Payer: Self-pay

## 2020-03-10 ENCOUNTER — Telehealth: Payer: Self-pay

## 2020-03-10 DIAGNOSIS — K529 Noninfective gastroenteritis and colitis, unspecified: Secondary | ICD-10-CM

## 2020-03-10 LAB — COMPREHENSIVE METABOLIC PANEL
ALT: 14 IU/L (ref 0–32)
AST: 22 IU/L (ref 0–40)
Albumin/Globulin Ratio: 2.2 (ref 1.2–2.2)
Albumin: 4.7 g/dL (ref 3.7–4.7)
Alkaline Phosphatase: 90 IU/L (ref 44–121)
BUN/Creatinine Ratio: 25 (ref 12–28)
BUN: 26 mg/dL (ref 8–27)
Bilirubin Total: 0.2 mg/dL (ref 0.0–1.2)
CO2: 23 mmol/L (ref 20–29)
Calcium: 10.5 mg/dL — ABNORMAL HIGH (ref 8.7–10.3)
Chloride: 104 mmol/L (ref 96–106)
Creatinine, Ser: 1.04 mg/dL — ABNORMAL HIGH (ref 0.57–1.00)
GFR calc Af Amer: 59 mL/min/{1.73_m2} — ABNORMAL LOW (ref >59)
GFR calc non Af Amer: 52 mL/min/{1.73_m2} — ABNORMAL LOW (ref >59)
Globulin, Total: 2.1 g/dL (ref 1.5–4.5)
Glucose: 100 mg/dL — ABNORMAL HIGH (ref 65–99)
Potassium: 4.9 mmol/L (ref 3.5–5.2)
Sodium: 144 mmol/L (ref 134–144)
Total Protein: 6.8 g/dL (ref 6.0–8.5)

## 2020-03-10 LAB — LIPID PANEL
Chol/HDL Ratio: 2.6 ratio (ref 0.0–4.4)
Cholesterol, Total: 166 mg/dL (ref 100–199)
HDL: 64 mg/dL (ref >39)
LDL Chol Calc (NIH): 79 mg/dL (ref 0–99)
Triglycerides: 130 mg/dL (ref 0–149)
VLDL Cholesterol Cal: 23 mg/dL (ref 5–40)

## 2020-03-10 LAB — CBC WITH DIFFERENTIAL/PLATELET
Basophils Absolute: 0.1 10*3/uL (ref 0.0–0.2)
Basos: 1 %
EOS (ABSOLUTE): 0.3 10*3/uL (ref 0.0–0.4)
Eos: 4 %
Hematocrit: 39.5 % (ref 34.0–46.6)
Hemoglobin: 12.8 g/dL (ref 11.1–15.9)
Immature Grans (Abs): 0 10*3/uL (ref 0.0–0.1)
Immature Granulocytes: 0 %
Lymphocytes Absolute: 2.5 10*3/uL (ref 0.7–3.1)
Lymphs: 35 %
MCH: 29.7 pg (ref 26.6–33.0)
MCHC: 32.4 g/dL (ref 31.5–35.7)
MCV: 92 fL (ref 79–97)
Monocytes Absolute: 0.5 10*3/uL (ref 0.1–0.9)
Monocytes: 7 %
Neutrophils Absolute: 3.8 10*3/uL (ref 1.4–7.0)
Neutrophils: 53 %
Platelets: 385 10*3/uL (ref 150–450)
RBC: 4.31 x10E6/uL (ref 3.77–5.28)
RDW: 12.6 % (ref 11.7–15.4)
WBC: 7.2 10*3/uL (ref 3.4–10.8)

## 2020-03-10 LAB — CARDIOVASCULAR RISK ASSESSMENT

## 2020-03-10 NOTE — Progress Notes (Signed)
CBC normal, glucose 100, kidney tests stable, calcium high, slightly up from last time, liver tests normal, Cholesterol normal,  lp

## 2020-03-10 NOTE — Telephone Encounter (Signed)
Patient requested another provider than Dr Charm Barges for the referral. She is agreed to go to [facility] or [place].

## 2020-03-10 NOTE — Telephone Encounter (Signed)
Refer to [facility]

## 2020-03-10 NOTE — Telephone Encounter (Signed)
I put the order in

## 2020-03-26 ENCOUNTER — Ambulatory Visit: Payer: Medicare Other | Admitting: Legal Medicine

## 2020-03-30 DIAGNOSIS — R339 Retention of urine, unspecified: Secondary | ICD-10-CM | POA: Diagnosis not present

## 2020-03-30 DIAGNOSIS — N3281 Overactive bladder: Secondary | ICD-10-CM | POA: Diagnosis not present

## 2020-04-08 ENCOUNTER — Other Ambulatory Visit: Payer: Self-pay | Admitting: Legal Medicine

## 2020-04-08 DIAGNOSIS — E782 Mixed hyperlipidemia: Secondary | ICD-10-CM

## 2020-05-06 ENCOUNTER — Encounter: Payer: Self-pay | Admitting: Legal Medicine

## 2020-05-07 ENCOUNTER — Other Ambulatory Visit: Payer: Self-pay

## 2020-05-07 ENCOUNTER — Ambulatory Visit (INDEPENDENT_AMBULATORY_CARE_PROVIDER_SITE_OTHER): Payer: Medicare Other

## 2020-05-07 DIAGNOSIS — Z23 Encounter for immunization: Secondary | ICD-10-CM | POA: Diagnosis not present

## 2020-05-11 DIAGNOSIS — R339 Retention of urine, unspecified: Secondary | ICD-10-CM | POA: Diagnosis not present

## 2020-05-11 DIAGNOSIS — N3281 Overactive bladder: Secondary | ICD-10-CM | POA: Diagnosis not present

## 2020-06-04 ENCOUNTER — Telehealth (INDEPENDENT_AMBULATORY_CARE_PROVIDER_SITE_OTHER): Payer: Medicare Other | Admitting: Nurse Practitioner

## 2020-06-04 VITALS — Ht 60.0 in | Wt 140.0 lb

## 2020-06-04 DIAGNOSIS — J0191 Acute recurrent sinusitis, unspecified: Secondary | ICD-10-CM

## 2020-06-04 DIAGNOSIS — J3089 Other allergic rhinitis: Secondary | ICD-10-CM | POA: Diagnosis not present

## 2020-06-04 DIAGNOSIS — I10 Essential (primary) hypertension: Secondary | ICD-10-CM | POA: Diagnosis not present

## 2020-06-04 DIAGNOSIS — J4540 Moderate persistent asthma, uncomplicated: Secondary | ICD-10-CM | POA: Diagnosis not present

## 2020-06-04 MED ORDER — ONDANSETRON 4 MG PO TBDP: 4.0000 mg | ORAL_TABLET | Freq: Three times a day (TID) | ORAL | 0 refills | Status: DC | PRN

## 2020-06-04 MED ORDER — CEFDINIR 300 MG PO CAPS: 300.0000 mg | ORAL_CAPSULE | Freq: Two times a day (BID) | ORAL | 0 refills | Status: AC

## 2020-06-04 NOTE — Progress Notes (Signed)
Virtual Visit via Telephone Note   This visit type was conducted due to national recommendations for restrictions regarding the COVID-19 Pandemic (e.g. social distancing) in an effort to limit this patient's exposure and mitigate transmission in our community.  Due to her co-morbid illnesses, this patient is at least at moderate risk for complications without adequate follow up.  This format is felt to be most appropriate for this patient at this time.  The patient did not have access to video technology/had technical difficulties with video requiring transitioning to audio format only (telephone).  All issues noted in this document were discussed and addressed.  No physical exam could be performed with this format.  Patient verbally consented to a telehealth visit.   Date:  06/04/2020   ID:  Shannon Martin, DOB 1941-03-09, MRN [id number]  Patient Location: Home Provider Location: Office/Clinic  PCP:  Abigail Miyamoto, MD   Evaluation Performed:  Established patient, acute visit  Chief Complaint: Cough  History of Present Illness:    Shannon Martin is a 79 y.o. female with productive cough (thick clear to white mucus) and sinus congestion. Onset was 2 weeks ago. Treatments include OTC generic Mucinex and cold medication. She has continued daily prescribed medications, including Flonase, Claritin, and Symbicort with minimal relief of symptoms. She denies requiring rescue inhaler for dyspnea.  She has received COVID-19 vaccines and booster, and flu immunization. She denies any known ill contacts. She has a past medical history of asthma, environmental allergies, hypertension, hyperlipidemia, GERD, and OA.   The patient does have symptoms concerning for COVID-19 infection (fever, chills, cough, or new shortness of breath).    Past Medical History:  Diagnosis Date  . Allergic rhinitis 02/16/2015  . GERD (gastroesophageal reflux disease)   . Iliotibial band syndrome of left side  06/17/2015  . 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 Left knee pain 10/01/2014  . Migraines 1943 to present   "fairly frequently"; Takes Propranolol (01/12/2015)  . 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  . Overactive bladder    Takes Vesicare  . Primary osteoarthritis of left knee 12/03/2014  . 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    Past Surgical History:  Procedure Laterality Date  . BREAST BIOPSY Left ~ 1973  . CATARACT EXTRACTION W/ INTRAOCULAR LENS  IMPLANT, BILATERAL Bilateral 2003  . CHEST TUBE INSERTION  January 2015   X 2 at [facility]  . DILATION AND CURETTAGE OF UTERUS  4-5  . JOINT REPLACEMENT    . REPLACEMENT TOTAL KNEE Left   . TONSILLECTOMY AND ADENOIDECTOMY  1946; 1947  . TOTAL KNEE ARTHROPLASTY Left 01/11/2015  . TOTAL KNEE ARTHROPLASTY Left 01/11/2015   Procedure: Left TOTAL KNEE ARTHROPLASTY;  Surgeon: Dannielle Huh, MD;  Location: [facility] OR;  Service: Orthopedics;  Laterality: Left;  Marland Kitchen VIDEO ASSISTED THORACOSCOPY (VATS)/THOROCOTOMY Left 07/13/2013   VATS with insertion of chest tubes    No family history on file.  Social History   Socioeconomic History  . Marital status: Widowed    Spouse name: Not on file  . Number of children: Not on file  . Years of education: Not on file  . Highest education level: Not on file  Occupational History  . Not on file  Tobacco Use  . Smoking status: Never Smoker  . Smokeless tobacco: Never Used  Vaping Use  . Vaping Use: Never used  Substance and Sexual Activity  . Alcohol use: Yes    Alcohol/week: 1.0 standard drink  Types: 1 Glasses of wine per week  . Drug use: No  . Sexual activity: Never  Other Topics Concern  . Not on file  Social History Narrative  . Not on file   Social Determinants of Health   Financial Resource Strain: Not on file  Food Insecurity: Not on file  Transportation Needs: Not on file  Physical Activity: Not on file  Stress: Not on file  Social  Connections: Not on file  Intimate Partner Violence: Not on file    Outpatient Medications Prior to Visit  Medication Sig Dispense Refill  . albuterol (PROVENTIL HFA;VENTOLIN HFA) 108 (90 Base) MCG/ACT inhaler Inhale two puffs every four to six hours as needed for cough or wheeze. 1 Inhaler 1  . Ascorbic Acid (VITAMIN C) 1000 MG tablet Take 1,000 mg by mouth daily.    . Ca & Phos-Vit D-Mag (CALCIUM) [phone] TABS Take by mouth.    . Calcium Carb-Cholecalciferol (CALCIUM + D3) 600-200 MG-UNIT TABS Take 1 tablet by mouth 2 (two) times daily.    . celecoxib (CELEBREX) 100 MG capsule Take 1 capsule (100 mg total) by mouth 2 (two) times daily. 180 capsule 2  . Cholecalciferol (VITAMIN D3) 250 MCG (10000 UT) TABS Take by mouth.    . estradiol (ESTRACE) 0.1 MG/GM vaginal cream Place 1 Applicatorful vaginally at bedtime.    . famotidine (PEPCID) 40 MG tablet Take 1 tablet (40 mg total) by mouth 2 (two) times daily. 180 tablet 1  . fluticasone (FLONASE) 50 MCG/ACT nasal spray Can use one spray in each nostril once daily as directed. 16 g 0  . HYDROcodone-acetaminophen (NORCO/VICODIN) 5-325 MG tablet every 8 (eight) hours as needed.   0  . Hypromellose (ARTIFICIAL TEARS OP) Apply 1 drop to eye as needed.    . loratadine (CLARITIN) 10 MG tablet Take 10 mg by mouth daily.    . Multiple Vitamins-Minerals (MULTIVITAMIN PO) Take 1 tablet by mouth daily.    . mupirocin ointment (BACTROBAN) 2 % APP SML AMT EXT AA TID    . simvastatin (ZOCOR) 40 MG tablet TAKE 1 TABLET(40 MG) BY MOUTH DAILY 90 tablet 2  . SYMBICORT 80-4.5 MCG/ACT inhaler     . tamsulosin (FLOMAX) 0.4 MG CAPS capsule Take 0.4 mg by mouth daily.    Marland Kitchen VITAMIN E PO Take 1 capsule by mouth as needed.     No facility-administered medications prior to visit.    Allergies:   Tetanus toxoids, Bee venom, Hornet venom, Dilaudid [hydromorphone], Erythromycin, Fiorinal [butalbital-aspirin-caffeine], Amoxicillin, Doxycycline, Latex, Neomycin, and  Penicillins   Social History   Tobacco Use  . Smoking status: Never Smoker  . Smokeless tobacco: Never Used  Vaping Use  . Vaping Use: Never used  Substance Use Topics  . Alcohol use: Yes    Alcohol/week: 1.0 standard drink    Types: 1 Glasses of wine per week  . Drug use: No     Review of Systems  Constitutional: Negative for chills, fever and malaise/fatigue.  HENT: Positive for congestion. Negative for ear pain, sinus pain and sore throat.   Respiratory: Positive for cough and sputum production (milky colored). Negative for shortness of breath and wheezing.   Cardiovascular: Positive for chest pain (soreness related to frequent coughing). Negative for palpitations, orthopnea and leg swelling.  Musculoskeletal: Positive for joint pain (chronic, OA). Negative for myalgias and neck pain.  Skin: Negative for rash.  Neurological: Negative for headaches.  Endo/Heme/Allergies: Positive for environmental allergies.     Labs/Other  Tests and Data Reviewed:    Recent Labs: 03/09/2020: ALT 14; BUN 26; Creatinine, Ser 1.04; Hemoglobin 12.8; Platelets 385; Potassium 4.9; Sodium 144   Recent Lipid Panel Lab Results  Component Value Date/Time   CHOL 166 03/09/2020 11:15 AM   TRIG 130 03/09/2020 11:15 AM   HDL 64 03/09/2020 11:15 AM   CHOLHDL 2.6 03/09/2020 11:15 AM   LDLCALC 79 03/09/2020 11:15 AM    Wt Readings from Last 3 Encounters:  06/04/20 140 lb (63.5 kg)  03/09/20 137 lb 9.6 oz (62.4 kg)  11/26/19 140 lb 12.8 oz (63.9 kg)     Objective:    Vital Signs:  Ht 5' (1.524 m)   Wt 140 lb (63.5 kg)   BMI 27.34 kg/m    Physical Exam no physical exam performed due to telemed visit  ASSESSMENT & PLAN:    1. Acute recurrent sinusitis, unspecified location - cefdinir (OMNICEF) 300 MG capsule; Take 1 capsule (300 mg total) by mouth 2 (two) times daily for 5 days.  Dispense: 10 capsule; Refill: 0  - ondansetron (ZOFRAN ODT) 4 MG disintegrating tablet; Take 1 tablet (4 mg  total) by mouth every 8 (eight) hours as needed for nausea or vomiting.  Dispense: 20 tablet; Refill: 0  -Take antibiotic with Zofran to decrease nausea and vomiting  -Rest and push fluids  2. Moderate persistent asthma, unspecified whether complicated -Continue Symbicort daily -Notify office for SOB or wheezing  3. Non-seasonal allergic rhinitis, unspecified trigger -Continue Claritin and  Flonase  4. Essential hypertension -Monitor BP, notify office for any problems  COVID-19 Education: The signs and symptoms of COVID-19 were discussed with the patient and how to seek care for testing (follow up with PCP or arrange E-visit). The importance of social distancing was discussed today.  Telephone visit : 09:11- 09:29  Follow Up:  Virtual Visit  prn  Signed,  Flonnie Hailstone, DNP 06/04/2020 11:42    [facility] [place]

## 2020-06-05 ENCOUNTER — Encounter: Payer: Self-pay | Admitting: Nurse Practitioner

## 2020-07-08 ENCOUNTER — Other Ambulatory Visit: Payer: Self-pay | Admitting: Legal Medicine

## 2020-08-11 DIAGNOSIS — R339 Retention of urine, unspecified: Secondary | ICD-10-CM | POA: Diagnosis not present

## 2020-08-11 DIAGNOSIS — N3281 Overactive bladder: Secondary | ICD-10-CM | POA: Diagnosis not present

## 2020-08-21 ENCOUNTER — Other Ambulatory Visit: Payer: Self-pay | Admitting: Legal Medicine

## 2020-08-21 DIAGNOSIS — M159 Polyosteoarthritis, unspecified: Secondary | ICD-10-CM

## 2020-09-16 ENCOUNTER — Ambulatory Visit (INDEPENDENT_AMBULATORY_CARE_PROVIDER_SITE_OTHER): Payer: Medicare Other | Admitting: Legal Medicine

## 2020-09-16 ENCOUNTER — Encounter: Payer: Self-pay | Admitting: Legal Medicine

## 2020-09-16 ENCOUNTER — Other Ambulatory Visit: Payer: Self-pay

## 2020-09-16 VITALS — BP 130/74 | HR 84 | Temp 97.4°F | Resp 16 | Ht 60.0 in | Wt 147.2 lb

## 2020-09-16 DIAGNOSIS — E559 Vitamin D deficiency, unspecified: Secondary | ICD-10-CM

## 2020-09-16 DIAGNOSIS — J4540 Moderate persistent asthma, uncomplicated: Secondary | ICD-10-CM

## 2020-09-16 DIAGNOSIS — K219 Gastro-esophageal reflux disease without esophagitis: Secondary | ICD-10-CM

## 2020-09-16 DIAGNOSIS — Z780 Asymptomatic menopausal state: Secondary | ICD-10-CM

## 2020-09-16 DIAGNOSIS — E782 Mixed hyperlipidemia: Secondary | ICD-10-CM

## 2020-09-16 DIAGNOSIS — M159 Polyosteoarthritis, unspecified: Secondary | ICD-10-CM

## 2020-09-16 DIAGNOSIS — I10 Essential (primary) hypertension: Secondary | ICD-10-CM | POA: Diagnosis not present

## 2020-09-16 DIAGNOSIS — K529 Noninfective gastroenteritis and colitis, unspecified: Secondary | ICD-10-CM

## 2020-09-16 MED ORDER — CELECOXIB 100 MG PO CAPS: 100.0000 mg | ORAL_CAPSULE | Freq: Two times a day (BID) | ORAL | 2 refills | Status: DC

## 2020-09-16 NOTE — Progress Notes (Signed)
Subjective:  Patient ID: Shannon Martin, female    DOB: 01/28/1941  Age: 80 y.o. MRN: [id number]  Chief Complaint  Patient presents with  . Hypertension  . Hyperlipidemia    HPI: chronic visit  Patient presents for follow up of hypertension.  Patient tolerating diet well with side effects.  Patient was diagnosed with hypertension 10 so has been treated for hypertension for 10 years.Patient is working on maintaining diet and exercise regimen and follows up as directed. Complication include none.  Patient presents with hyperlipidemia.  Compliance with treatment has been good; patient takes medicines as directed, maintains low cholesterol diet, follows up as directed, and maintains exercise regimen.  Patient is using simvastatin without problems.  Diarrhea, not gotten colonoscopy and is now on gluten free diet.   Current Outpatient Medications on File Prior to Visit  Medication Sig Dispense Refill  . albuterol (PROVENTIL HFA;VENTOLIN HFA) 108 (90 Base) MCG/ACT inhaler Inhale two puffs every four to six hours as needed for cough or wheeze. 1 Inhaler 1  . Ascorbic Acid (VITAMIN C) 1000 MG tablet Take 1,000 mg by mouth daily.    . Ca & Phos-Vit D-Mag (CALCIUM) [phone] TABS Take by mouth.    . Calcium Carb-Cholecalciferol (CALCIUM + D3) 600-200 MG-UNIT TABS Take 1 tablet by mouth 2 (two) times daily.    . celecoxib (CELEBREX) 100 MG capsule TAKE 1 CAPSULE(100 MG) BY MOUTH TWICE DAILY 180 capsule 2  . Cholecalciferol (VITAMIN D3) 250 MCG (10000 UT) TABS Take by mouth.    . estradiol (ESTRACE) 0.1 MG/GM vaginal cream Place 1 Applicatorful vaginally at bedtime.    . famotidine (PEPCID) 40 MG tablet Take 1 tablet (40 mg total) by mouth 2 (two) times daily. 180 tablet 1  . fluticasone (FLONASE) 50 MCG/ACT nasal spray Can use one spray in each nostril once daily as directed. 16 g 0  . HYDROcodone-acetaminophen (NORCO/VICODIN) 5-325 MG tablet every 8 (eight) hours as needed.   0  .  Hypromellose (ARTIFICIAL TEARS OP) Apply 1 drop to eye as needed.    . loratadine (CLARITIN) 10 MG tablet Take 10 mg by mouth daily.    . Multiple Vitamins-Minerals (MULTIVITAMIN PO) Take 1 tablet by mouth daily.    . mupirocin ointment (BACTROBAN) 2 % APP SML AMT EXT AA TID    . ondansetron (ZOFRAN ODT) 4 MG disintegrating tablet Take 1 tablet (4 mg total) by mouth every 8 (eight) hours as needed for nausea or vomiting. 20 tablet 0  . simvastatin (ZOCOR) 40 MG tablet TAKE 1 TABLET(40 MG) BY MOUTH DAILY 90 tablet 2  . SYMBICORT 80-4.5 MCG/ACT inhaler INHALE 2 PUFFS INTO LUNGS TWICE DAILY, IN THE MORNING AND EVENING 30.6 g 6  . tamsulosin (FLOMAX) 0.4 MG CAPS capsule Take 0.4 mg by mouth daily.    Marland Kitchen VITAMIN E PO Take 1 capsule by mouth daily.     No current facility-administered medications on file prior to visit.   Past Medical History:  Diagnosis Date  . Allergic rhinitis 02/16/2015  . GERD (gastroesophageal reflux disease)   . Iliotibial band syndrome of left side 06/17/2015  . 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 Left knee pain 10/01/2014  . Migraines 1943 to present   "fairly frequently"; Takes Propranolol (01/12/2015)  . 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  . Overactive bladder    Takes Vesicare  . Primary osteoarthritis of left knee 12/03/2014  . 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   Past Surgical  History:  Procedure Laterality Date  . BREAST BIOPSY Left ~ 1973  . CATARACT EXTRACTION W/ INTRAOCULAR LENS  IMPLANT, BILATERAL Bilateral 2003  . CHEST TUBE INSERTION  January 2015   X 2 at [facility]  . DILATION AND CURETTAGE OF UTERUS  4-5  . JOINT REPLACEMENT    . REPLACEMENT TOTAL KNEE Left   . TONSILLECTOMY AND ADENOIDECTOMY  1946; 1947  . TOTAL KNEE ARTHROPLASTY Left 01/11/2015  . TOTAL KNEE ARTHROPLASTY Left 01/11/2015   Procedure: Left TOTAL KNEE ARTHROPLASTY;  Surgeon: Dannielle Huh, MD;  Location: [facility] OR;  Service: Orthopedics;   Laterality: Left;  Marland Kitchen VIDEO ASSISTED THORACOSCOPY (VATS)/THOROCOTOMY Left 07/13/2013   VATS with insertion of chest tubes    History reviewed. No pertinent family history. Social History   Socioeconomic History  . Marital status: Widowed    Spouse name: Not on file  . Number of children: Not on file  . Years of education: Not on file  . Highest education level: Not on file  Occupational History  . Not on file  Tobacco Use  . Smoking status: Never Smoker  . Smokeless tobacco: Never Used  Vaping Use  . Vaping Use: Never used  Substance and Sexual Activity  . Alcohol use: Yes    Alcohol/week: 1.0 standard drink    Types: 1 Glasses of wine per week  . Drug use: No  . Sexual activity: Never  Other Topics Concern  . Not on file  Social History Narrative  . Not on file   Social Determinants of Health   Financial Resource Strain: Not on file  Food Insecurity: Not on file  Transportation Needs: Not on file  Physical Activity: Not on file  Stress: Not on file  Social Connections: Not on file    Review of Systems  Constitutional: Negative for activity change and appetite change.  HENT: Negative for sinus pain.   Eyes: Negative for visual disturbance.  Respiratory: Negative for chest tightness and shortness of breath.   Cardiovascular: Negative for chest pain, palpitations and leg swelling.  Gastrointestinal: Negative for abdominal distention and abdominal pain.  Endocrine: Negative for polyuria.  Genitourinary: Negative for difficulty urinating and urgency.  Musculoskeletal: Positive for arthralgias. Negative for back pain.  Skin: Negative.   Neurological: Negative.   Psychiatric/Behavioral: Negative.      Objective:  BP 130/74 (BP Location: Left Arm, Patient Position: Sitting, Cuff Size: Normal)   Pulse 84   Temp (!) 97.4 F (36.3 C) (Temporal)   Resp 16   Ht 5' (1.524 m)   Wt 147 lb 3.2 oz (66.8 kg)   SpO2 96%   BMI 28.75 kg/m   BP/Weight 09/16/2020 06/04/2020  03/09/2020  Systolic BP 130 - 122  Diastolic BP 74 - 80  Wt. (Lbs) 147.2 140 137.6  BMI 28.75 27.34 26.87    Physical Exam Vitals reviewed.  Constitutional:      Appearance: Normal appearance.  HENT:     Head: Normocephalic.     Right Ear: Tympanic membrane, ear canal and external ear normal.     Left Ear: Tympanic membrane, ear canal and external ear normal.     Mouth/Throat:     Mouth: Mucous membranes are moist.     Pharynx: Oropharynx is clear.  Eyes:     Extraocular Movements: Extraocular movements intact.     Conjunctiva/sclera: Conjunctivae normal.     Pupils: Pupils are equal, round, and reactive to light.  Cardiovascular:     Pulses: Normal pulses.  Heart sounds: Normal heart sounds. No murmur heard. No gallop.   Pulmonary:     Effort: Pulmonary effort is normal. No respiratory distress.     Breath sounds: Normal breath sounds. No rales.  Abdominal:     General: Abdomen is flat. Bowel sounds are normal. There is no distension.     Palpations: Abdomen is soft.     Tenderness: There is no abdominal tenderness.  Musculoskeletal:        General: Tenderness (knees) present. Normal range of motion.     Cervical back: Normal range of motion and neck supple.  Skin:    General: Skin is warm and dry.     Capillary Refill: Capillary refill takes less than 2 seconds.  Neurological:     General: No focal deficit present.     Mental Status: She is alert.       Lab Results  Component Value Date   WBC 7.2 03/09/2020   HGB 12.8 03/09/2020   HCT 39.5 03/09/2020   PLT 385 03/09/2020   GLUCOSE 100 (H) 03/09/2020   CHOL 166 03/09/2020   TRIG 130 03/09/2020   HDL 64 03/09/2020   LDLCALC 79 03/09/2020   ALT 14 03/09/2020   AST 22 03/09/2020   NA 144 03/09/2020   K 4.9 03/09/2020   CL 104 03/09/2020   CREATININE 1.04 (H) 03/09/2020   BUN 26 03/09/2020   CO2 23 03/09/2020   INR 1.02 01/01/2015      Assessment & Plan:   1. Moderate persistent asthma,  unspecified whether complicated This patient has asthma mild and is on albuterol.  Patient is not having a flair.  Chronic medicines include albuterol PRN. Addition new medicines none.  Asthma action plan is in place.   2. Essential hypertension - CBC with Differential/Platelet - Comprehensive metabolic panel An individual hypertension care plan was established and reinforced today.  The patient's status was assessed using clinical findings on exam and labs or diagnostic tests. The patient's success at meeting treatment goals on disease specific evidence-based guidelines and found to be fair controlled. SELF MANAGEMENT: The patient and I together assessed ways to personally work towards obtaining the recommended goals. RECOMMENDATIONS: avoid decongestants found in common cold remedies, decrease consumption of alcohol, perform routine monitoring of BP with home BP cuff, exercise, reduction of dietary salt, take medicines as prescribed, try not to miss doses and quit smoking.  Regular exercise and maintaining a healthy weight is needed.  Stress reduction may help. A CLINICAL SUMMARY including written plan identify barriers to care unique to individual due to social or financial issues.  We attempt to mutually creat solutions for individual and family understanding.  3. Chronic diarrhea - Ambulatory referral to Gastroenterology Diarrhea has improved on low gluten diet  4. Mixed hyperlipidemia - Lipid panel AN INDIVIDUAL CARE PLAN for hyperlipidemia/ cholesterol was established and reinforced today.  The patient's status was assessed using clinical findings on exam, lab and other diagnostic tests. The patient's disease status was assessed based on evidence-based guidelines and found to be fair controlled. MEDICATIONS were reviewed. SELF MANAGEMENT GOALS have been discussed and patient's success at attaining the goal of low cholesterol was assessed. RECOMMENDATION given include regular exercise 3 days  a week and low cholesterol/low fat diet. CLINICAL SUMMARY including written plan to identify barriers unique to the patient due to social or economic  reasons was discussed.  5. Gastroesophageal reflux disease without esophagitis Plan of care was formulated today.  She is doing  well.  A plan of care was formulated using patient exam, tests and other sources to optimize care using evidence based information.  Recommend no smoking, no eating after supper, avoid fatty foods, elevate Head of bed, avoid tight fitting clothing.  Continue on pepcid.  6. Generalized osteoarthritis AN INDIVIDUAL CARE PLAN for arthritis was established and reinforced today.  The patient's status was assessed using clinical findings on exam, labs, and other diagnostic testing. Patient's success at meeting treatment goals based on disease specific evidence-bassed guidelines and found to be in good control. RECOMMENDATIONS include maintaining present medicines and treatment.         I spent 30 minutes dedicated to the care of this patient on the date of this encounter to include face-to-face time with the patient, as well as: review old records  Follow-up: Return in about 6 months (around 03/19/2021) for fasting.  An After Visit Summary was printed and given to the patient.  Brent BullaLawrence Gwynn Crossley, MD [facility] [phone]

## 2020-09-17 LAB — COMPREHENSIVE METABOLIC PANEL
ALT: 17 IU/L (ref 0–32)
AST: 25 IU/L (ref 0–40)
Albumin/Globulin Ratio: 2 (ref 1.2–2.2)
Albumin: 4.5 g/dL (ref 3.7–4.7)
Alkaline Phosphatase: 77 IU/L (ref 44–121)
BUN/Creatinine Ratio: 22 (ref 12–28)
BUN: 22 mg/dL (ref 8–27)
Bilirubin Total: 0.3 mg/dL (ref 0.0–1.2)
CO2: 22 mmol/L (ref 20–29)
Calcium: 10.4 mg/dL — ABNORMAL HIGH (ref 8.7–10.3)
Chloride: 101 mmol/L (ref 96–106)
Creatinine, Ser: 0.98 mg/dL (ref 0.57–1.00)
Globulin, Total: 2.2 g/dL (ref 1.5–4.5)
Glucose: 103 mg/dL — ABNORMAL HIGH (ref 65–99)
Potassium: 4.6 mmol/L (ref 3.5–5.2)
Sodium: 141 mmol/L (ref 134–144)
Total Protein: 6.7 g/dL (ref 6.0–8.5)
eGFR: 59 mL/min/{1.73_m2} — ABNORMAL LOW (ref >59)

## 2020-09-17 LAB — CBC WITH DIFFERENTIAL/PLATELET
Basophils Absolute: 0.1 10*3/uL (ref 0.0–0.2)
Basos: 1 %
EOS (ABSOLUTE): 0.4 10*3/uL (ref 0.0–0.4)
Eos: 6 %
Hematocrit: 36 % (ref 34.0–46.6)
Hemoglobin: 12.2 g/dL (ref 11.1–15.9)
Immature Grans (Abs): 0 10*3/uL (ref 0.0–0.1)
Immature Granulocytes: 0 %
Lymphocytes Absolute: 2.1 10*3/uL (ref 0.7–3.1)
Lymphs: 33 %
MCH: 30.8 pg (ref 26.6–33.0)
MCHC: 33.9 g/dL (ref 31.5–35.7)
MCV: 91 fL (ref 79–97)
Monocytes Absolute: 0.4 10*3/uL (ref 0.1–0.9)
Monocytes: 7 %
Neutrophils Absolute: 3.3 10*3/uL (ref 1.4–7.0)
Neutrophils: 53 %
Platelets: 319 10*3/uL (ref 150–450)
RBC: 3.96 x10E6/uL (ref 3.77–5.28)
RDW: 13.1 % (ref 11.7–15.4)
WBC: 6.3 10*3/uL (ref 3.4–10.8)

## 2020-09-17 LAB — CARDIOVASCULAR RISK ASSESSMENT

## 2020-09-17 LAB — VITAMIN D 25 HYDROXY (VIT D DEFICIENCY, FRACTURES): Vit D, 25-Hydroxy: 59.1 ng/mL (ref 30.0–100.0)

## 2020-09-17 LAB — LIPID PANEL
Chol/HDL Ratio: 2 ratio (ref 0.0–4.4)
Cholesterol, Total: 172 mg/dL (ref 100–199)
HDL: 84 mg/dL (ref >39)
LDL Chol Calc (NIH): 73 mg/dL (ref 0–99)
Triglycerides: 83 mg/dL (ref 0–149)
VLDL Cholesterol Cal: 15 mg/dL (ref 5–40)

## 2020-09-17 NOTE — Progress Notes (Signed)
Vitamin D 59.1 normal level, can stop supplements, CBC normal, glucose 103 , kidney tests improved, Calcium still high, stop vitamin d and calcium supplements, Cholesterol normal,

## 2020-10-05 ENCOUNTER — Ambulatory Visit (INDEPENDENT_AMBULATORY_CARE_PROVIDER_SITE_OTHER): Payer: Medicare Other

## 2020-10-05 ENCOUNTER — Other Ambulatory Visit: Payer: Self-pay

## 2020-10-05 VITALS — BP 138/70 | HR 82 | Temp 97.8°F | Resp 16 | Ht <= 58 in | Wt 147.8 lb

## 2020-10-05 DIAGNOSIS — N959 Unspecified menopausal and perimenopausal disorder: Secondary | ICD-10-CM

## 2020-10-05 DIAGNOSIS — Z0000 Encounter for general adult medical examination without abnormal findings: Secondary | ICD-10-CM | POA: Diagnosis not present

## 2020-10-05 DIAGNOSIS — Z9189 Other specified personal risk factors, not elsewhere classified: Secondary | ICD-10-CM

## 2020-10-05 DIAGNOSIS — Z1231 Encounter for screening mammogram for malignant neoplasm of breast: Secondary | ICD-10-CM | POA: Diagnosis not present

## 2020-10-05 NOTE — Patient Instructions (Signed)
 Bone Density Test A bone density test uses a type of X-ray to measure the amount of calcium and other minerals in a person's bones. It can measure bone density in the hip and the spine. The test is similar to having a regular X-ray. This test may also be called:  Bone densitometry.  Bone mineral density test.  Dual-energy X-ray absorptiometry (DEXA). You may have this test to:  Diagnose a condition that causes weak or thin bones (osteoporosis).  Screen you for osteoporosis.  Predict your risk for a broken bone (fracture).  Determine how well your osteoporosis treatment is working. Tell a health care provider about:  Any allergies you have.  All medicines you are taking, including vitamins, herbs, eye drops, creams, and over-the-counter medicines.  Any problems you or family members have had with anesthetic medicines.  Any blood disorders you have.  Any surgeries you have had.  Any medical conditions you have.  Whether you are pregnant or may be pregnant.  Any medical tests you have had within the past 14 days that used contrast material. What are the risks? Generally, this is a safe test. However, it does expose you to a small amount of radiation, which can slightly increase your cancer risk. What happens before the test?  Do not take any calcium supplements within the 24 hours before your test.  You will need to remove all metal jewelry, eyeglasses, removable dental appliances, and any other metal objects on your body. What happens during the test?  You will lie down on an exam table. There will be an X-ray generator below you and an imaging device above you.  Other devices, such as boxes or braces, may be used to position your body properly for the scan.  The machine will slowly scan your body. You will need to keep very still while the machine does the scan.  The images will show up on a screen in the room. Images will be examined by a specialist after your  test is finished. The procedure may vary among health care providers and hospitals.   What can I expect after the test? It is up to you to get the results of your test. Ask your health care provider, or the department that is doing the test, when your results will be ready. Summary  A bone density test is an imaging test that uses a type of X-ray to measure the amount of calcium and other minerals in your bones.  The test may be used to diagnose or screen you for a condition that causes weak or thin bones (osteoporosis), predict your risk for a broken bone (fracture), or determine how well your osteoporosis treatment is working.  Do not take any calcium supplements within 24 hours before your test.  Ask your health care provider, or the department that is doing the test, when your results will be ready. This information is not intended to replace advice given to you by your health care provider. Make sure you discuss any questions you have with your health care provider. Document Revised: 11/27/2019 Document Reviewed: 11/27/2019 Elsevier Patient Education  2021 [employer]   Fall Prevention in the Home, Adult Falls can cause injuries and can happen to people of all ages. There are many things you can do to make your home safe and to help prevent falls. Ask for help when making these changes. What actions can I take to prevent falls? General Instructions  Use good lighting in all rooms. Replace   any light bulbs that burn out.  Turn on the lights in dark areas. Use night-lights.  Keep items that you use often in easy-to-reach places. Lower the shelves around your home if needed.  Set up your furniture so you have a clear path. Avoid moving your furniture around.  Do not have throw rugs or other things on the floor that can make you trip.  Avoid walking on wet floors.  If any of your floors are uneven, fix them.  Add color or contrast paint or tape to clearly mark and help you  see: ? Grab bars or handrails. ? First and last steps of staircases. ? Where the edge of each step is.  If you use a stepladder: ? Make sure that it is fully opened. Do not climb a closed stepladder. ? Make sure the sides of the stepladder are locked in place. ? Ask someone to hold the stepladder while you use it.  Know where your pets are when moving through your home. What can I do in the bathroom?  Keep the floor dry. Clean up any water on the floor right away.  Remove soap buildup in the tub or shower.  Use nonskid mats or decals on the floor of the tub or shower.  Attach bath mats securely with double-sided, nonslip rug tape.  If you need to sit down in the shower, use a plastic, nonslip stool.  Install grab bars by the toilet and in the tub and shower. Do not use towel bars as grab bars.      What can I do in the bedroom?  Make sure that you have a light by your bed that is easy to reach.  Do not use any sheets or blankets for your bed that hang to the floor.  Have a firm chair with side arms that you can use for support when you get dressed. What can I do in the kitchen?  Clean up any spills right away.  If you need to reach something above you, use a step stool with a grab bar.  Keep electrical cords out of the way.  Do not use floor polish or wax that makes floors slippery. What can I do with my stairs?  Do not leave any items on the stairs.  Make sure that you have a light switch at the top and the bottom of the stairs.  Make sure that there are handrails on both sides of the stairs. Fix handrails that are broken or loose.  Install nonslip stair treads on all your stairs.  Avoid having throw rugs at the top or bottom of the stairs.  Choose a carpet that does not hide the edge of the steps on the stairs.  Check carpeting to make sure that it is firmly attached to the stairs. Fix carpet that is loose or worn. What can I do on the outside of my  home?  Use bright outdoor lighting.  Fix the edges of walkways and driveways and fix any cracks.  Remove anything that might make you trip as you walk through a door, such as a raised step or threshold.  Trim any bushes or trees on paths to your home.  Check to see if handrails are loose or broken and that both sides of all steps have handrails.  Install guardrails along the edges of any raised decks and porches.  Clear paths of anything that can make you trip, such as tools or rocks.  Have leaves, snow, or   ice cleared regularly.  Use sand or salt on paths during winter.  Clean up any spills in your garage right away. This includes grease or oil spills. What other actions can I take?  Wear shoes that: ? Have a low heel. Do not wear high heels. ? Have rubber bottoms. ? Feel good on your feet and fit well. ? Are closed at the toe. Do not wear open-toe sandals.  Use tools that help you move around if needed. These include: ? Canes. ? Walkers. ? Scooters. ? Crutches.  Review your medicines with your doctor. Some medicines can make you feel dizzy. This can increase your chance of falling. Ask your doctor what else you can do to help prevent falls. Where to find more information  Centers for Disease Control and Prevention, [employer]: www.cdc.gov  National Institute on Aging: www.nia.nih.gov Contact a doctor if:  You are afraid of falling at home.  You feel weak, drowsy, or dizzy at home.  You fall at home. Summary  There are many simple things that you can do to make your home safe and to help prevent falls.  Ways to make your home safe include removing things that can make you trip and installing grab bars in the bathroom.  Ask for help when making these changes in your home. This information is not intended to replace advice given to you by your health care provider. Make sure you discuss any questions you have with your health care provider. Document Revised:  01/14/2020 Document Reviewed: 01/14/2020 Elsevier Patient Education  2021 [employer]   Health Maintenance, Female Adopting a healthy lifestyle and getting preventive care are important in promoting health and wellness. Ask your health care provider about:  The right schedule for you to have regular tests and exams.  Things you can do on your own to prevent diseases and keep yourself healthy. What should I know about diet, weight, and exercise? Eat a healthy diet  Eat a diet that includes plenty of vegetables, fruits, low-fat dairy products, and lean protein.  Do not eat a lot of foods that are high in solid fats, added sugars, or sodium.   Maintain a healthy weight Body mass index (BMI) is used to identify weight problems. It estimates body fat based on height and weight. Your health care provider can help determine your BMI and help you achieve or maintain a healthy weight. Get regular exercise Get regular exercise. This is one of the most important things you can do for your health. Most adults should:  Exercise for at least 150 minutes each week. The exercise should increase your heart rate and make you sweat (moderate-intensity exercise).  Do strengthening exercises at least twice a week. This is in addition to the moderate-intensity exercise.  Spend less time sitting. Even light physical activity can be beneficial. Watch cholesterol and blood lipids Have your blood tested for lipids and cholesterol at 79 years of age, then have this test every 5 years. Have your cholesterol levels checked more often if:  Your lipid or cholesterol levels are high.  You are older than 79 years of age.  You are at high risk for heart disease. What should I know about cancer screening? Depending on your health history and family history, you may need to have cancer screening at various ages. This may include screening for:  Breast cancer.  Cervical cancer.  Colorectal cancer.  Skin  cancer.  Lung cancer. What should I know about heart disease, diabetes, and high   blood pressure? Blood pressure and heart disease  High blood pressure causes heart disease and increases the risk of stroke. This is more likely to develop in people who have high blood pressure readings, are of African descent, or are overweight.  Have your blood pressure checked: ? Every 3-5 years if you are 18-39 years of age. ? Every year if you are 40 years old or older. Diabetes Have regular diabetes screenings. This checks your fasting blood sugar level. Have the screening done:  Once every three years after age 40 if you are at a normal weight and have a low risk for diabetes.  More often and at a younger age if you are overweight or have a high risk for diabetes. What should I know about preventing infection? Hepatitis B If you have a higher risk for hepatitis B, you should be screened for this virus. Talk with your health care provider to find out if you are at risk for hepatitis B infection. Hepatitis C Testing is recommended for:  Everyone born from 1945 through 1965.  Anyone with known risk factors for hepatitis C. Sexually transmitted infections (STIs)  Get screened for STIs, including gonorrhea and chlamydia, if: ? You are sexually active and are younger than 79 years of age. ? You are older than 79 years of age and your health care provider tells you that you are at risk for this type of infection. ? Your sexual activity has changed since you were last screened, and you are at increased risk for chlamydia or gonorrhea. Ask your health care provider if you are at risk.  Ask your health care provider about whether you are at high risk for HIV. Your health care provider may recommend a prescription medicine to help prevent HIV infection. If you choose to take medicine to prevent HIV, you should first get tested for HIV. You should then be tested every 3 months for as long as you are taking  the medicine. Pregnancy  If you are about to stop having your period (premenopausal) and you may become pregnant, seek counseling before you get pregnant.  Take 400 to 800 micrograms (mcg) of folic acid every day if you become pregnant.  Ask for birth control (contraception) if you want to prevent pregnancy. Osteoporosis and menopause Osteoporosis is a disease in which the bones lose minerals and strength with aging. This can result in bone fractures. If you are 65 years old or older, or if you are at risk for osteoporosis and fractures, ask your health care provider if you should:  Be screened for bone loss.  Take a calcium or vitamin D supplement to lower your risk of fractures.  Be given hormone replacement therapy (HRT) to treat symptoms of menopause. Follow these instructions at home: Lifestyle  Do not use any products that contain nicotine or tobacco, such as cigarettes, e-cigarettes, and chewing tobacco. If you need help quitting, ask your health care provider.  Do not use street drugs.  Do not share needles.  Ask your health care provider for help if you need support or information about quitting drugs. Alcohol use  Do not drink alcohol if: ? Your health care provider tells you not to drink. ? You are pregnant, may be pregnant, or are planning to become pregnant.  If you drink alcohol: ? Limit how much you use to 0-1 drink a day. ? Limit intake if you are breastfeeding.  Be aware of how much alcohol is in your drink. In the [place]   one drink equals one 12 oz bottle of beer (355 mL), one 5 oz glass of wine (148 mL), or one 1 oz glass of hard liquor (44 mL). General instructions  Schedule regular health, dental, and eye exams.  Stay current with your vaccines.  Tell your health care provider if: ? You often feel depressed. ? You have ever been abused or do not feel safe at home. Summary  Adopting a healthy lifestyle and getting preventive care are important in  promoting health and wellness.  Follow your health care provider's instructions about healthy diet, exercising, and getting tested or screened for diseases.  Follow your health care provider's instructions on monitoring your cholesterol and blood pressure. This information is not intended to replace advice given to you by your health care provider. Make sure you discuss any questions you have with your health care provider. Document Revised: 06/05/2018 Document Reviewed: 06/05/2018 Elsevier Patient Education  2021 [employer]  

## 2020-10-05 NOTE — Progress Notes (Signed)
Subjective:   Shannon Martin is a 80 y.o. female who presents for Medicare Annual (Subsequent) preventive examination.  This wellness visit is conducted by a nurse.  The patient's medications were reviewed and reconciled since the patient's last visit.  History details were provided by the patient.  The history appears to be reliable.    Patient's last AWV was one year ago.   Medical History: Patient history and Family history was reviewed  Medications, Allergies, and preventative health maintenance was reviewed and updated.   Review of Systems    Review of Systems  Constitutional: Negative.   HENT: Negative.   Eyes: Negative.   Respiratory: Negative.  Negative for cough, shortness of breath and wheezing.   Cardiovascular: Negative.  Negative for chest pain and palpitations.  Gastrointestinal: Negative.   Genitourinary:       Overactive bladder  Musculoskeletal: Positive for arthralgias.  Neurological: Positive for headaches. Negative for dizziness, weakness and numbness.  Psychiatric/Behavioral: Negative.  Negative for confusion, dysphoric mood, hallucinations and suicidal ideas. The patient is not nervous/anxious.    Cardiac Risk Factors include: advanced age (>83men, >68 women);hypertension;obesity (BMI >30kg/m2)     Objective:     10/05/20 0958  BP: 138/70  Pulse: 82  Resp: 16  Temp: 97.8 F (36.6 C)  SpO2: 98%  Weight: 147 lb 12.8 oz (67 kg)  Height: 4\' 10"  (1.473 m)  PainSc: 0-No pain   Body mass index is 30.89 kg/m.  Advanced Directives 10/05/2020 01/12/2015 01/01/2015  Does Patient Have a Medical Advance Directive? Yes Yes Yes  Type of 03/04/2015 of Estate agent Power of [place];Living will Healthcare Power of [place];Living will  Does patient want to make changes to medical advance directive? No - Patient declined No - Patient declined -  Copy of Healthcare Power of Attorney in Chart? No - copy requested Yes Yes    Current  Medications (verified) Outpatient Encounter Medications as of 10/05/2020  Medication Sig  . albuterol (PROVENTIL HFA;VENTOLIN HFA) 108 (90 Base) MCG/ACT inhaler Inhale two puffs every four to six hours as needed for cough or wheeze.  . Ascorbic Acid (VITAMIN C) 1000 MG tablet Take 1,000 mg by mouth daily.  . Ca & Phos-Vit D-Mag (CALCIUM) [phone] TABS Take by mouth.  . Calcium Carb-Cholecalciferol (CALCIUM + D3) 600-200 MG-UNIT TABS Take 1 tablet by mouth 2 (two) times daily.  . celecoxib (CELEBREX) 100 MG capsule Take 1 capsule (100 mg total) by mouth 2 (two) times daily.  . Cholecalciferol (VITAMIN D3) 250 MCG (10000 UT) TABS Take by mouth.  . estradiol (ESTRACE) 0.1 MG/GM vaginal cream Place 1 Applicatorful vaginally at bedtime.  . famotidine (PEPCID) 40 MG tablet Take 1 tablet (40 mg total) by mouth 2 (two) times daily.  . fluticasone (FLONASE) 50 MCG/ACT nasal spray Can use one spray in each nostril once daily as directed.  [phone] HYDROcodone-acetaminophen (NORCO/VICODIN) 5-325 MG tablet every 8 (eight) hours as needed.   . Hypromellose (ARTIFICIAL TEARS OP) Apply 1 drop to eye as needed.  . loratadine (CLARITIN) 10 MG tablet Take 10 mg by mouth daily.  . Multiple Vitamins-Minerals (MULTIVITAMIN PO) Take 1 tablet by mouth daily.  . mupirocin ointment (BACTROBAN) 2 % APP SML AMT EXT AA TID  . ondansetron (ZOFRAN ODT) 4 MG disintegrating tablet Take 1 tablet (4 mg total) by mouth every 8 (eight) hours as needed for nausea or vomiting.  . simvastatin (ZOCOR) 40 MG tablet TAKE 1 TABLET(40 MG) BY MOUTH DAILY  .  SYMBICORT 80-4.5 MCG/ACT inhaler INHALE 2 PUFFS INTO LUNGS TWICE DAILY, IN THE MORNING AND EVENING  . tamsulosin (FLOMAX) 0.4 MG CAPS capsule Take 0.4 mg by mouth daily.  Marland Kitchen. VITAMIN E PO Take 1 capsule by mouth daily.   No facility-administered encounter medications on file as of 10/05/2020.    Allergies (verified) Tetanus toxoids, Bee venom, Hornet venom, Dilaudid [hydromorphone],  Erythromycin, Fiorinal [butalbital-aspirin-caffeine], Amoxicillin, Doxycycline, Latex, Neomycin, and Penicillins   History: Past Medical History:  Diagnosis Date  . Allergic rhinitis 02/16/2015  . GERD (gastroesophageal reflux disease)   . Iliotibial band syndrome of left side 06/17/2015  . 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 Left knee pain 10/01/2014  . Migraines 1943 to present   "fairly frequently"; Takes Propranolol (01/12/2015)  . 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  . Overactive bladder    Takes Vesicare  . Primary osteoarthritis of left knee 12/03/2014  . 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 BREAST BIOPSY Left ~ 1973  . CATARACT EXTRACTION W/ INTRAOCULAR LENS  IMPLANT, BILATERAL Bilateral 2003  . CHEST TUBE INSERTION  January 2015   [facility]  . DILATION AND CURETTAGE OF UTERUS  4-5  . JOINT REPLACEMENT    . REPLACEMENT TOTAL KNEE Left   . TONSILLECTOMY AND ADENOIDECTOMY  1946; 1947  . TOTAL KNEE ARTHROPLASTY Left 01/11/2015  . TOTAL KNEE ARTHROPLASTY Left 01/11/2015   Procedure: Left TOTAL KNEE ARTHROPLASTY;  Surgeon: Dannielle HuhSteve Lucey, MD;  Location: [facility] OR;  Service: Orthopedics;  Laterality: Left;  Marland Kitchen. VIDEO ASSISTED THORACOSCOPY (VATS)/THOROCOTOMY Left 07/13/2013   VATS with insertion of chest tubes   History reviewed. No pertinent family history. Social History   Socioeconomic History  . Marital status: Widowed    Spouse name: Not on file  . Number of children: 1  . Years of education: Not on file  . Highest education level: Not on file  Tobacco Use  . Smoking status: Never Smoker  . Smokeless tobacco: Never Used  Vaping Use  . Vaping Use: Never used  Substance and Sexual Activity  . Alcohol use: Yes    Alcohol/week: 2.0 standard drinks    Types: 2 Glasses of wine per week  . Drug use: No  . Sexual activity: Not Currently   Social Determinants of Health    Financial Resource Strain: Not on file  Food Insecurity: No Food Insecurity  . Worried About Programme researcher, broadcasting/film/videounning Out of Food in the Last Year: Never true  . Ran Out of Food in the Last Year: Never true  Transportation Needs: No Transportation Needs  . Lack of Transportation (Medical): No  . Lack of Transportation (Non-Medical): No  Physical Activity: Not on file  Stress: Not on file  Social Connections: Not on file    Tobacco Counseling Counseling given: No   Clinical Intake:  Pre-visit preparation completed: Yes  Pain : No/denies pain Pain Score: 0-No pain   BMI - recorded: 30.89 Nutritional Status: BMI > 30  Obese Nutritional Risks: None Diabetes: No  How often do you need to have someone help you when you read instructions, pamphlets, or other written materials from your doctor or pharmacy?: 1 - Never  Interpreter Needed?: No    Activities of Daily Living In your present state of health, do you have any difficulty performing the following activities: 10/05/2020 09/16/2020  Hearing? N N  Vision? N N  Difficulty concentrating or making decisions? N  N  Walking or climbing stairs? N N  Dressing or bathing? N N  Doing errands, shopping? N N  Preparing Food and eating ? N -  Using the Toilet? N -  In the past six months, have you accidently leaked urine? Y -  Do you have problems with loss of bowel control? N -  Managing your Medications? N -  Managing your Finances? N -  Housekeeping or managing your Housekeeping? N -  Some recent data might be hidden    Patient Care Team: Abigail Miyamoto, MD as PCP - General (Family Medicine) Prescilla Sours, FNP as Nurse Practitioner (Family Medicine)      Assessment:   This is a routine wellness examination for [facility].  Hearing/Vision screen Patient has upcoming appointment with Dr Caryn Section to have vision exam  Dietary issues and exercise activities discussed: Current Exercise Habits: Home exercise routine;Structured exercise  class, Type of exercise: walking;Other - see comments (exercise bicycle), Time (Minutes): 30, Frequency (Times/Week): 3, Weekly Exercise (Minutes/Week): 90, Intensity: Mild, Exercise limited by: None identified  Goals   None    Depression Screen PHQ 2/9 Scores 10/05/2020 11/26/2019  PHQ - 2 Score 0 0    Fall Risk Fall Risk  10/05/2020 09/16/2020 03/09/2020 03/09/2020  Falls in the past year? 0 0 0 0  Number falls in past yr: 0 0 0 0  Injury with Fall? 0 0 0 0  Risk for fall due to : No Fall Risks No Fall Risks - -  Follow up Falls evaluation completed;Education provided;Falls prevention discussed - Falls evaluation completed Falls evaluation completed    FALL RISK PREVENTION PERTAINING TO THE HOME:  Any stairs in or around the home? No  If so, are there any without handrails? No  Home free of loose throw rugs in walkways, pet beds, electrical cords, etc? Yes  Adequate lighting in your home to reduce risk of falls? Yes   ASSISTIVE DEVICES UTILIZED TO PREVENT FALLS:  Life alert? No  Use of a cane, walker or w/c? No  Grab bars in the bathroom? Yes  Shower chair or bench in shower? Yes  Elevated toilet seat or a handicapped toilet? Yes  Gait steady and fast without use of assistive device  Cognitive Function:     6CIT Screen 10/05/2020  What Year? 0 points  What month? 0 points  What time? 0 points  Count back from 20 0 points  Months in reverse 2 points  Repeat phrase 0 points  Total Score 2    Immunizations Immunization History  Administered Date(s) Administered  . Fluad Quad(high Dose 65+) 05/07/2020  . Influenza,inj,Quad PF,6+ Mos 03/28/2019  . PFIZER(Purple Top)SARS-COV-2 Vaccination 08/29/2019, 09/26/2019, 03/31/2020  . Pneumococcal Conjugate-13 03/15/2014  . Pneumococcal-Unspecified 09/09/2013    TDAP status: Patient has confirmed allergy to Tetanus Vaccine  Flu Vaccine status: Up to date  Pneumococcal vaccine status: Up to date  Covid-19 vaccine status:  Completed vaccines  Screening Tests Health Maintenance  Topic Date Due  . DEXA SCAN  Never done  . MAMMOGRAM  06/26/2019  . INFLUENZA VACCINE  01/24/2021  . COVID-19 Vaccine  Completed  . PNA vac Low Risk Adult  Completed  . HPV VACCINES  Aged Out    Health Maintenance  Health Maintenance Due  Topic Date Due  . Hepatitis C Screening  Never done  . DEXA SCAN  Never done  . MAMMOGRAM  06/26/2019    Colorectal cancer screening: No longer required.   Mammogram status:  Ordered   Bone Density status: Ordered  Lung Cancer Screening: (Low Dose CT Chest recommended if Age 79-80 years, 30 pack-year currently smoking OR have quit w/in 15years.) does not qualify.   Additional Screening:  Vision Screening: Recommended annual ophthalmology exams for early detection of glaucoma and other disorders of the eye. Is the patient up to date with their annual eye exam?  Yes  Who is the provider or what is the name of the office in which the patient attends annual eye exams? Dr Caryn Section  Dental Screening: Recommended annual dental exams for proper oral hygiene    Plan:    1- Screening Mammogram and DEXA scan have been ordered 2- Advance Directive - discussed, patient will bring copy for our file 3- Exercise - Patient will continue walking as tolerated and going to the [facility]  I have personally reviewed and noted the following in the patient's chart:   . Medical and social history . Use of alcohol, tobacco or illicit drugs  . Current medications and supplements . Functional ability and status . Nutritional status . Physical activity . Advanced directives . List of other physicians . Hospitalizations, surgeries, and ER visits in previous 12 months . Vitals . Screenings to include cognitive, depression, and falls . Referrals and appointments  In addition, I have reviewed and discussed with patient certain preventive protocols, quality metrics, and best practice recommendations. A written  personalized care plan for preventive services as well as general preventive health recommendations were provided to patient.     Jacklynn Bue, LPN   [id number]/[id number]

## 2020-10-13 ENCOUNTER — Telehealth: Payer: Self-pay | Admitting: Legal Medicine

## 2020-10-13 NOTE — Telephone Encounter (Signed)
   Shannon Martin has been scheduled for the following appointment:  WHAT: Dexa Scan WHERE: [facility] DATE: 10/19/20 TIME: Arrive @ 10:30 for 11:00 appt  Patient has been made aware.

## 2020-10-15 ENCOUNTER — Telehealth: Payer: Self-pay

## 2020-10-15 NOTE — Telephone Encounter (Signed)
I called to schedule patient for her mammogram and she declined due to age.  She will call us back to schedule if she changes her mind.

## 2020-10-19 DIAGNOSIS — M85852 Other specified disorders of bone density and structure, left thigh: Secondary | ICD-10-CM | POA: Diagnosis not present

## 2020-12-13 ENCOUNTER — Other Ambulatory Visit: Payer: Self-pay

## 2020-12-13 ENCOUNTER — Encounter: Payer: Self-pay | Admitting: Legal Medicine

## 2020-12-13 ENCOUNTER — Ambulatory Visit (INDEPENDENT_AMBULATORY_CARE_PROVIDER_SITE_OTHER): Payer: Medicare Other | Admitting: Legal Medicine

## 2020-12-13 VITALS — BP 120/60 | HR 68 | Temp 98.1°F | Resp 15 | Ht <= 58 in | Wt 144.0 lb

## 2020-12-13 DIAGNOSIS — L249 Irritant contact dermatitis, unspecified cause: Secondary | ICD-10-CM

## 2020-12-13 MED ORDER — TRIAMCINOLONE ACETONIDE 40 MG/ML IJ SUSP
80.0000 mg | Freq: Once | INTRAMUSCULAR | Status: AC
  Administered 2020-12-13: 80 mg via INTRAMUSCULAR

## 2020-12-13 MED ORDER — PREDNISONE 10 MG (21) PO TBPK: ORAL_TABLET | ORAL | 0 refills | Status: DC

## 2020-12-13 NOTE — Progress Notes (Signed)
Established Patient Office Visit  Subjective:  Patient ID: Shannon Martin, female    DOB: 1941/06/21  Age: 80 y.o. MRN: [id number]  CC:  Chief Complaint  Patient presents with   Rash    Patient noticed rash and pruritus over her body after she touched a kitty cat.     HPI Shannon Martin presents for rash 5 days ago.  She touched cat.  She now has rash on face arms and trunk. No respiratory embarrassment.  Confluent and vesicluar rash on face and chin.  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    Past Surgical History:  Procedure Laterality Date   BREAST BIOPSY Left ~ [place], BILATERAL Bilateral 2003   CHEST TUBE INSERTION  January 2015   X 2 at [facility]  4-5   JOINT REPLACEMENT     REPLACEMENT TOTAL KNEE Left    TONSILLECTOMY AND ADENOIDECTOMY  1946; 1947   TOTAL KNEE ARTHROPLASTY Left 01/11/2015   TOTAL KNEE ARTHROPLASTY Left 01/11/2015   Procedure: Left TOTAL KNEE ARTHROPLASTY;  Surgeon: Vickey Huger, MD;  Location: [facility];  Service: Orthopedics;  Laterality: Left;   VIDEO ASSISTED THORACOSCOPY (VATS)/THOROCOTOMY Left 07/13/2013   VATS with insertion of chest tubes    History reviewed. No pertinent family history.  Social History   Socioeconomic History   Marital status: Widowed    Spouse name: Not on file   Number of children: 1   Years of education: Not on file   Highest education level: Not on file   Occupational History   Not on file  Tobacco Use   Smoking status: Never   Smokeless tobacco: Never  Vaping Use   Vaping Use: Never used  Substance and Sexual Activity   Alcohol use: Yes    Alcohol/week: 2.0 standard drinks    Types: 2 Glasses of wine per week   Drug use: No   Sexual activity: Not Currently  Other Topics Concern   Not on file  Social History Narrative   Not on file   Social Determinants of Health   Financial Resource Strain: Not on file  Food Insecurity: No Food Insecurity   Worried About Running Out of Food in the Last Year: Never true   Ran Out of Food in the Last Year: Never true  Transportation Needs: No Transportation Needs   Lack of Transportation (Medical): No   Lack of Transportation (Non-Medical): No  Physical Activity: Not on file  Stress: Not on file  Social Connections: Not on file  Intimate Partner Violence: Not on file    Outpatient Medications Prior to Visit  Medication Sig Dispense Refill   albuterol (PROVENTIL HFA;VENTOLIN HFA) 108 (90 Base) MCG/ACT inhaler Inhale two puffs every four to six hours as needed for cough or wheeze. 1 Inhaler 1   Ascorbic Acid (VITAMIN C) 1000 MG tablet Take 1,000 mg by mouth daily.     Ca & Phos-Vit D-Mag (  CALCIUM) [phone] TABS Take by mouth.     Calcium Carb-Cholecalciferol (CALCIUM + D3) 600-200 MG-UNIT TABS Take 1 tablet by mouth 2 (two) times daily.     celecoxib (CELEBREX) 100 MG capsule Take 1 capsule (100 mg total) by mouth 2 (two) times daily. 180 capsule 2   Cholecalciferol (VITAMIN D3) 250 MCG (10000 UT) TABS Take by mouth.     estradiol (ESTRACE) 0.1 MG/GM vaginal cream Place 1 Applicatorful vaginally at bedtime.     famotidine (PEPCID) 40 MG tablet Take 1 tablet (40 mg total) by mouth 2 (two) times daily. 180 tablet 1   fluticasone (FLONASE) 50 MCG/ACT nasal spray Can use one spray in each nostril once daily as directed. 16 g 0   HYDROcodone-acetaminophen (NORCO/VICODIN) 5-325 MG tablet  every 8 (eight) hours as needed.   0   Hypromellose (ARTIFICIAL TEARS OP) Apply 1 drop to eye as needed.     loratadine (CLARITIN) 10 MG tablet Take 10 mg by mouth daily.     Multiple Vitamins-Minerals (HAIR SKIN AND NAILS FORMULA) TABS Take by mouth.     Multiple Vitamins-Minerals (MULTIVITAMIN PO) Take 1 tablet by mouth daily.     mupirocin ointment (BACTROBAN) 2 % APP SML AMT EXT AA TID     ondansetron (ZOFRAN ODT) 4 MG disintegrating tablet Take 1 tablet (4 mg total) by mouth every 8 (eight) hours as needed for nausea or vomiting. 20 tablet 0   simvastatin (ZOCOR) 40 MG tablet TAKE 1 TABLET(40 MG) BY MOUTH DAILY 90 tablet 2   SYMBICORT 80-4.5 MCG/ACT inhaler INHALE 2 PUFFS INTO LUNGS TWICE DAILY, IN THE MORNING AND EVENING 30.6 g 6   tamsulosin (FLOMAX) 0.4 MG CAPS capsule Take 0.4 mg by mouth daily.     VITAMIN E PO Take 1 capsule by mouth daily.     No facility-administered medications prior to visit.    Allergies  Allergen Reactions   Tetanus Toxoids Swelling and Other (See Comments)    Swelling around throat and jaws   Bee Venom Swelling and Other (See Comments)    Crusty area on skin   Hornet Venom Swelling    Other reaction(s): Other (See Comments) Crusty area on skin   Dilaudid [Hydromorphone] Nausea And Vomiting    Causes nausea and vomiting   Erythromycin Nausea And Vomiting   Fiorinal [Butalbital-Aspirin-Caffeine]     Altered mental status   Amoxicillin Other (See Comments)    Upset stomach   Doxycycline Other (See Comments)    Upset stomach   Latex Other (See Comments)    Skin will crack open and bleed   Neomycin Other (See Comments)    Irritates skin   Penicillins Itching, Swelling and Rash    ROS Review of Systems  Constitutional:  Negative for activity change and appetite change.  HENT:  Negative for congestion.   Eyes:  Negative for visual disturbance.  Respiratory:  Negative for chest tightness and shortness of breath.   Cardiovascular:  Negative for  chest pain, palpitations and leg swelling.  Gastrointestinal:  Negative for abdominal distention and abdominal pain.  Musculoskeletal: Negative.   Skin:  Positive for rash.  Neurological: Negative.   Psychiatric/Behavioral: Negative.       Objective:    Physical Exam Vitals reviewed.  Constitutional:      Appearance: Normal appearance.  Cardiovascular:     Rate and Rhythm: Normal rate and regular rhythm.     Pulses: Normal pulses.     Heart sounds: Normal heart  sounds. No murmur heard.   No gallop.  Pulmonary:     Effort: Pulmonary effort is normal. No respiratory distress.     Breath sounds: Normal breath sounds.  Skin:    Findings: Erythema and rash present.       Neurological:     Mental Status: She is alert.    BP 120/60   Pulse 68   Temp 98.1 F (36.7 C)   Resp 15   Ht _0  (1.473 m)   Wt 144 lb (65.3 kg)   SpO2 98%   BMI 30.10 kg/m  Wt Readings from Last 3 Encounters:  12/13/20 144 lb (65.3 kg)  10/05/20 147 lb 12.8 oz (67 kg)  09/16/20 147 lb 3.2 oz (66.8 kg)     Health Maintenance Due  Topic Date Due   Hepatitis C Screening  Never done   Zoster Vaccines- Shingrix (1 of 2) Never done   MAMMOGRAM  06/26/2019    There are no preventive care reminders to display for this patient.  No results found for: TSH Lab Results  Component Value Date   WBC 6.3 09/16/2020   HGB 12.2 09/16/2020   HCT 36.0 09/16/2020   MCV 91 09/16/2020   PLT 319 09/16/2020   Lab Results  Component Value Date   NA 141 09/16/2020   K 4.6 09/16/2020   CO2 22 09/16/2020   GLUCOSE 103 (H) 09/16/2020   BUN 22 09/16/2020   CREATININE 0.98 09/16/2020   BILITOT 0.3 09/16/2020   ALKPHOS 77 09/16/2020   AST 25 09/16/2020   ALT 17 09/16/2020   PROT 6.7 09/16/2020   ALBUMIN 4.5 09/16/2020   CALCIUM 10.4 (H) 09/16/2020   ANIONGAP 7 01/12/2015   EGFR 59 (L) 09/16/2020   Lab Results  Component Value Date   CHOL 172 09/16/2020   Lab Results  Component Value Date   HDL  84 09/16/2020   Lab Results  Component Value Date   LDLCALC 73 09/16/2020   Lab Results  Component Value Date   TRIG 83 09/16/2020   Lab Results  Component Value Date   CHOLHDL 2.0 09/16/2020   No results found for: HGBA1C    Assessment & Plan:     Diagnoses and all orders for this visit: Irritant contact dermatitis, unspecified trigger -     triamcinolone acetonide (KENALOG-40) injection 80 mg -     predniSONE (STERAPRED UNI-PAK 21 TAB) 10 MG (21) TBPK tablet; Take 6ills first day , then 5 pills day 2 and then cut down one pill day until gone  Patient is having contact dermatitis from plants. Follow-up: Return if symptoms worsen or fail to improve.    Reinaldo Meeker, MD

## 2020-12-15 DIAGNOSIS — H43393 Other vitreous opacities, bilateral: Secondary | ICD-10-CM | POA: Diagnosis not present

## 2020-12-21 ENCOUNTER — Ambulatory Visit (INDEPENDENT_AMBULATORY_CARE_PROVIDER_SITE_OTHER): Payer: Medicare Other | Admitting: Legal Medicine

## 2020-12-21 ENCOUNTER — Encounter: Payer: Self-pay | Admitting: Legal Medicine

## 2020-12-21 ENCOUNTER — Other Ambulatory Visit: Payer: Self-pay

## 2020-12-21 VITALS — BP 130/60 | HR 81 | Temp 97.5°F | Resp 16 | Ht <= 58 in | Wt 142.0 lb

## 2020-12-21 DIAGNOSIS — Z6829 Body mass index (BMI) 29.0-29.9, adult: Secondary | ICD-10-CM

## 2020-12-21 DIAGNOSIS — L219 Seborrheic dermatitis, unspecified: Secondary | ICD-10-CM

## 2020-12-21 HISTORY — DX: Seborrheic dermatitis, unspecified: L21.9

## 2020-12-21 MED ORDER — HYDROCORTISONE 1 % EX OINT
1.0000 | TOPICAL_OINTMENT | Freq: Two times a day (BID) | CUTANEOUS | 3 refills | Status: DC
Start: 2020-12-21 — End: 2021-05-04

## 2020-12-21 NOTE — Progress Notes (Signed)
Established Patient Office Visit  Subjective:  Patient ID: Shannon Martin, female    DOB: 02-28-1941  Age: 80 y.o. MRN: [id number]  CC:  Chief Complaint  Patient presents with   Rash    HPI LARUE DRAWDY presents for seborrheic dermatitis on face.  She was given injection which helped but she continues to have scaling and purities on eye brow, nasolabial folds  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    Past Surgical History:  Procedure Laterality Date   BREAST BIOPSY Left ~ [place], BILATERAL Bilateral 2003   CHEST TUBE INSERTION  January 2015   X 2 at [place]  4-5   JOINT REPLACEMENT     REPLACEMENT TOTAL KNEE Left    TONSILLECTOMY AND ADENOIDECTOMY  1946; 1947   TOTAL KNEE ARTHROPLASTY Left 01/11/2015   TOTAL KNEE ARTHROPLASTY Left 01/11/2015   Procedure: Left TOTAL KNEE ARTHROPLASTY;  Surgeon: Vickey Huger, MD;  Location: [facility];  Service: Orthopedics;  Laterality: Left;   VIDEO ASSISTED THORACOSCOPY (VATS)/THOROCOTOMY Left 07/13/2013   VATS with insertion of chest tubes    History reviewed. No pertinent family history.  Social History   Socioeconomic History   Marital status: Widowed    Spouse name: Not on file   Number of children: 1   Years of education: Not on file   Highest education level: Not on file  Occupational History   Not on file  Tobacco Use   Smoking status: Never   Smokeless tobacco: Never   Vaping Use   Vaping Use: Never used  Substance and Sexual Activity   Alcohol use: Yes    Alcohol/week: 2.0 standard drinks    Types: 2 Glasses of wine per week   Drug use: No   Sexual activity: Not Currently  Other Topics Concern   Not on file  Social History Narrative   Not on file   Social Determinants of Health   Financial Resource Strain: Not on file  Food Insecurity: No Food Insecurity   Worried About Running Out of Food in the Last Year: Never true   Ran Out of Food in the Last Year: Never true  Transportation Needs: No Transportation Needs   Lack of Transportation (Medical): No   Lack of Transportation (Non-Medical): No  Physical Activity: Not on file  Stress: Not on file  Social Connections: Not on file  Intimate Partner Violence: Not on file    Outpatient Medications Prior to Visit  Medication Sig Dispense Refill   albuterol (PROVENTIL HFA;VENTOLIN HFA) 108 (90 Base) MCG/ACT inhaler Inhale two puffs every four to six hours as needed for cough or wheeze. 1 Inhaler 1   Ascorbic Acid (VITAMIN C) 1000 MG tablet Take 1,000 mg by mouth daily.     Ca & Phos-Vit D-Mag (CALCIUM) [phone] TABS Take by mouth.     Calcium Carb-Cholecalciferol (CALCIUM + D3) 600-200 MG-UNIT TABS Take 1 tablet by mouth 2 (  two) times daily.     celecoxib (CELEBREX) 100 MG capsule Take 1 capsule (100 mg total) by mouth 2 (two) times daily. 180 capsule 2   Cholecalciferol (VITAMIN D3) 250 MCG (10000 UT) TABS Take by mouth.     estradiol (ESTRACE) 0.1 MG/GM vaginal cream Place 1 Applicatorful vaginally at bedtime.     famotidine (PEPCID) 40 MG tablet Take 1 tablet (40 mg total) by mouth 2 (two) times daily. 180 tablet 1   fluticasone (FLONASE) 50 MCG/ACT nasal spray Can use one spray in each nostril once daily as directed. 16 g 0   Hypromellose (ARTIFICIAL TEARS OP) Apply 1 drop to eye as needed.     loratadine (CLARITIN) 10 MG tablet Take 10 mg by mouth daily.     Multiple Vitamins-Minerals  (HAIR SKIN AND NAILS FORMULA) TABS Take by mouth.     Multiple Vitamins-Minerals (MULTIVITAMIN PO) Take 1 tablet by mouth daily.     mupirocin ointment (BACTROBAN) 2 % APP SML AMT EXT AA TID     ondansetron (ZOFRAN ODT) 4 MG disintegrating tablet Take 1 tablet (4 mg total) by mouth every 8 (eight) hours as needed for nausea or vomiting. 20 tablet 0   simvastatin (ZOCOR) 40 MG tablet TAKE 1 TABLET(40 MG) BY MOUTH DAILY 90 tablet 2   SYMBICORT 80-4.5 MCG/ACT inhaler INHALE 2 PUFFS INTO LUNGS TWICE DAILY, IN THE MORNING AND EVENING 30.6 g 6   tamsulosin (FLOMAX) 0.4 MG CAPS capsule Take 0.4 mg by mouth daily.     VITAMIN E PO Take 1 capsule by mouth daily.     HYDROcodone-acetaminophen (NORCO/VICODIN) 5-325 MG tablet every 8 (eight) hours as needed.   0   predniSONE (STERAPRED UNI-PAK 21 TAB) 10 MG (21) TBPK tablet Take 6ills first day , then 5 pills day 2 and then cut down one pill day until gone 21 tablet 0   No facility-administered medications prior to visit.    Allergies  Allergen Reactions   Tetanus Toxoids Swelling and Other (See Comments)    Swelling around throat and jaws   Bee Venom Swelling and Other (See Comments)    Crusty area on skin   Hornet Venom Swelling    Other reaction(s): Other (See Comments) Crusty area on skin   Dilaudid [Hydromorphone] Nausea And Vomiting    Causes nausea and vomiting   Erythromycin Nausea And Vomiting   Fiorinal [Butalbital-Aspirin-Caffeine]     Altered mental status   Amoxicillin Other (See Comments)    Upset stomach   Doxycycline Other (See Comments)    Upset stomach   Latex Other (See Comments)    Skin will crack open and bleed   Neomycin Other (See Comments)    Irritates skin   Penicillins Itching, Swelling and Rash    ROS Review of Systems  Constitutional:  Negative for activity change and appetite change.  HENT:  Negative for congestion.   Eyes:  Negative for visual disturbance.  Respiratory:  Negative for chest tightness and  shortness of breath.   Cardiovascular:  Negative for chest pain, palpitations and leg swelling.  Gastrointestinal:  Negative for abdominal distention and abdominal pain.  Endocrine: Negative for polyuria.  Genitourinary:  Negative for difficulty urinating and dysuria.  Musculoskeletal:  Negative for arthralgias and back pain.  Skin:  Positive for rash (scaling on face).  Neurological: Negative.   Psychiatric/Behavioral: Negative.       Objective:    Physical Exam Vitals reviewed.  Constitutional:  Appearance: Normal appearance.  HENT:     Head: Normocephalic and atraumatic.     Right Ear: Tympanic membrane, ear canal and external ear normal.     Left Ear: Tympanic membrane, ear canal and external ear normal.     Nose:     Comments: Rash eye brows and nasolabial folds    Mouth/Throat:     Mouth: Mucous membranes are moist.     Pharynx: Oropharynx is clear.  Eyes:     Conjunctiva/sclera: Conjunctivae normal.     Pupils: Pupils are equal, round, and reactive to light.  Cardiovascular:     Rate and Rhythm: Normal rate and regular rhythm.     Pulses: Normal pulses.     Heart sounds: Normal heart sounds. No murmur heard.   No gallop.  Pulmonary:     Effort: Pulmonary effort is normal. No respiratory distress.     Breath sounds: Normal breath sounds. No wheezing.  Musculoskeletal:     Cervical back: Normal range of motion and neck supple.  Skin:    Capillary Refill: Capillary refill takes less than 2 seconds.     Findings: Rash present.     Comments: Scaling nasolabial folds and eye brows  Neurological:     General: No focal deficit present.     Mental Status: She is alert and oriented to person, place, and time.  Psychiatric:        Mood and Affect: Mood normal.    BP 130/60   Pulse 81   Temp (!) 97.5 F (36.4 C)   Resp 16   Ht _0  (1.473 m)   Wt 142 lb (64.4 kg)   SpO2 96%   BMI 29.68 kg/m  Wt Readings from Last 3 Encounters:  12/21/20 142 lb (64.4 kg)   12/13/20 144 lb (65.3 kg)  10/05/20 147 lb 12.8 oz (67 kg)     Health Maintenance Due  Topic Date Due   Hepatitis C Screening  Never done   Zoster Vaccines- Shingrix (1 of 2) Never done   MAMMOGRAM  06/26/2019    There are no preventive care reminders to display for this patient.  No results found for: TSH Lab Results  Component Value Date   WBC 6.3 09/16/2020   HGB 12.2 09/16/2020   HCT 36.0 09/16/2020   MCV 91 09/16/2020   PLT 319 09/16/2020   Lab Results  Component Value Date   NA 141 09/16/2020   K 4.6 09/16/2020   CO2 22 09/16/2020   GLUCOSE 103 (H) 09/16/2020   BUN 22 09/16/2020   CREATININE 0.98 09/16/2020   BILITOT 0.3 09/16/2020   ALKPHOS 77 09/16/2020   AST 25 09/16/2020   ALT 17 09/16/2020   PROT 6.7 09/16/2020   ALBUMIN 4.5 09/16/2020   CALCIUM 10.4 (H) 09/16/2020   ANIONGAP 7 01/12/2015   EGFR 59 (L) 09/16/2020   Lab Results  Component Value Date   CHOL 172 09/16/2020   Lab Results  Component Value Date   HDL 84 09/16/2020   Lab Results  Component Value Date   LDLCALC 73 09/16/2020   Lab Results  Component Value Date   TRIG 83 09/16/2020   Lab Results  Component Value Date   CHOLHDL 2.0 09/16/2020   No results found for: HGBA1C    Assessment & Plan:   Problem List Items Addressed This Visit       Musculoskeletal and Integument   Seborrheic dermatitis   Relevant Medications   hydrocortisone 1 %  ointment Patient has seborrheic dermatitis on face, we discussed and use 1% hydrocortisone cream     Other   BMI 29.0-29.9,adult - Primary An individualize plan was formulated for obesity using patient history and physical exam to encourage weight loss.  An evidence based program was formulated.  Patient is to cut portion size with meals and to plan physical exercise 3 days a week at least 20 minutes.  Weight watchers and other programs are helpful.  Planned amount of weight loss 10 lbs.     Meds ordered this encounter  Medications    hydrocortisone 1 % ointment    Sig: Apply 1 application topically 2 (two) times daily.    Dispense:  30 g    Refill:  3    Follow-up: Return if symptoms worsen or fail to improve.    Reinaldo Meeker, MD

## 2020-12-22 ENCOUNTER — Ambulatory Visit: Payer: Medicare Other | Admitting: Legal Medicine

## 2020-12-24 ENCOUNTER — Other Ambulatory Visit: Payer: Self-pay | Admitting: Legal Medicine

## 2021-01-23 ENCOUNTER — Other Ambulatory Visit: Payer: Self-pay | Admitting: Legal Medicine

## 2021-01-23 DIAGNOSIS — E782 Mixed hyperlipidemia: Secondary | ICD-10-CM

## 2021-02-09 DIAGNOSIS — N3281 Overactive bladder: Secondary | ICD-10-CM | POA: Diagnosis not present

## 2021-02-09 DIAGNOSIS — R339 Retention of urine, unspecified: Secondary | ICD-10-CM | POA: Diagnosis not present

## 2021-02-24 ENCOUNTER — Other Ambulatory Visit: Payer: Self-pay

## 2021-02-24 ENCOUNTER — Encounter: Payer: Self-pay | Admitting: Legal Medicine

## 2021-02-24 ENCOUNTER — Ambulatory Visit (INDEPENDENT_AMBULATORY_CARE_PROVIDER_SITE_OTHER): Payer: Medicare Other | Admitting: Legal Medicine

## 2021-02-24 VITALS — BP 130/80 | HR 75 | Temp 97.2°F | Resp 15 | Ht <= 58 in | Wt 151.0 lb

## 2021-02-24 DIAGNOSIS — R55 Syncope and collapse: Secondary | ICD-10-CM

## 2021-02-24 DIAGNOSIS — F341 Dysthymic disorder: Secondary | ICD-10-CM | POA: Diagnosis not present

## 2021-02-24 DIAGNOSIS — Z6829 Body mass index (BMI) 29.0-29.9, adult: Secondary | ICD-10-CM | POA: Diagnosis not present

## 2021-02-24 DIAGNOSIS — I10 Essential (primary) hypertension: Secondary | ICD-10-CM | POA: Diagnosis not present

## 2021-02-24 LAB — CBC WITH DIFFERENTIAL/PLATELET
Basophils Absolute: 0.1 10*3/uL (ref 0.0–0.2)
Basos: 1 %
EOS (ABSOLUTE): 0.4 10*3/uL (ref 0.0–0.4)
Eos: 5 %
Hematocrit: 33.9 % — ABNORMAL LOW (ref 34.0–46.6)
Hemoglobin: 11.4 g/dL (ref 11.1–15.9)
Immature Grans (Abs): 0 10*3/uL (ref 0.0–0.1)
Immature Granulocytes: 0 %
Lymphocytes Absolute: 1.6 10*3/uL (ref 0.7–3.1)
Lymphs: 21 %
MCH: 29.8 pg (ref 26.6–33.0)
MCHC: 33.6 g/dL (ref 31.5–35.7)
MCV: 89 fL (ref 79–97)
Monocytes Absolute: 0.7 10*3/uL (ref 0.1–0.9)
Monocytes: 8 %
Neutrophils Absolute: 5.3 10*3/uL (ref 1.4–7.0)
Neutrophils: 65 %
Platelets: 324 10*3/uL (ref 150–450)
RBC: 3.82 x10E6/uL (ref 3.77–5.28)
RDW: 13.3 % (ref 11.7–15.4)
WBC: 8 10*3/uL (ref 3.4–10.8)

## 2021-02-24 LAB — COMPREHENSIVE METABOLIC PANEL
ALT: 16 IU/L (ref 0–32)
AST: 20 IU/L (ref 0–40)
Albumin/Globulin Ratio: 2.1 (ref 1.2–2.2)
Albumin: 4.4 g/dL (ref 3.7–4.7)
Alkaline Phosphatase: 68 IU/L (ref 44–121)
BUN/Creatinine Ratio: 23 (ref 12–28)
BUN: 22 mg/dL (ref 8–27)
Bilirubin Total: 0.2 mg/dL (ref 0.0–1.2)
CO2: 23 mmol/L (ref 20–29)
Calcium: 9.3 mg/dL (ref 8.7–10.3)
Chloride: 101 mmol/L (ref 96–106)
Creatinine, Ser: 0.95 mg/dL (ref 0.57–1.00)
Globulin, Total: 2.1 g/dL (ref 1.5–4.5)
Glucose: 100 mg/dL — ABNORMAL HIGH (ref 65–99)
Potassium: 4 mmol/L (ref 3.5–5.2)
Sodium: 141 mmol/L (ref 134–144)
Total Protein: 6.5 g/dL (ref 6.0–8.5)
eGFR: 61 mL/min/{1.73_m2} (ref >59)

## 2021-02-24 NOTE — Progress Notes (Signed)
Acute Office Visit  Subjective:    Patient ID: Shannon Martin, female    DOB: Jul 08, 1940, 80 y.o.   MRN: [id number]  Chief Complaint  Patient presents with   Dizziness   Gastroesophageal Reflux    HPI Patient is in today for dizzy spells, wakes up this way and unstable gait. She is measuring BP up and down.  She was in line at checkout and had dizziness, she had syncope, no tonic/clonic movements, paramedics called, no incontinence and no post ictal changes. She notes tachycardia periodically that resolves.  She is doing well with her asthma medicines  She uses Excedrin for headaches, she is on ec ASA.  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    Past Surgical History:  Procedure Laterality Date   BREAST BIOPSY Left ~ [place], BILATERAL Bilateral 2003   CHEST TUBE INSERTION  January 2015   X 2 at [place]  4-5   JOINT REPLACEMENT     REPLACEMENT TOTAL KNEE Left    TONSILLECTOMY AND ADENOIDECTOMY  1946; 1947   TOTAL KNEE ARTHROPLASTY Left 01/11/2015   TOTAL KNEE ARTHROPLASTY Left 01/11/2015   Procedure: Left TOTAL KNEE ARTHROPLASTY;  Surgeon: Vickey Huger, MD;  Location: [facility];  Service: Orthopedics;  Laterality: Left;   VIDEO ASSISTED THORACOSCOPY (VATS)/THOROCOTOMY Left 07/13/2013   VATS with insertion of chest tubes    History reviewed. No pertinent family history.  Social History   Socioeconomic History   Marital  status: Widowed    Spouse name: Not on file   Number of children: 1   Years of education: Not on file   Highest education level: Not on file  Occupational History   Not on file  Tobacco Use   Smoking status: Never   Smokeless tobacco: Never  Vaping Use   Vaping Use: Never used  Substance and Sexual Activity   Alcohol use: Yes    Alcohol/week: 2.0 standard drinks    Types: 2 Glasses of wine per week   Drug use: No   Sexual activity: Not Currently  Other Topics Concern   Not on file  Social History Narrative   Not on file   Social Determinants of Health   Financial Resource Strain: Not on file  Food Insecurity: No Food Insecurity   Worried About Running Out of Food in the Last Year: Never true   Ran Out of Food in the Last Year: Never true  Transportation Needs: No Transportation Needs   Lack of Transportation (Medical): No   Lack of Transportation (Non-Medical): No  Physical Activity: Not on file  Stress: Not on file  Social Connections: Not on file  Intimate Partner Violence: Not on file    Outpatient Medications Prior to Visit  Medication Sig Dispense Refill   albuterol (PROVENTIL HFA;VENTOLIN HFA) 108 (90 Base) MCG/ACT inhaler Inhale two puffs every four to six hours as needed for cough or wheeze. 1 Inhaler 1  Ascorbic Acid (VITAMIN C) 1000 MG tablet Take 1,000 mg by mouth daily.     Ca & Phos-Vit D-Mag (CALCIUM) [phone] TABS Take by mouth.     Calcium Carb-Cholecalciferol (CALCIUM + D3) 600-200 MG-UNIT TABS Take 1 tablet by mouth 2 (two) times daily.     celecoxib (CELEBREX) 100 MG capsule Take 1 capsule (100 mg total) by mouth 2 (two) times daily. 180 capsule 2   Cholecalciferol (VITAMIN D3) 250 MCG (10000 UT) TABS Take by mouth.     estradiol (ESTRACE) 0.1 MG/GM vaginal cream Place 1 Applicatorful vaginally at bedtime.     famotidine (PEPCID) 40 MG tablet TAKE 1 TABLET(40 MG) BY MOUTH TWICE DAILY 180 tablet 1   fluticasone (FLONASE) 50 MCG/ACT nasal spray  Can use one spray in each nostril once daily as directed. 16 g 0   hydrocortisone 1 % ointment Apply 1 application topically 2 (two) times daily. 30 g 3   Hypromellose (ARTIFICIAL TEARS OP) Apply 1 drop to eye as needed.     loratadine (CLARITIN) 10 MG tablet Take 10 mg by mouth daily.     Multiple Vitamins-Minerals (HAIR SKIN AND NAILS FORMULA) TABS Take by mouth.     Multiple Vitamins-Minerals (MULTIVITAMIN PO) Take 1 tablet by mouth daily.     mupirocin ointment (BACTROBAN) 2 % APP SML AMT EXT AA TID     ondansetron (ZOFRAN ODT) 4 MG disintegrating tablet Take 1 tablet (4 mg total) by mouth every 8 (eight) hours as needed for nausea or vomiting. 20 tablet 0   Potassium 95 MG TABS Take by mouth.     simvastatin (ZOCOR) 40 MG tablet TAKE 1 TABLET(40 MG) BY MOUTH DAILY 90 tablet 2   SYMBICORT 80-4.5 MCG/ACT inhaler INHALE 2 PUFFS INTO LUNGS TWICE DAILY, IN THE MORNING AND EVENING 30.6 g 6   tamsulosin (FLOMAX) 0.4 MG CAPS capsule Take 0.4 mg by mouth daily.     VITAMIN E PO Take 1 capsule by mouth daily.     No facility-administered medications prior to visit.    Allergies  Allergen Reactions   Tetanus Toxoids Swelling and Other (See Comments)    Swelling around throat and jaws   Bee Venom Swelling and Other (See Comments)    Crusty area on skin   Hornet Venom Swelling    Other reaction(s): Other (See Comments) Crusty area on skin   Dilaudid [Hydromorphone] Nausea And Vomiting    Causes nausea and vomiting   Erythromycin Nausea And Vomiting   Fiorinal [Butalbital-Aspirin-Caffeine]     Altered mental status   Amoxicillin Other (See Comments)    Upset stomach   Doxycycline Other (See Comments)    Upset stomach   Latex Other (See Comments)    Skin will crack open and bleed   Neomycin Other (See Comments)    Irritates skin   Penicillins Itching, Swelling and Rash    Review of Systems  Constitutional:  Negative for activity change and appetite change.  HENT:  Positive for  congestion.   Respiratory:  Negative for chest tightness and shortness of breath.   Cardiovascular:  Positive for palpitations. Negative for chest pain.  Gastrointestinal:  Negative for abdominal distention and abdominal pain.  Genitourinary:  Positive for dysuria. Negative for difficulty urinating.  Musculoskeletal:  Negative for arthralgias and back pain.  Neurological:  Positive for light-headedness.  Psychiatric/Behavioral: Negative.        Objective:    Physical Exam Vitals reviewed.  Constitutional:  General: She is in acute distress.     Appearance: Normal appearance.  HENT:     Right Ear: Tympanic membrane normal.     Left Ear: Tympanic membrane normal.     Mouth/Throat:     Mouth: Mucous membranes are moist.     Pharynx: Oropharynx is clear.  Eyes:     Extraocular Movements: Extraocular movements intact.     Conjunctiva/sclera: Conjunctivae normal.     Pupils: Pupils are equal, round, and reactive to light.  Cardiovascular:     Rate and Rhythm: Normal rate and regular rhythm.     Pulses: Normal pulses.     Heart sounds: Normal heart sounds. No murmur heard.   No gallop.  Pulmonary:     Effort: Pulmonary effort is normal. No respiratory distress.     Breath sounds: Normal breath sounds. No wheezing.  Abdominal:     General: Abdomen is flat. Bowel sounds are normal. There is no distension.     Palpations: Abdomen is soft.     Tenderness: There is no abdominal tenderness.  Musculoskeletal:        General: Normal range of motion.     Cervical back: Normal range of motion.     Right lower leg: No edema.     Left lower leg: No edema.  Skin:    General: Skin is warm.     Capillary Refill: Capillary refill takes less than 2 seconds.  Neurological:     General: No focal deficit present.     Mental Status: She is alert and oriented to person, place, and time. Mental status is at baseline.  Psychiatric:        Mood and Affect: Mood normal.        Thought Content:  Thought content normal.        Judgment: Judgment normal.    BP 130/80   Pulse 75   Temp (!) 97.2 F (36.2 C)   Resp 15   Ht _0  (1.473 m)   Wt 151 lb (68.5 kg)   SpO2 98%   BMI 31.56 kg/m  Wt Readings from Last 3 Encounters:  02/24/21 151 lb (68.5 kg)  12/21/20 142 lb (64.4 kg)  12/13/20 144 lb (65.3 kg)    Health Maintenance Due  Topic Date Due   Hepatitis C Screening  Never done   Zoster Vaccines- Shingrix (1 of 2) Never done   MAMMOGRAM  06/26/2019   INFLUENZA VACCINE  01/24/2021   COVID-19 Vaccine (5 - Booster for [place] series) 02/17/2021    There are no preventive care reminders to display for this patient.   No results found for: TSH Lab Results  Component Value Date   WBC 6.3 09/16/2020   HGB 12.2 09/16/2020   HCT 36.0 09/16/2020   MCV 91 09/16/2020   PLT 319 09/16/2020   Lab Results  Component Value Date   NA 141 09/16/2020   K 4.6 09/16/2020   CO2 22 09/16/2020   GLUCOSE 103 (H) 09/16/2020   BUN 22 09/16/2020   CREATININE 0.98 09/16/2020   BILITOT 0.3 09/16/2020   ALKPHOS 77 09/16/2020   AST 25 09/16/2020   ALT 17 09/16/2020   PROT 6.7 09/16/2020   ALBUMIN 4.5 09/16/2020   CALCIUM 10.4 (H) 09/16/2020   ANIONGAP 7 01/12/2015   EGFR 59 (L) 09/16/2020   Lab Results  Component Value Date   CHOL 172 09/16/2020   Lab Results  Component Value Date   HDL 84 09/16/2020  Lab Results  Component Value Date   LDLCALC 73 09/16/2020   Lab Results  Component Value Date   TRIG 83 09/16/2020   Lab Results  Component Value Date   CHOLHDL 2.0 09/16/2020   No results found for: HGBA1C     Assessment & Plan:  Diagnoses and all orders for this visit: Dysthymia -     Ambulatory referral to Cardiology -     EKG 12-Lead Patient is having tachycardia and weak spells, she had one syncopal spell with no seizure -like activity, she will need event monitoring  Essential hypertension -     CBC with Differential/Platelet -     Comprehensive  metabolic panel An individual hypertension care plan was established and reinforced today.  The patient's status was assessed using clinical findings on exam and labs or diagnostic tests. The patient's success at meeting treatment goals on disease specific evidence-based guidelines and found to be well controlled. SELF MANAGEMENT: The patient and I together assessed ways to personally work towards obtaining the recommended goals. RECOMMENDATIONS: avoid decongestants found in common cold remedies, decrease consumption of alcohol, perform routine monitoring of BP with home BP cuff, exercise, reduction of dietary salt, take medicines as prescribed, try not to miss doses and quit smoking.  Regular exercise and maintaining a healthy weight is needed.  Stress reduction may help. A CLINICAL SUMMARY including written plan identify barriers to care unique to individual due to social or financial issues.  We attempt to mutually creat solutions for individual and family understanding.   Syncope and collapse Patient had syncopal spell in supermarket with no seizure like activity and no post ictal symptoms.  BMI 29.0-29.9,adult  An individualize plan was formulated for obesity using patient history and physical exam to encourage weight loss.  An evidence based program was formulated.  Patient is to cut portion size with meals and to plan physical exercise 3 days a week at least 20 minutes.  Weight watchers and other programs are helpful.  Planned amount of weight loss 10 lbs.        I spent 30 minutes dedicated to the care of this patient on the date of this encounter to include face-to-face time with the patient, as well as: records review  Follow-up: Return in about 3 months (around 05/26/2021).  An After Visit Summary was printed and given to the patient.  Reinaldo Meeker, MD [facility] [phone]

## 2021-02-25 NOTE — Progress Notes (Signed)
Cbc ok, glucose 100, kidney tests normal, calcium normal, liver tests normal,  lp

## 2021-03-18 ENCOUNTER — Encounter: Payer: Self-pay | Admitting: Legal Medicine

## 2021-03-18 ENCOUNTER — Other Ambulatory Visit: Payer: Self-pay

## 2021-03-18 ENCOUNTER — Ambulatory Visit (INDEPENDENT_AMBULATORY_CARE_PROVIDER_SITE_OTHER): Payer: Medicare Other | Admitting: Legal Medicine

## 2021-03-18 VITALS — BP 136/60 | HR 69 | Temp 97.6°F | Resp 15 | Ht <= 58 in | Wt 150.0 lb

## 2021-03-18 DIAGNOSIS — E559 Vitamin D deficiency, unspecified: Secondary | ICD-10-CM

## 2021-03-18 DIAGNOSIS — K219 Gastro-esophageal reflux disease without esophagitis: Secondary | ICD-10-CM

## 2021-03-18 DIAGNOSIS — I10 Essential (primary) hypertension: Secondary | ICD-10-CM | POA: Diagnosis not present

## 2021-03-18 DIAGNOSIS — E782 Mixed hyperlipidemia: Secondary | ICD-10-CM

## 2021-03-18 DIAGNOSIS — J4540 Moderate persistent asthma, uncomplicated: Secondary | ICD-10-CM | POA: Diagnosis not present

## 2021-03-18 DIAGNOSIS — J3089 Other allergic rhinitis: Secondary | ICD-10-CM

## 2021-03-18 MED ORDER — BUDESONIDE-FORMOTEROL FUMARATE 80-4.5 MCG/ACT IN AERO: INHALATION_SPRAY | RESPIRATORY_TRACT | 6 refills | Status: DC

## 2021-03-18 MED ORDER — FLUTICASONE PROPIONATE 50 MCG/ACT NA SUSP: NASAL | 6 refills | Status: DC

## 2021-03-18 NOTE — Progress Notes (Signed)
Established Patient Office Visit  Subjective:  Patient ID: Shannon Martin, female    DOB: 09-22-1940  Age: 80 y.o. MRN: [id number]  CC:  Chief Complaint  Patient presents with   Hyperlipidemia   Hypertension   Asthma    HPI Shannon Martin presents for chronic visit  Patient presents for follow up of hypertension.  Patient tolerating none well with side effects.  Patient was diagnosed with hypertension 2010 so has been treated for hypertension for 10 years.Patient is working on maintaining diet and exercise regimen and follows up as directed. Complication include none.   Patient presents with hyperlipidemia.  Compliance with treatment has been good; patient takes medicines as directed, maintains low cholesterol diet, follows up as directed, and maintains exercise regimen.  Patient is using sivastatin without problems.   Patient has moderate intermittant asthma uncomplicated.  Asthma was diagnosed adult and has occasional daytime episodes and occasional night symptoms.  Patient is not having an attack and is using symbicort.  Patient does not smoke.  no   Gerd improved with zantac.  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    Past Surgical History:  Procedure Laterality Date   BREAST BIOPSY Left ~ [place], BILATERAL Bilateral 2003   CHEST TUBE INSERTION  January 2015   X 2 at [place]  4-5   JOINT REPLACEMENT      REPLACEMENT TOTAL KNEE Left    TONSILLECTOMY AND ADENOIDECTOMY  1946; 1947   TOTAL KNEE ARTHROPLASTY Left 01/11/2015   TOTAL KNEE ARTHROPLASTY Left 01/11/2015   Procedure: Left TOTAL KNEE ARTHROPLASTY;  Surgeon: Vickey Huger, MD;  Location: [facility];  Service: Orthopedics;  Laterality: Left;   VIDEO ASSISTED THORACOSCOPY (VATS)/THOROCOTOMY Left 07/13/2013   VATS with insertion of chest tubes    History reviewed. No pertinent family history.  Social History   Socioeconomic History   Marital status: Widowed    Spouse name: Not on file   Number of children: 1   Years of education: Not on file   Highest education level: Not on file  Occupational History   Not on file  Tobacco Use   Smoking status: Never   Smokeless tobacco: Never  Vaping Use   Vaping Use: Never used  Substance and Sexual Activity   Alcohol use: Yes    Alcohol/week: 2.0 standard drinks    Types: 2 Glasses of wine per week   Drug use: No   Sexual activity: Not Currently  Other Topics Concern   Not on file  Social History Narrative   Not on file   Social Determinants of Health   Financial Resource Strain: Not on file  Food Insecurity: No Food Insecurity   Worried About Running Out of Food in the Last Year: Never true   Ran Out of Food in the Last Year: Never true  Transportation Needs: No Transportation Needs   Lack of Transportation (Medical): No   Lack of  Transportation (Non-Medical): No  Physical Activity: Not on file  Stress: Not on file  Social Connections: Not on file  Intimate Partner Violence: Not on file    Outpatient Medications Prior to Visit  Medication Sig Dispense Refill   albuterol (PROVENTIL HFA;VENTOLIN HFA) 108 (90 Base) MCG/ACT inhaler Inhale two puffs every four to six hours as needed for cough or wheeze. 1 Inhaler 1   Ascorbic Acid (VITAMIN C) 1000 MG tablet Take 1,000 mg by mouth daily.     Ca & Phos-Vit D-Mag (CALCIUM) [phone] TABS Take by mouth.     Calcium  Carb-Cholecalciferol (CALCIUM + D3) 600-200 MG-UNIT TABS Take 1 tablet by mouth 2 (two) times daily.     celecoxib (CELEBREX) 100 MG capsule Take 1 capsule (100 mg total) by mouth 2 (two) times daily. 180 capsule 2   Cholecalciferol (VITAMIN D3) 250 MCG (10000 UT) TABS Take by mouth.     estradiol (ESTRACE) 0.1 MG/GM vaginal cream Place 1 Applicatorful vaginally at bedtime.     famotidine (PEPCID) 40 MG tablet TAKE 1 TABLET(40 MG) BY MOUTH TWICE DAILY 180 tablet 1   hydrocortisone 1 % ointment Apply 1 application topically 2 (two) times daily. 30 g 3   Hypromellose (ARTIFICIAL TEARS OP) Apply 1 drop to eye as needed.     loratadine (CLARITIN) 10 MG tablet Take 10 mg by mouth daily.     Multiple Vitamins-Minerals (HAIR SKIN AND NAILS FORMULA) TABS Take by mouth.     Multiple Vitamins-Minerals (MULTIVITAMIN PO) Take 1 tablet by mouth daily.     mupirocin ointment (BACTROBAN) 2 % APP SML AMT EXT AA TID     ondansetron (ZOFRAN ODT) 4 MG disintegrating tablet Take 1 tablet (4 mg total) by mouth every 8 (eight) hours as needed for nausea or vomiting. 20 tablet 0   Potassium 95 MG TABS Take by mouth.     simvastatin (ZOCOR) 40 MG tablet TAKE 1 TABLET(40 MG) BY MOUTH DAILY 90 tablet 2   tamsulosin (FLOMAX) 0.4 MG CAPS capsule Take 0.4 mg by mouth daily.     VITAMIN E PO Take 1 capsule by mouth daily.     fluticasone (FLONASE) 50 MCG/ACT nasal spray Can use one spray in each nostril once daily as directed. 16 g 0   SYMBICORT 80-4.5 MCG/ACT inhaler INHALE 2 PUFFS INTO LUNGS TWICE DAILY, IN THE MORNING AND EVENING 30.6 g 6   No facility-administered medications prior to visit.    Allergies  Allergen Reactions   Tetanus Toxoids Swelling and Other (See Comments)    Swelling around throat and jaws   Bee Venom Swelling and Other (See Comments)    Crusty area on skin   Hornet Venom Swelling    Other reaction(s): Other (See Comments) Crusty area on skin   Dilaudid [Hydromorphone] Nausea And Vomiting     Causes nausea and vomiting   Erythromycin Nausea And Vomiting   Fiorinal [Butalbital-Aspirin-Caffeine]     Altered mental status   Amoxicillin Other (See Comments)    Upset stomach   Doxycycline Other (See Comments)    Upset stomach   Latex Other (See Comments)    Skin will crack open and bleed   Neomycin Other (See Comments)    Irritates skin   Penicillins Itching, Swelling and Rash    ROS Review of Systems  Constitutional:  Negative for activity change and appetite change.  HENT:  Negative for congestion.   Eyes:  Negative for visual disturbance.  Respiratory:  Negative  for chest tightness and shortness of breath.   Cardiovascular:  Negative for chest pain and palpitations.  Gastrointestinal:  Negative for abdominal distention and abdominal pain.  Genitourinary:  Negative for difficulty urinating and dysuria.  Musculoskeletal:  Negative for arthralgias and back pain.  Neurological: Negative.   Psychiatric/Behavioral: Negative.       Objective:    Physical Exam Vitals reviewed.  Constitutional:      General: She is not in acute distress.    Appearance: Normal appearance.  HENT:     Head: Normocephalic.     Right Ear: Tympanic membrane, ear canal and external ear normal.     Left Ear: Tympanic membrane, ear canal and external ear normal.     Mouth/Throat:     Mouth: Mucous membranes are moist.     Pharynx: Oropharynx is clear.  Eyes:     Extraocular Movements: Extraocular movements intact.     Conjunctiva/sclera: Conjunctivae normal.     Pupils: Pupils are equal, round, and reactive to light.  Cardiovascular:     Rate and Rhythm: Normal rate and regular rhythm.     Pulses: Normal pulses.     Heart sounds: Normal heart sounds. No murmur heard.   No gallop.  Pulmonary:     Effort: Pulmonary effort is normal. No respiratory distress.     Breath sounds: Normal breath sounds. No wheezing.  Abdominal:     General: Abdomen is flat. Bowel sounds are normal. There is  no distension.     Palpations: Abdomen is soft.     Tenderness: There is no abdominal tenderness.  Musculoskeletal:        General: Normal range of motion.     Cervical back: Normal range of motion and neck supple.     Right lower leg: No edema.     Left lower leg: No edema.  Skin:    General: Skin is warm.     Capillary Refill: Capillary refill takes less than 2 seconds.  Neurological:     General: No focal deficit present.     Mental Status: She is alert and oriented to person, place, and time. Mental status is at baseline.  Psychiatric:        Mood and Affect: Mood normal.        Thought Content: Thought content normal.    BP 136/60   Pulse 69   Temp 97.6 F (36.4 C)   Resp 15   Ht '4\' 10"'  (1.473 m)   Wt 150 lb (68 kg)   SpO2 98%   BMI 31.35 kg/m  Wt Readings from Last 3 Encounters:  03/18/21 150 lb (68 kg)  02/24/21 151 lb (68.5 kg)  12/21/20 142 lb (64.4 kg)     Health Maintenance Due  Topic Date Due   Hepatitis C Screening  Never done   Zoster Vaccines- Shingrix (1 of 2) Never done   MAMMOGRAM  06/26/2019   INFLUENZA VACCINE  01/24/2021   COVID-19 Vaccine (5 - Booster for [place] series) 02/17/2021    There are no preventive care reminders to display for this patient.  No results found for: TSH Lab Results  Component Value Date   WBC 6.8 03/18/2021   HGB 11.9 03/18/2021   HCT 36.5 03/18/2021   MCV 89 03/18/2021   PLT 348 03/18/2021   Lab Results  Component Value Date   NA 141 03/18/2021   K 4.6 03/18/2021   CO2 21 03/18/2021   GLUCOSE 98 03/18/2021   BUN  20 03/18/2021   CREATININE 1.00 03/18/2021   BILITOT 0.3 03/18/2021   ALKPHOS 70 03/18/2021   AST 24 03/18/2021   ALT 16 03/18/2021   PROT 6.6 03/18/2021   ALBUMIN 4.7 03/18/2021   CALCIUM 10.3 03/18/2021   ANIONGAP 7 01/12/2015   EGFR 57 (L) 03/18/2021   Lab Results  Component Value Date   CHOL 193 03/18/2021   Lab Results  Component Value Date   HDL 72 03/18/2021   Lab Results   Component Value Date   LDLCALC 99 03/18/2021   Lab Results  Component Value Date   TRIG 127 03/18/2021   Lab Results  Component Value Date   CHOLHDL 2.7 03/18/2021   No results found for: HGBA1C    Assessment & Plan:   Problem List Items Addressed This Visit       Cardiovascular and Mediastinum   Essential hypertension - Primary   Relevant Orders   CBC with Differential/Platelet (Completed)   Comprehensive metabolic panel (Completed) An individual hypertension care plan was established and reinforced today.  The patient's status was assessed using clinical findings on exam and labs or diagnostic tests. The patient's success at meeting treatment goals on disease specific evidence-based guidelines and found to be well controlled. SELF MANAGEMENT: The patient and I together assessed ways to personally work towards obtaining the recommended goals. RECOMMENDATIONS: avoid decongestants found in common cold remedies, decrease consumption of alcohol, perform routine monitoring of BP with home BP cuff, exercise, reduction of dietary salt, take medicines as prescribed, try not to miss doses and quit smoking.  Regular exercise and maintaining a healthy weight is needed.  Stress reduction may help. A CLINICAL SUMMARY including written plan identify barriers to care unique to individual due to social or financial issues.  We attempt to mutually creat solutions for individual and family understanding.      Respiratory   Allergic rhinitis   Relevant Medications   fluticasone (FLONASE) 50 MCG/ACT nasal spray AN INDIVIDUAL CARE PLAN allergies was established and reinforced today.  The patient's status was assessed using clinical findings on exam, labs, and other diagnostic testing. Patient's success at meeting treatment goals based on disease specific evidence-bassed guidelines and found to be in poor control. RECOMMENDATIONS include start flonase.     Moderate persistent asthma   Relevant  Medications   budesonide-formoterol (SYMBICORT) 80-4.5 MCG/ACT inhaler This patient has asthma moderate and is on symbicort and albuterol.  Patient ia not having a flair.  Chronic medicines include symbicort. Addition new medicines none.  Asthma action plan is in place.       Digestive   GERD (gastroesophageal reflux disease) Plan of care was formulated today.  he is doing well.  A plan of care was formulated using patient exam, tests and other sources to optimize care using evidence based information.  Recommend no smoking, no eating after supper, avoid fatty foods, elevate Head of bed, avoid tight fitting clothing.  Continue on OTC.      Other   Mixed hyperlipidemia (Chronic)   Relevant Orders   Lipid panel (Completed) AN INDIVIDUAL CARE PLAN for hyperlipidemia/ cholesterol was established and reinforced today.  The patient's status was assessed using clinical findings on exam, lab and other diagnostic tests. The patient's disease status was assessed based on evidence-based guidelines and found to be fair controlled. MEDICATIONS were reviewed. SELF MANAGEMENT GOALS have been discussed and patient's success at attaining the goal of low cholesterol was assessed. RECOMMENDATION given include regular exercise 3 days  a week and low cholesterol/low fat diet. CLINICAL SUMMARY including written plan to identify barriers unique to the patient due to social or economic  reasons was discussed.    Other Visit Diagnoses     Vitamin D deficiency     AN INDIVIDUAL CARE PLAN for vitamin D deficiency was established and reinforced today.  The patient's status was assessed using clinical findings on exam, labs, and other diagnostic testing. Patient's success at meeting treatment goals based on disease specific evidence-bassed guidelines and found to be in good control. RECOMMENDATIONS include maintaining present medicines and treatment.        Meds ordered this encounter  Medications    budesonide-formoterol (SYMBICORT) 80-4.5 MCG/ACT inhaler    Sig: INHALE 2 PUFFS INTO LUNGS TWICE DAILY, IN THE MORNING AND EVENING    Dispense:  30.6 g    Refill:  6   fluticasone (FLONASE) 50 MCG/ACT nasal spray    Sig: Can use one spray in each nostril once daily as directed.    Dispense:  16 g    Refill:  6    Please inform patient that she needs an appointment.  One courtesy refill.  Thank you- HMK    Follow-up: Return in about 6 months (around 09/15/2021) for fasting.    Reinaldo Meeker, MD

## 2021-03-19 LAB — CBC WITH DIFFERENTIAL/PLATELET
Basophils Absolute: 0.1 10*3/uL (ref 0.0–0.2)
Basos: 1 %
EOS (ABSOLUTE): 0.3 10*3/uL (ref 0.0–0.4)
Eos: 4 %
Hematocrit: 36.5 % (ref 34.0–46.6)
Hemoglobin: 11.9 g/dL (ref 11.1–15.9)
Immature Grans (Abs): 0 10*3/uL (ref 0.0–0.1)
Immature Granulocytes: 0 %
Lymphocytes Absolute: 2.1 10*3/uL (ref 0.7–3.1)
Lymphs: 31 %
MCH: 29.1 pg (ref 26.6–33.0)
MCHC: 32.6 g/dL (ref 31.5–35.7)
MCV: 89 fL (ref 79–97)
Monocytes Absolute: 0.5 10*3/uL (ref 0.1–0.9)
Monocytes: 7 %
Neutrophils Absolute: 3.8 10*3/uL (ref 1.4–7.0)
Neutrophils: 57 %
Platelets: 348 10*3/uL (ref 150–450)
RBC: 4.09 x10E6/uL (ref 3.77–5.28)
RDW: 13.1 % (ref 11.7–15.4)
WBC: 6.8 10*3/uL (ref 3.4–10.8)

## 2021-03-19 LAB — COMPREHENSIVE METABOLIC PANEL
ALT: 16 IU/L (ref 0–32)
AST: 24 IU/L (ref 0–40)
Albumin/Globulin Ratio: 2.5 — ABNORMAL HIGH (ref 1.2–2.2)
Albumin: 4.7 g/dL (ref 3.7–4.7)
Alkaline Phosphatase: 70 IU/L (ref 44–121)
BUN/Creatinine Ratio: 20 (ref 12–28)
BUN: 20 mg/dL (ref 8–27)
Bilirubin Total: 0.3 mg/dL (ref 0.0–1.2)
CO2: 21 mmol/L (ref 20–29)
Calcium: 10.3 mg/dL (ref 8.7–10.3)
Chloride: 104 mmol/L (ref 96–106)
Creatinine, Ser: 1 mg/dL (ref 0.57–1.00)
Globulin, Total: 1.9 g/dL (ref 1.5–4.5)
Glucose: 98 mg/dL (ref 65–99)
Potassium: 4.6 mmol/L (ref 3.5–5.2)
Sodium: 141 mmol/L (ref 134–144)
Total Protein: 6.6 g/dL (ref 6.0–8.5)
eGFR: 57 mL/min/{1.73_m2} — ABNORMAL LOW (ref >59)

## 2021-03-19 LAB — LIPID PANEL
Chol/HDL Ratio: 2.7 ratio (ref 0.0–4.4)
Cholesterol, Total: 193 mg/dL (ref 100–199)
HDL: 72 mg/dL (ref >39)
LDL Chol Calc (NIH): 99 mg/dL (ref 0–99)
Triglycerides: 127 mg/dL (ref 0–149)
VLDL Cholesterol Cal: 22 mg/dL (ref 5–40)

## 2021-03-19 LAB — CARDIOVASCULAR RISK ASSESSMENT

## 2021-04-05 ENCOUNTER — Other Ambulatory Visit: Payer: Self-pay

## 2021-04-05 DIAGNOSIS — G43909 Migraine, unspecified, not intractable, without status migrainosus: Secondary | ICD-10-CM | POA: Insufficient documentation

## 2021-04-05 DIAGNOSIS — N3281 Overactive bladder: Secondary | ICD-10-CM | POA: Insufficient documentation

## 2021-04-07 ENCOUNTER — Telehealth: Payer: Self-pay

## 2021-04-07 NOTE — Telephone Encounter (Signed)
Agree with plan. kc 

## 2021-04-07 NOTE — Telephone Encounter (Signed)
Shannon Martin called to make Dr.[name] aware that her episodes of feeling dizzy and short of breath are occurring more frequently.  She has been scheduled to see cardiology tomorrow and she is going to make them aware of her increasing symptoms.  She denies chest pain.  She is going to follow-up in our office if cardiology clears her.

## 2021-04-08 ENCOUNTER — Ambulatory Visit: Payer: Medicare Other | Admitting: Cardiology

## 2021-04-08 ENCOUNTER — Ambulatory Visit (INDEPENDENT_AMBULATORY_CARE_PROVIDER_SITE_OTHER): Payer: Medicare Other

## 2021-04-08 ENCOUNTER — Other Ambulatory Visit: Payer: Self-pay

## 2021-04-08 ENCOUNTER — Encounter: Payer: Self-pay | Admitting: Cardiology

## 2021-04-08 VITALS — BP 148/66 | HR 86 | Ht 59.0 in | Wt 152.2 lb

## 2021-04-08 DIAGNOSIS — E782 Mixed hyperlipidemia: Secondary | ICD-10-CM

## 2021-04-08 DIAGNOSIS — I10 Essential (primary) hypertension: Secondary | ICD-10-CM

## 2021-04-08 DIAGNOSIS — R011 Cardiac murmur, unspecified: Secondary | ICD-10-CM | POA: Diagnosis not present

## 2021-04-08 DIAGNOSIS — R002 Palpitations: Secondary | ICD-10-CM

## 2021-04-08 HISTORY — DX: Palpitations: R00.2

## 2021-04-08 NOTE — Progress Notes (Signed)
Cardiology Office Note:    Date:  04/08/2021   ID:  Shannon Martin, DOB 1941-03-27, MRN [id number]  PCP:  Abigail Miyamoto, MD  Cardiologist:  Garwin Brothers, MD   Referring MD: Abigail Miyamoto,*    ASSESSMENT:    1. Essential hypertension   2. Mixed hyperlipidemia   3. Palpitations    PLAN:    In order of problems listed above:  Primary prevention stressed with the patient.  Importance of compliance with diet medication stressed and she vocalized understanding.  She was advised to walk on a regular basis.  She tells me that she walks 20 to 25 minutes daily without any problems. Essential hypertension: Blood pressure stable and diet was emphasized.  Lifestyle modification urged.  Her blood pressures at home are fine. Palpitations: This appears significant and we will do a 2-week monitoring.  Her TSH was fine. Cardiac murmur: Echocardiogram will be done to assess murmur heard on auscultation. Mixed dyslipidemia: Diet emphasized.  Lipids reviewed.  Lifestyle modification urged. Patient will be seen in follow-up appointment in 2 months or earlier if the patient has any concerns    Medication Adjustments/Labs and Tests Ordered: Current medicines are reviewed at length with the patient today.  Concerns regarding medicines are outlined above.  Orders Placed This Encounter  Procedures   EKG 12-Lead   No orders of the defined types were placed in this encounter.    No chief complaint on file.    History of Present Illness:    Shannon Martin is a 80 y.o. female.  Patient has past medical history of essential hypertension and dyslipidemia.  She denies any problems at this time.  Activities of daily living.  She mentions to me that about once a week she has significant palpitations and feels tach.  No orthopnea or PND.  No syncopal episodes.  At the time of my evaluation, the patient is alert awake oriented and in no distress.  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   GERD (gastroesophageal reflux disease)    Iliotibial band syndrome of left side 06/17/2015   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   Laryngopharyngeal reflux (LPR) 03/24/2015   Left knee pain 10/01/2014   Migraine without aura and without status migrainosus, not intractable 07/28/2019   Migraines 1943 to present   "fairly frequently"; Takes Propranolol (01/12/2015)   Mixed hyperlipidemia 12/11/2018   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   Overactive bladder    Takes Vesicare   Primary osteoarthritis of left knee 12/03/2014   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   Seborrheic dermatitis 12/21/2020   Syncope and collapse 12/11/2018    Past Surgical History:  Procedure Laterality Date   BREAST BIOPSY Left ~ 1973   CATARACT EXTRACTION W/ INTRAOCULAR LENS  IMPLANT, BILATERAL Bilateral 2003   CHEST TUBE INSERTION  January 2015   X 2 at [facility]   DILATION AND CURETTAGE OF UTERUS  4-5   JOINT REPLACEMENT     REPLACEMENT TOTAL KNEE Left    TONSILLECTOMY AND ADENOIDECTOMY  1946; 1947   TOTAL KNEE ARTHROPLASTY Left 01/11/2015   TOTAL KNEE ARTHROPLASTY Left 01/11/2015   Procedure: Left TOTAL KNEE ARTHROPLASTY;  Surgeon: Dannielle Huh, MD;  Location: [facility];  Service: Orthopedics;  Laterality: Left;   VIDEO ASSISTED THORACOSCOPY (VATS)/THOROCOTOMY Left 07/13/2013   VATS with insertion of chest tubes  Current Medications: Current Meds  Medication Sig   albuterol (PROVENTIL HFA;VENTOLIN HFA) 108 (90 Base) MCG/ACT inhaler Inhale two puffs every four to six hours as needed for cough or wheeze.   Ascorbic Acid (VITAMIN C) 1000 MG tablet Take 1,000 mg by mouth daily.   budesonide-formoterol (SYMBICORT) 80-4.5 MCG/ACT inhaler INHALE 2 PUFFS INTO LUNGS TWICE DAILY, IN THE MORNING AND EVENING   Ca & Phos-Vit D-Mag (CALCIUM) [phone]  TABS Take 1 tablet by mouth daily.   Calcium Carb-Cholecalciferol (CALCIUM + D3) 600-200 MG-UNIT TABS Take 1 tablet by mouth 2 (two) times daily.   celecoxib (CELEBREX) 100 MG capsule Take 1 capsule (100 mg total) by mouth 2 (two) times daily.   Cholecalciferol (VITAMIN D3) 250 MCG (10000 UT) TABS Take 1 tablet by mouth daily.   estradiol (ESTRACE) 0.1 MG/GM vaginal cream Place 1 Applicatorful vaginally at bedtime.   famotidine (PEPCID) 40 MG tablet TAKE 1 TABLET(40 MG) BY MOUTH TWICE DAILY   fluticasone (FLONASE) 50 MCG/ACT nasal spray Can use one spray in each nostril once daily as directed.   hydrocortisone 1 % ointment Apply 1 application topically 2 (two) times daily.   Hypromellose (ARTIFICIAL TEARS OP) Apply 1 drop to eye as needed for dry eyes.   loratadine (CLARITIN) 10 MG tablet Take 10 mg by mouth daily.   Multiple Vitamins-Minerals (HAIR SKIN AND NAILS FORMULA) TABS Take 1 tablet by mouth daily.   Multiple Vitamins-Minerals (MULTIVITAMIN PO) Take 1 tablet by mouth daily.   mupirocin ointment (BACTROBAN) 2 % Apply 1 application topically 3 (three) times daily.   ondansetron (ZOFRAN ODT) 4 MG disintegrating tablet Take 1 tablet (4 mg total) by mouth every 8 (eight) hours as needed for nausea or vomiting.   Potassium 95 MG TABS Take 1 tablet by mouth daily.   simvastatin (ZOCOR) 40 MG tablet TAKE 1 TABLET(40 MG) BY MOUTH DAILY   tamsulosin (FLOMAX) 0.4 MG CAPS capsule Take 0.4 mg by mouth daily.   VITAMIN E PO Take 1 capsule by mouth daily.     Allergies:   Tetanus toxoids, Bee venom, Hornet venom, Dilaudid [hydromorphone], Erythromycin, Fiorinal [butalbital-aspirin-caffeine], Amoxicillin, Doxycycline, Latex, Neomycin, and Penicillins   Social History   Socioeconomic History   Marital status: Widowed    Spouse name: Not on file   Number of children: 1   Years of education: Not on file   Highest education level: Not on file  Occupational History   Not on file  Tobacco Use    Smoking status: Never   Smokeless tobacco: Never  Vaping Use   Vaping Use: Never used  Substance and Sexual Activity   Alcohol use: Yes    Alcohol/week: 2.0 standard drinks    Types: 2 Glasses of wine per week   Drug use: No   Sexual activity: Not Currently  Other Topics Concern   Not on file  Social History Narrative   Not on file   Social Determinants of Health   Financial Resource Strain: Not on file  Food Insecurity: No Food Insecurity   Worried About Running Out of Food in the Last Year: Never true   Ran Out of Food in the Last Year: Never true  Transportation Needs: No Transportation Needs   Lack of Transportation (Medical): No   Lack of Transportation (Non-Medical): No  Physical Activity: Not on file  Stress: Not on file  Social Connections: Not on file     Family History: The patient's family history is not on file.  ROS:   Please see the history of present illness.    All other systems reviewed and are negative.  EKGs/Labs/Other Studies Reviewed:    The following studies were reviewed today: EKG reveals sinus rhythm and nonspecific ST-T changes   Recent Labs: 03/18/2021: ALT 16; BUN 20; Creatinine, Ser 1.00; Hemoglobin 11.9; Platelets 348; Potassium 4.6; Sodium 141  Recent Lipid Panel    Component Value Date/Time   CHOL 193 03/18/2021 0922   TRIG 127 03/18/2021 0922   HDL 72 03/18/2021 0922   CHOLHDL 2.7 03/18/2021 0922   LDLCALC 99 03/18/2021 0922    Physical Exam:    VS:  BP (!) 148/66   Pulse 86   Ht 4\' 11"  (1.499 m)   Wt 152 lb 3.2 oz (69 kg)   SpO2 96%   BMI 30.74 kg/m     Wt Readings from Last 3 Encounters:  04/08/21 152 lb 3.2 oz (69 kg)  03/18/21 150 lb (68 kg)  02/24/21 151 lb (68.5 kg)     GEN: Patient is in no acute distress HEENT: Normal NECK: No JVD; No carotid bruits LYMPHATICS: No lymphadenopathy CARDIAC: Hear sounds regular, 2/6 systolic murmur at the apex. RESPIRATORY:  Clear to auscultation without rales, wheezing or  rhonchi  ABDOMEN: Soft, non-tender, non-distended MUSCULOSKELETAL:  No edema; No deformity  SKIN: Warm and dry NEUROLOGIC:  Alert and oriented x 3 PSYCHIATRIC:  Normal affect   Signed, 04/26/21, MD  04/08/2021 1:31 PM    [facility]

## 2021-04-08 NOTE — Addendum Note (Signed)
Addended by: Eleonore Chiquito on: 04/08/2021 01:39 PM   Modules accepted: Orders

## 2021-04-08 NOTE — Patient Instructions (Signed)
Medication Instructions:  Your physician recommends that you continue on your current medications as directed. Please refer to the Current Medication list given to you today.  *If you need a refill on your cardiac medications before your next appointment, please call your pharmacy*   Lab Work: None ordered If you have labs (blood work) drawn today and your tests are completely normal, you will receive your results only by: MyChart Message (if you have MyChart) OR A paper copy in the mail If you have any lab test that is abnormal or we need to change your treatment, we will call you to review the results.   Testing/Procedures: Your physician has requested that you have an echocardiogram. Echocardiography is a painless test that uses sound waves to create images of your heart. It provides your doctor with information about the size and shape of your heart and how well your heart's chambers and valves are working. This procedure takes approximately one hour. There are no restrictions for this procedure.   WHY IS MY DOCTOR PRESCRIBING ZIO? The Zio system is proven and trusted by physicians to detect and diagnose irregular heart rhythms -- and has been prescribed to hundreds of thousands of patients.  The FDA has cleared the Zio system to monitor for many different kinds of irregular heart rhythms. In a study, physicians were able to reach a diagnosis 90% of the time with the Zio system1.  You can wear the Zio monitor -- a small, discreet, comfortable patch -- during your normal day-to-day activity, including while you sleep, shower, and exercise, while it records every single heartbeat for analysis.  1Barrett, P., et al. Comparison of 24 Hour Holter Monitoring Versus 14 Day Novel Adhesive Patch Electrocardiographic Monitoring. American Journal of Medicine, 2014.  ZIO VS. HOLTER MONITORING The Zio monitor can be comfortably worn for up to 14 days. Holter monitors can be worn for 24 to 48  hours, limiting the time to record any irregular heart rhythms you may have. Zio is able to capture data for the 51% of patients who have their first symptom-triggered arrhythmia after 48 hours.1  LIVE WITHOUT RESTRICTIONS The Zio ambulatory cardiac monitor is a small, unobtrusive, and water-resistant patch--you might even forget you're wearing it. The Zio monitor records and stores every beat of your heart, whether you're sleeping, working out, or showering.  Remove the monitor 04/22/21.  Follow-Up: At [facility], you and your health needs are our priority.  As part of our continuing mission to provide you with exceptional heart care, we have created designated Provider Care Teams.  These Care Teams include your primary Cardiologist (physician) and Advanced Practice Providers (APPs -  Physician Assistants and Nurse Practitioners) who all work together to provide you with the care you need, when you need it.  We recommend signing up for the patient portal called "MyChart".  Sign up information is provided on this After Visit Summary.  MyChart is used to connect with patients for Virtual Visits (Telemedicine).  Patients are able to view lab/test results, encounter notes, upcoming appointments, etc.  Non-urgent messages can be sent to your provider as well.   To learn more about what you can do with MyChart, go to [email or web address].    Your next appointment:   2 month(s)  The format for your next appointment:   In Person  Provider:   Belva Crome, MD   Other Instructions Echocardiogram An echocardiogram is a test that uses sound waves (ultrasound) to produce images of the heart. Images  from an echocardiogram can provide important information about: Heart size and shape. The size and thickness and movement of your heart's walls. Heart muscle function and strength. Heart valve function or if you have stenosis. Stenosis is when the heart valves are too narrow. If blood is  flowing backward through the heart valves (regurgitation). A tumor or infectious growth around the heart valves. Areas of heart muscle that are not working well because of poor blood flow or injury from a heart attack. Aneurysm detection. An aneurysm is a weak or damaged part of an artery wall. The wall bulges out from the normal force of blood pumping through the body. Tell a health care provider about: Any allergies you have. All medicines you are taking, including vitamins, herbs, eye drops, creams, and over-the-counter medicines. Any blood disorders you have. Any surgeries you have had. Any medical conditions you have. Whether you are pregnant or may be pregnant. What are the risks? Generally, this is a safe test. However, problems may occur, including an allergic reaction to dye (contrast) that may be used during the test. What happens before the test? No specific preparation is needed. You may eat and drink normally. What happens during the test? You will take off your clothes from the waist up and put on a hospital gown. Electrodes or electrocardiogram (ECG)patches may be placed on your chest. The electrodes or patches are then connected to a device that monitors your heart rate and rhythm. You will lie down on a table for an ultrasound exam. A gel will be applied to your chest to help sound waves pass through your skin. A handheld device, called a transducer, will be pressed against your chest and moved over your heart. The transducer produces sound waves that travel to your heart and bounce back (or "echo" back) to the transducer. These sound waves will be captured in real-time and changed into images of your heart that can be viewed on a video monitor. The images will be recorded on a computer and reviewed by your health care provider. You may be asked to change positions or hold your breath for a short time. This makes it easier to get different views or better views of your heart. In  some cases, you may receive contrast through an IV in one of your veins. This can improve the quality of the pictures from your heart. The procedure may vary among health care providers and hospitals.   What can I expect after the test? You may return to your normal, everyday life, including diet, activities, and medicines, unless your health care provider tells you not to do that. Follow these instructions at home: It is up to you to get the results of your test. Ask your health care provider, or the department that is doing the test, when your results will be ready. Keep all follow-up visits. This is important. Summary An echocardiogram is a test that uses sound waves (ultrasound) to produce images of the heart. Images from an echocardiogram can provide important information about the size and shape of your heart, heart muscle function, heart valve function, and other possible heart problems. You do not need to do anything to prepare before this test. You may eat and drink normally. After the echocardiogram is completed, you may return to your normal, everyday life, unless your health care provider tells you not to do that. This information is not intended to replace advice given to you by your health care provider. Make sure you discuss  any questions you have with your health care provider. Document Revised: 02/03/2020 Document Reviewed: 02/03/2020 Elsevier Patient Education  2021 Elsevier Inc.   FDA-cleared personal EKG: The world's most clinically validated personal EKG, FDA-cleared to detect Atrial Fibrillation, Bradycardia, and Tachycardia. Mayford Knife is the most reliable way to check in on your heart from home. Take your EKG from anywhere: Capture a medical-grade EKG in 30 seconds and get an instant analysis right on your smartphone. Mayford Knife is small enough to fit in your pocket, so you can take it with you anywhere. Easy to use: Simply place your fingers on the sensors--no wires,  patches, or gels. Recommended by doctors: A trusted resource, Mayford Knife is the #1 doctor-recommended personal EKG with more than 100 million EKGs recorded. Save or share your EKGs: With the press of a button, email your EKGs to your doctor or save them on your phone. Works with smartphones: Compatible with Event organiser and tablets. Check our compatibility chart. FSA/HSA eligible: Purchase using an FSA or HSA account (please confirm coverage with your insurance provider). Phone clip included with purchase, a $15 value. Conveniently take your device with you wherever you go.  [email or web address]

## 2021-04-09 LAB — TSH: TSH: 1.29 u[IU]/mL (ref 0.450–4.500)

## 2021-04-12 ENCOUNTER — Other Ambulatory Visit: Payer: Medicare Other

## 2021-04-14 ENCOUNTER — Telehealth: Payer: Self-pay | Admitting: Cardiology

## 2021-04-14 NOTE — Telephone Encounter (Signed)
Patient states the red light has been flashing on her heart monitor and is not sure why. She says it started Saturday so she pushed the black button and it stopped the light, but she has not done it again. She says she is not sure what she needs to do about the light or why it has been blinking.

## 2021-04-14 NOTE — Telephone Encounter (Signed)
Advised to call Zio for guidance.

## 2021-04-19 ENCOUNTER — Telehealth: Payer: Self-pay

## 2021-04-19 NOTE — Telephone Encounter (Signed)
-----   Message from Rajan R Revankar, MD sent at 04/19/2021 12:09 AM EDT ----- The results of the study is unremarkable. Please inform patient. I will discuss in detail at next appointment. Cc  primary care/referring physician Rajan R Revankar, MD 04/19/2021 12:09 AM  

## 2021-04-19 NOTE — Telephone Encounter (Signed)
Spoke with patient regarding results and recommendation.  Patient verbalizes understanding and is agreeable to plan of care. Advised patient to call back with any issues or concerns.  

## 2021-04-20 ENCOUNTER — Ambulatory Visit (INDEPENDENT_AMBULATORY_CARE_PROVIDER_SITE_OTHER): Payer: Medicare Other

## 2021-04-20 ENCOUNTER — Other Ambulatory Visit: Payer: Self-pay

## 2021-04-20 DIAGNOSIS — R011 Cardiac murmur, unspecified: Secondary | ICD-10-CM | POA: Diagnosis not present

## 2021-04-20 DIAGNOSIS — Z23 Encounter for immunization: Secondary | ICD-10-CM

## 2021-04-20 LAB — ECHOCARDIOGRAM COMPLETE
Area-P 1/2: 2.79 cm2
P 1/2 time: 566 msec
S' Lateral: 2.4 cm

## 2021-04-22 DIAGNOSIS — R002 Palpitations: Secondary | ICD-10-CM

## 2021-04-27 DIAGNOSIS — R002 Palpitations: Secondary | ICD-10-CM | POA: Diagnosis not present

## 2021-04-29 ENCOUNTER — Telehealth: Payer: Self-pay

## 2021-04-29 DIAGNOSIS — I4891 Unspecified atrial fibrillation: Secondary | ICD-10-CM

## 2021-04-29 NOTE — Telephone Encounter (Signed)
-----   Message from Garwin Brothers, MD sent at 04/28/2021  4:06 PM EDT ----- Significantly abnormal event monitor.Patient for Chem-7 and CBC tomorrow morning.  Also give stool for I fob.  Let the patient know about atrial fibrillation and potential for stroke.  Please tell patient about benefits and risks of anticoagulation.  If the patient is willing to start you can start her on Eliquis 5 mg twice daily and also I would like to see her in an appointment next week to discuss these issues at length.  Also metoprolol 25 mg succinate once daily.  Copy primary care Garwin Brothers, MD 04/28/2021 4:04 PM

## 2021-05-02 ENCOUNTER — Telehealth: Payer: Self-pay | Admitting: Cardiology

## 2021-05-02 DIAGNOSIS — I4891 Unspecified atrial fibrillation: Secondary | ICD-10-CM | POA: Diagnosis not present

## 2021-05-02 MED ORDER — APIXABAN 5 MG PO TABS: 5.0000 mg | ORAL_TABLET | Freq: Two times a day (BID) | ORAL | 3 refills | Status: DC

## 2021-05-02 MED ORDER — METOPROLOL TARTRATE 25 MG PO TABS: 25.0000 mg | ORAL_TABLET | Freq: Every day | ORAL | 3 refills | Status: DC

## 2021-05-02 NOTE — Telephone Encounter (Signed)
Patient returned call

## 2021-05-02 NOTE — Telephone Encounter (Signed)
Spoke with patient regarding results and recommendation.  Patient verbalizes understanding and is agreeable to plan of care. Advised patient to call back with any issues or concerns.  

## 2021-05-02 NOTE — Telephone Encounter (Signed)
Left message on patients voicemail to please return our call.   

## 2021-05-02 NOTE — Addendum Note (Signed)
Addended by: Delorse Limber I on: 05/02/2021 10:22 AM   Modules accepted: Orders

## 2021-05-02 NOTE — Telephone Encounter (Signed)
Pt states that we called Friday to tell her that her monitor results showed she was in Atrial Fib, Pt states monitor was off more than it was on, it kept coming off. Would like to discuss with Dr. Tomie China before proceeding  Thank you  Domingo Dimes

## 2021-05-03 LAB — BASIC METABOLIC PANEL
BUN/Creatinine Ratio: 20 (ref 12–28)
BUN: 21 mg/dL (ref 8–27)
CO2: 25 mmol/L (ref 20–29)
Calcium: 10.1 mg/dL (ref 8.7–10.3)
Chloride: 102 mmol/L (ref 96–106)
Creatinine, Ser: 1.03 mg/dL — ABNORMAL HIGH (ref 0.57–1.00)
Glucose: 99 mg/dL (ref 70–99)
Potassium: 4.5 mmol/L (ref 3.5–5.2)
Sodium: 139 mmol/L (ref 134–144)
eGFR: 55 mL/min/{1.73_m2} — ABNORMAL LOW (ref >59)

## 2021-05-03 LAB — CBC WITH DIFFERENTIAL/PLATELET
Basophils Absolute: 0.1 10*3/uL (ref 0.0–0.2)
Basos: 1 %
EOS (ABSOLUTE): 0.2 10*3/uL (ref 0.0–0.4)
Eos: 4 %
Hematocrit: 32.9 % — ABNORMAL LOW (ref 34.0–46.6)
Hemoglobin: 11.1 g/dL (ref 11.1–15.9)
Immature Grans (Abs): 0 10*3/uL (ref 0.0–0.1)
Immature Granulocytes: 0 %
Lymphocytes Absolute: 2 10*3/uL (ref 0.7–3.1)
Lymphs: 30 %
MCH: 29.9 pg (ref 26.6–33.0)
MCHC: 33.7 g/dL (ref 31.5–35.7)
MCV: 89 fL (ref 79–97)
Monocytes Absolute: 0.5 10*3/uL (ref 0.1–0.9)
Monocytes: 8 %
Neutrophils Absolute: 3.9 10*3/uL (ref 1.4–7.0)
Neutrophils: 57 %
Platelets: 281 10*3/uL (ref 150–450)
RBC: 3.71 x10E6/uL — ABNORMAL LOW (ref 3.77–5.28)
RDW: 12.7 % (ref 11.7–15.4)
WBC: 6.7 10*3/uL (ref 3.4–10.8)

## 2021-05-04 ENCOUNTER — Encounter: Payer: Self-pay | Admitting: Cardiology

## 2021-05-04 ENCOUNTER — Other Ambulatory Visit: Payer: Self-pay

## 2021-05-04 ENCOUNTER — Ambulatory Visit: Payer: Medicare Other | Admitting: Cardiology

## 2021-05-04 VITALS — BP 132/60 | HR 70 | Ht 60.0 in | Wt 155.8 lb

## 2021-05-04 DIAGNOSIS — R002 Palpitations: Secondary | ICD-10-CM

## 2021-05-04 DIAGNOSIS — R0602 Shortness of breath: Secondary | ICD-10-CM | POA: Diagnosis not present

## 2021-05-04 DIAGNOSIS — E782 Mixed hyperlipidemia: Secondary | ICD-10-CM | POA: Diagnosis not present

## 2021-05-04 DIAGNOSIS — I4891 Unspecified atrial fibrillation: Secondary | ICD-10-CM | POA: Diagnosis not present

## 2021-05-04 DIAGNOSIS — Z79899 Other long term (current) drug therapy: Secondary | ICD-10-CM

## 2021-05-04 DIAGNOSIS — I480 Paroxysmal atrial fibrillation: Secondary | ICD-10-CM | POA: Diagnosis not present

## 2021-05-04 DIAGNOSIS — I10 Essential (primary) hypertension: Secondary | ICD-10-CM | POA: Diagnosis not present

## 2021-05-04 DIAGNOSIS — R42 Dizziness and giddiness: Secondary | ICD-10-CM | POA: Diagnosis not present

## 2021-05-04 HISTORY — DX: Paroxysmal atrial fibrillation: I48.0

## 2021-05-04 NOTE — Patient Instructions (Addendum)
Medication Instructions:  Your physician has recommended you make the following change in your medication:   Start Amiodarone 400 mg daily times 2 weeks then decrease to 200 mg daily.   *If you need a refill on your cardiac medications before your next appointment, please call your pharmacy*   Lab Work: Your physician recommends that you have labs done in the office today. Your test included  basic metabolic panel and LFT's. You need to also do a ifob.  If you have labs (blood work) drawn today and your tests are completely normal, you will receive your results only by: MyChart Message (if you have MyChart) OR A paper copy in the mail If you have any lab test that is abnormal or we need to change your treatment, we will call you to review the results.   Testing/Procedures: A chest x-ray takes a picture of the organs and structures inside the chest, including the heart, lungs, and blood vessels. This test can show several things, including, whether the heart is enlarges; whether fluid is building up in the lungs; and whether pacemaker / defibrillator leads are still in place.    Follow-Up: At [facility], you and your health needs are our priority.  As part of our continuing mission to provide you with exceptional heart care, we have created designated Provider Care Teams.  These Care Teams include your primary Cardiologist (physician) and Advanced Practice Providers (APPs -  Physician Assistants and Nurse Practitioners) who all work together to provide you with the care you need, when you need it.  We recommend signing up for the patient portal called "MyChart".  Sign up information is provided on this After Visit Summary.  MyChart is used to connect with patients for Virtual Visits (Telemedicine).  Patients are able to view lab/test results, encounter notes, upcoming appointments, etc.  Non-urgent messages can be sent to your provider as well.   To learn more about what you can do with  MyChart, go to [email or web address].    Your next appointment:   1 month(s)  The format for your next appointment:   In Person  Provider:   Belva Crome, MD   Other Instructions NA

## 2021-05-04 NOTE — Progress Notes (Signed)
Cardiology Office Note:    Date:  05/04/2021   ID:  Shannon Martin, DOB 1941/04/13, MRN [id number]  PCP:  Abigail Miyamoto, MD  Cardiologist:  Garwin Brothers, MD   Referring MD: Abigail Miyamoto,*    ASSESSMENT:    1. Essential hypertension   2. Mixed hyperlipidemia   3. Palpitations   4. PAF (paroxysmal atrial fibrillation) (HCC)    PLAN:    In order of problems listed above:  Primary prevention stressed with the patient.  Importance of compliance with diet medication stressed and she vocalized understanding. Paroxysmal atrial fibrillation:I discussed with the patient atrial fibrillation, disease process. Management and therapy including rate and rhythm control, anticoagulation benefits and potential risks were discussed extensively with the patient. Patient had multiple questions which were answered to patient's satisfaction. Amiodarone therapy: Benefits and potential is explained to the patient extensively.  She is agreeable to start the medicine and we will start 400 mg once daily for 2 weeks followed by 200 mg daily for 2 weeks.  She will have blood work as baseline and chest x-ray today. Essential hypertension: Blood pressure stable and lifestyle modification was urged. Patient will be seen in follow-up appointment in 6 months or earlier if the patient has any concerns    Medication Adjustments/Labs and Tests Ordered: Current medicines are reviewed at length with the patient today.  Concerns regarding medicines are outlined above.  No orders of the defined types were placed in this encounter.  No orders of the defined types were placed in this encounter.    No chief complaint on file.    History of Present Illness:    Shannon Martin is a 80 y.o. female.  Patient has past medical history of essential hypertension, dyslipidemia.  She was referred for palpitations and event monitoring significant amount of atrial fibrillation paroxysms.  For this reason  patient was initiated on anticoagulation.  She is here for follow-up visit.  She denies any chest pain orthopnea or PND.  She takes care of activities of daily living.  She has not noticed any blood in her stools or any such issues.  At the time of my evaluation, the patient is alert awake oriented and in no distress.  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   Laryngopharyngeal reflux (LPR) 03/24/2015   Left knee pain 10/01/2014   Migraine without aura and without status migrainosus, not intractable 07/28/2019   Migraines 1943 to present   "fairly frequently"; Takes Propranolol (01/12/2015)   Mixed hyperlipidemia 12/11/2018   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   Overactive bladder    Takes Vesicare   Palpitations 04/08/2021   Primary osteoarthritis of left knee 12/03/2014   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   Seborrheic dermatitis 12/21/2020   Syncope and collapse 12/11/2018    Past Surgical History:  Procedure Laterality Date   BREAST BIOPSY Left ~ 1973   CATARACT EXTRACTION W/ INTRAOCULAR LENS  IMPLANT, BILATERAL Bilateral 2003   CHEST TUBE INSERTION  January 2015   X 2 at [facility]   DILATION AND CURETTAGE OF UTERUS  4-5   JOINT REPLACEMENT     REPLACEMENT TOTAL KNEE Left    TONSILLECTOMY AND ADENOIDECTOMY  1946; 9211  TOTAL KNEE ARTHROPLASTY Left 01/11/2015   TOTAL KNEE ARTHROPLASTY Left 01/11/2015   Procedure: Left TOTAL KNEE ARTHROPLASTY;  Surgeon: Dannielle Huh, MD;  Location: [facility] OR;  Service: Orthopedics;  Laterality: Left;   VIDEO ASSISTED THORACOSCOPY (VATS)/THOROCOTOMY Left 07/13/2013   VATS with insertion of chest tubes    Current Medications: Current Meds  Medication Sig    albuterol (PROVENTIL HFA;VENTOLIN HFA) 108 (90 Base) MCG/ACT inhaler Inhale two puffs every four to six hours as needed for cough or wheeze.   apixaban (ELIQUIS) 5 MG TABS tablet Take 1 tablet (5 mg total) by mouth 2 (two) times daily.   aspirin EC 81 MG tablet Take 81 mg by mouth daily.   budesonide-formoterol (SYMBICORT) 80-4.5 MCG/ACT inhaler INHALE 2 PUFFS INTO LUNGS TWICE DAILY, IN THE MORNING AND EVENING   Calcium Carb-Cholecalciferol (CALCIUM + D3) 600-200 MG-UNIT TABS Take 1 tablet by mouth 2 (two) times daily.   celecoxib (CELEBREX) 100 MG capsule Take 1 capsule (100 mg total) by mouth 2 (two) times daily.   Cholecalciferol (D3-1000) 25 MCG (1000 UT) capsule Take 1,000 Units by mouth daily.   estradiol (ESTRACE) 0.1 MG/GM vaginal cream Place 1 Applicatorful vaginally at bedtime.   famotidine (PEPCID) 40 MG tablet TAKE 1 TABLET(40 MG) BY MOUTH TWICE DAILY   fluticasone (FLONASE) 50 MCG/ACT nasal spray Can use one spray in each nostril once daily as directed.   Hypromellose (ARTIFICIAL TEARS OP) Apply 1 drop to eye as needed for dry eyes.   loratadine (CLARITIN) 10 MG tablet Take 10 mg by mouth daily.   metoprolol tartrate (LOPRESSOR) 25 MG tablet Take 1 tablet (25 mg total) by mouth daily.   Multiple Vitamins-Minerals (MULTIVITAMIN PO) Take 1 tablet by mouth daily.   ranitidine (ZANTAC) 75 MG tablet Take 75 mg by mouth once a week.   simvastatin (ZOCOR) 40 MG tablet TAKE 1 TABLET(40 MG) BY MOUTH DAILY   tamsulosin (FLOMAX) 0.4 MG CAPS capsule Take 0.4 mg by mouth daily.   VITAMIN E PO Take 400 Units by mouth 2 (two) times daily.     Allergies:   Tetanus toxoids, Bee venom, Hornet venom, Dilaudid [hydromorphone], Erythromycin, Fiorinal [butalbital-aspirin-caffeine], Amoxicillin, Doxycycline, Latex, Neomycin, and Penicillins   Social History   Socioeconomic History   Marital status: Widowed    Spouse name: Not on file   Number of children: 1   Years of education: Not on file    Highest education level: Not on file  Occupational History   Not on file  Tobacco Use   Smoking status: Never   Smokeless tobacco: Never  Vaping Use   Vaping Use: Never used  Substance and Sexual Activity   Alcohol use: Yes    Alcohol/week: 2.0 standard drinks    Types: 2 Glasses of wine per week   Drug use: No   Sexual activity: Not Currently  Other Topics Concern   Not on file  Social History Narrative   Not on file   Social Determinants of Health   Financial Resource Strain: Not on file  Food Insecurity: No Food Insecurity   Worried About Running Out of Food in the Last Year: Never true   Ran Out of Food in the Last Year: Never true  Transportation Needs: No Transportation Needs   Lack of Transportation (Medical): No   Lack of Transportation (Non-Medical): No  Physical Activity: Not on file  Stress: Not on file  Social Connections: Not on file     Family History: The patient's family  history is not on file.  ROS:   Please see the history of present illness.    All other systems reviewed and are negative.  EKGs/Labs/Other Studies Reviewed:    The following studies were reviewed today: Patch Wear Time:  13 days and 20 hours (2022-10-14T13:36:41-0400 to 2022-10-28T10:34:49-0400)   Patient had a min HR of 42 bpm, max HR of 210 bpm, and avg HR of 71 bpm.    Predominant underlying rhythm was Sinus Rhythm. First Degree AV Block was present.   18 Supraventricular Tachycardia runs occurred, the run with the fastest interval lasting 5 beats with a max rate of 210 bpm, the longest lasting 17.4 secs with an avg rate of 99 bpm. Some episodes of Supraventricular Tachycardia may be possible Atrial Tachycardia with variable block.    Atrial Fibrillation occurred (<1% burden), ranging from 84-209 bpm (avg of 140 bpm), the longest lasting 41 mins 55 secs with an avg rate of 140 bpm. Isolated SVEs were rare (<1.0%), SVE Couplets were rare (<1.0%), and SVE Triplets were rare  (<1.0%).    Isolated VEs were rare (<1.0%), VE Couplets were rare (<1.0%), and no VE Triplets were present.   Impression: Abnormal event monitoring with significant paroxysms of atrial fibrillation and supraventricular tachycardia as noted above.   IMPRESSIONS     1. Left ventricular ejection fraction, by estimation, is 60 to 65%. The  left ventricle has normal function. The left ventricle has no regional  wall motion abnormalities. Left ventricular diastolic parameters are  consistent with Grade I diastolic  dysfunction (impaired relaxation).   2. The mitral valve is normal in structure. No evidence of mitral valve  regurgitation. No evidence of mitral stenosis.   3. The aortic valve is normal in structure. Aortic valve regurgitation is  trivial. No aortic stenosis is present.   4. The inferior vena cava is normal in size with greater than 50%  respiratory variability, suggesting right atrial pressure of 3 mmHg.    Recent Labs: 03/18/2021: ALT 16 04/08/2021: TSH 1.290 05/02/2021: BUN 21; Creatinine, Ser 1.03; Hemoglobin 11.1; Platelets 281; Potassium 4.5; Sodium 139  Recent Lipid Panel    Component Value Date/Time   CHOL 193 03/18/2021 0922   TRIG 127 03/18/2021 0922   HDL 72 03/18/2021 0922   CHOLHDL 2.7 03/18/2021 0922   LDLCALC 99 03/18/2021 0922    Physical Exam:    VS:  BP 132/60   Pulse 70   Ht 5' (1.524 m)   Wt 155 lb 12.8 oz (70.7 kg)   SpO2 97%   BMI 30.43 kg/m     Wt Readings from Last 3 Encounters:  05/04/21 155 lb 12.8 oz (70.7 kg)  04/08/21 152 lb 3.2 oz (69 kg)  03/18/21 150 lb (68 kg)     GEN: Patient is in no acute distress HEENT: Normal NECK: No JVD; No carotid bruits LYMPHATICS: No lymphadenopathy CARDIAC: Hear sounds regular, 2/6 systolic murmur at the apex. RESPIRATORY:  Clear to auscultation without rales, wheezing or rhonchi  ABDOMEN: Soft, non-tender, non-distended MUSCULOSKELETAL:  No edema; No deformity  SKIN: Warm and  dry NEUROLOGIC:  Alert and oriented x 3 PSYCHIATRIC:  Normal affect   Signed, Garwin Brothers, MD  05/04/2021 2:26 PM    [facility]

## 2021-05-05 ENCOUNTER — Telehealth: Payer: Self-pay | Admitting: Cardiology

## 2021-05-05 DIAGNOSIS — I480 Paroxysmal atrial fibrillation: Secondary | ICD-10-CM | POA: Diagnosis not present

## 2021-05-05 LAB — BASIC METABOLIC PANEL
BUN/Creatinine Ratio: 20 (ref 12–28)
BUN: 23 mg/dL (ref 8–27)
CO2: 23 mmol/L (ref 20–29)
Calcium: 10 mg/dL (ref 8.7–10.3)
Chloride: 104 mmol/L (ref 96–106)
Creatinine, Ser: 1.15 mg/dL — ABNORMAL HIGH (ref 0.57–1.00)
Glucose: 93 mg/dL (ref 70–99)
Potassium: 4 mmol/L (ref 3.5–5.2)
Sodium: 145 mmol/L — ABNORMAL HIGH (ref 134–144)
eGFR: 48 mL/min/{1.73_m2} — ABNORMAL LOW (ref >59)

## 2021-05-05 LAB — HEPATIC FUNCTION PANEL
ALT: 15 IU/L (ref 0–32)
AST: 24 IU/L (ref 0–40)
Albumin: 4.4 g/dL (ref 3.7–4.7)
Alkaline Phosphatase: 64 IU/L (ref 44–121)
Bilirubin Total: <0.2 mg/dL (ref 0.0–1.2)
Bilirubin, Direct: <0.1 mg/dL (ref 0.00–0.40)
Total Protein: 6.3 g/dL (ref 6.0–8.5)

## 2021-05-05 MED ORDER — AMIODARONE HCL 200 MG PO TABS: ORAL_TABLET | ORAL | 3 refills | Status: DC

## 2021-05-05 NOTE — Addendum Note (Signed)
Addended by: Eleonore Chiquito on: 05/05/2021 10:10 AM   Modules accepted: Orders

## 2021-05-05 NOTE — Telephone Encounter (Signed)
Patient states that sh was returning call. Please advise

## 2021-05-05 NOTE — Telephone Encounter (Signed)
Results reviewed with pt as per Dr. Revankar's note.  Pt verbalized understanding and had no additional questions. Routed to PCP.  

## 2021-05-08 LAB — FECAL OCCULT BLOOD, IMMUNOCHEMICAL: Fecal Occult Bld: NEGATIVE

## 2021-05-11 ENCOUNTER — Telehealth: Payer: Self-pay | Admitting: Cardiology

## 2021-05-11 NOTE — Telephone Encounter (Signed)
Spoke with pt who states that this is the only new medication that she has started once daily. Pt states that she has checked her bp and it is the same as it was before she started the medication as it is now. How do you advise?

## 2021-05-11 NOTE — Telephone Encounter (Signed)
Recommendations reviewed with pt as per Dr. Revankar's note.  Pt verbalized understanding and had no additional questions.   

## 2021-05-11 NOTE — Addendum Note (Signed)
Addended by: Eleonore Chiquito on: 05/11/2021 03:57 PM   Modules accepted: Orders

## 2021-05-11 NOTE — Telephone Encounter (Signed)
  Pt c/o medication issue:  1. Name of Medication:   metoprolol tartrate (LOPRESSOR) 25 MG tablet    2. How are you currently taking this medication (dosage and times per day)? Take 1 tablet (25 mg total) by mouth daily.  3. Are you having a reaction (difficulty breathing--STAT)?   4. What is your medication issue? Pt said this is a new meds Dr. Tomie China prescribed her and she thinks she's getting a side effect with this meds, she said after taking it in the morning she get dizzy that she can't get up then after 20 mins she feels nauseous then throw up

## 2021-05-23 ENCOUNTER — Telehealth: Payer: Self-pay | Admitting: Cardiology

## 2021-05-23 NOTE — Telephone Encounter (Signed)
Pt c/o medication issue:  1. Name of Medication: metoprolol tartrate (LOPRESSOR) 25 MG tablet  2. How are you currently taking this medication (dosage and times per day)? Not currently taking   3. Are you having a reaction (difficulty breathing--STAT)? Nausea and dizziness   4. What is your medication issue? bp medicine made her dizzy and nauseaus.. pt stopped taking and was supposed to callback last week and forgot but is feeling much better since stopping the medication.... calling now to see next steps.. please advise

## 2021-05-23 NOTE — Telephone Encounter (Signed)
Pt aware of recommendation. Pt verbalized understanding and had no additional questions.

## 2021-05-23 NOTE — Telephone Encounter (Signed)
BP 120/62 HR 45-67  Pt states that she feels much better since stopping the Metoprolol.

## 2021-05-24 ENCOUNTER — Telehealth: Payer: Self-pay | Admitting: Cardiology

## 2021-05-24 NOTE — Telephone Encounter (Signed)
Patient wanted to know if her appt 06/02/21 is still necessary. Please advise

## 2021-05-25 NOTE — Telephone Encounter (Signed)
Recommendations reviewed with pt as per Dr. Revankar's note.  Pt verbalized understanding and had no additional questions.   

## 2021-06-02 ENCOUNTER — Ambulatory Visit: Payer: Medicare Other | Admitting: Cardiology

## 2021-06-09 ENCOUNTER — Telehealth: Payer: Self-pay | Admitting: Cardiology

## 2021-06-09 NOTE — Telephone Encounter (Signed)
Spoke to the patient just now and let her know that she can send this over to Dr. Tomie China through her MyChart. I walked her through how to do this and she states she is going to have her daughter to help her with this as she is not familiar with technology. She will call back if she has any other issues or concerns.    Encouraged patient to call back with any questions or concerns.

## 2021-06-09 NOTE — Telephone Encounter (Signed)
Patient got Shannon Martin app and wants to know where the details are going.  Please call back

## 2021-06-22 ENCOUNTER — Other Ambulatory Visit: Payer: Self-pay | Admitting: Legal Medicine

## 2021-06-24 ENCOUNTER — Ambulatory Visit: Payer: Medicare Other | Admitting: Cardiology

## 2021-07-12 ENCOUNTER — Other Ambulatory Visit: Payer: Self-pay

## 2021-07-14 ENCOUNTER — Encounter: Payer: Self-pay | Admitting: Cardiology

## 2021-07-14 ENCOUNTER — Ambulatory Visit (INDEPENDENT_AMBULATORY_CARE_PROVIDER_SITE_OTHER): Payer: Medicare HMO | Admitting: Cardiology

## 2021-07-14 ENCOUNTER — Other Ambulatory Visit: Payer: Self-pay

## 2021-07-14 VITALS — BP 132/62 | HR 64 | Ht 59.6 in | Wt 157.2 lb

## 2021-07-14 DIAGNOSIS — I480 Paroxysmal atrial fibrillation: Secondary | ICD-10-CM | POA: Diagnosis not present

## 2021-07-14 DIAGNOSIS — I10 Essential (primary) hypertension: Secondary | ICD-10-CM

## 2021-07-14 DIAGNOSIS — E782 Mixed hyperlipidemia: Secondary | ICD-10-CM

## 2021-07-14 DIAGNOSIS — R6889 Other general symptoms and signs: Secondary | ICD-10-CM | POA: Diagnosis not present

## 2021-07-14 MED ORDER — AMIODARONE HCL 100 MG PO TABS: 100.0000 mg | ORAL_TABLET | Freq: Every day | ORAL | 3 refills | Status: DC

## 2021-07-14 NOTE — Progress Notes (Signed)
Cardiology Office Note:    Date:  07/14/2021   ID:  Shannon Martin, DOB [birth year], MRN [id number]  PCP:  Abigail MiyamotoPerry, [name], MD  Cardiologist:  Garwin Brothersajan R Ashlee Player, MD   Referring MD: Abigail MiyamotoPerry, [name],*    ASSESSMENT:    1. PAF (paroxysmal atrial fibrillation) (HCC)   2. Essential hypertension   3. Mixed hyperlipidemia    PLAN:    In order of problems listed above:  Primary prevention stressed with the patient.  Importance of compliance with diet medication stressed and she vocalized understanding.  She was advised as appropriate and as tolerated for age. Paroxysmal atrial fibrillation: Stable:I discussed with the patient atrial fibrillation, disease process. Management and therapy including rate and rhythm control, anticoagulation benefits and potential risks were discussed extensively with the patient. Patient had multiple questions which were answered to patient's satisfaction. Essential hypertension: Blood pressure stable and diet was emphasized.  Lifestyle modification urged. Mixed dyslipidemia: Lipids were reviewed.  Diet emphasized.  Weight reduction stressed and she promises to do better. Patient will be seen in follow-up appointment in 6 months or earlier if the patient has any concerns    Medication Adjustments/Labs and Tests Ordered: Current medicines are reviewed at length with the patient today.  Concerns regarding medicines are outlined above.  No orders of the defined types were placed in this encounter.  No orders of the defined types were placed in this encounter.    No chief complaint on file.    History of Present Illness:    Shannon Martin is a 81 y.o. female.  Patient has past medical history of paroxysmal atrial fibrillation, essential hypertension and dyslipidemia.  She denies any problems at this time and takes care of activities of daily living.  No chest pain orthopnea or PND.  At the time of my evaluation, the patient is alert awake  oriented and in no distress.  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   Laryngopharyngeal reflux (LPR) 03/24/2015   Left knee pain 10/01/2014   Migraine without aura and without status migrainosus, not intractable 07/28/2019   Migraines 1943 to present   "fairly frequently"; Takes Propranolol (01/12/2015)   Mixed hyperlipidemia 12/11/2018   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   Overactive bladder    Takes Vesicare   PAF (paroxysmal atrial fibrillation) (HCC) 05/04/2021   Palpitations 04/08/2021   Primary osteoarthritis of left knee 12/03/2014   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   Seborrheic dermatitis 12/21/2020   Syncope and collapse 12/11/2018    Past Surgical History:  Procedure Laterality Date   BREAST BIOPSY Left ~ 1973   CATARACT EXTRACTION W/ INTRAOCULAR LENS  IMPLANT, BILATERAL Bilateral 2003   CHEST TUBE [facility]ION  January 2015   [facility]   DILATION AND CURETTAGE OF UTERUS  4-5   JOINT REPLACEMENT     REPLACEMENT TOTAL KNEE Left    TONSILLECTOMY AND ADENOIDECTOMY  1946; 1947   TOTAL KNEE ARTHROPLASTY Left 01/11/2015   TOTAL KNEE ARTHROPLASTY Left 01/11/2015   Procedure: Left TOTAL KNEE ARTHROPLASTY;  Surgeon: Dannielle HuhSteve Lucey, MD;  Location: [facility] OR;  Service: Orthopedics;  Laterality: Left;   VIDEO ASSISTED THORACOSCOPY (VATS)/THOROCOTOMY Left 07/13/2013   VATS with insertion of chest  tubes    Current Medications: Current Meds  Medication Sig   albuterol (PROVENTIL HFA;VENTOLIN HFA) 108 (90 Base) MCG/ACT inhaler Inhale two puffs every four to six hours as needed for cough or wheeze.   amiodarone (PACERONE) 200 MG tablet Take 200 mg by mouth daily.   apixaban (ELIQUIS) 5 MG TABS  tablet Take 1 tablet (5 mg total) by mouth 2 (two) times daily.   aspirin EC 81 MG tablet Take 81 mg by mouth daily.   budesonide-formoterol (SYMBICORT) 80-4.5 MCG/ACT inhaler INHALE 2 PUFFS INTO LUNGS TWICE DAILY, IN THE MORNING AND EVENING   Calcium Carb-Cholecalciferol (CALCIUM + D3) 600-200 MG-UNIT TABS Take 1 tablet by mouth 2 (two) times daily.   celecoxib (CELEBREX) 100 MG capsule Take 1 capsule (100 mg total) by mouth 2 (two) times daily.   Cholecalciferol (D3-1000) 25 MCG (1000 UT) capsule Take 1,000 Units by mouth daily.   estradiol (ESTRACE) 0.1 MG/GM vaginal cream Place 1 Applicatorful vaginally at bedtime.   famotidine (PEPCID) 40 MG tablet TAKE 1 TABLET(40 MG) BY MOUTH TWICE DAILY   fluticasone (FLONASE) 50 MCG/ACT nasal spray Can use one spray in each nostril once daily as directed.   Hypromellose (ARTIFICIAL TEARS OP) Apply 1 drop to eye as needed for dry eyes.   loratadine (CLARITIN) 10 MG tablet Take 10 mg by mouth daily.   Multiple Vitamins-Minerals (MULTIVITAMIN PO) Take 1 tablet by mouth daily.   simvastatin (ZOCOR) 40 MG tablet TAKE 1 TABLET(40 MG) BY MOUTH DAILY   tamsulosin (FLOMAX) 0.4 MG CAPS capsule Take 0.4 mg by mouth daily.   VITAMIN E PO Take 400 Units by mouth 2 (two) times daily.     Allergies:   Tetanus toxoids, Bee venom, Hornet venom, Dilaudid [hydromorphone], Erythromycin, Fiorinal [butalbital-aspirin-caffeine], Amoxicillin, Doxycycline, Latex, Neomycin, and Penicillins   Social History   Socioeconomic History   Marital status: Widowed    Spouse name: Not on file   Number of children: 1   Years of education: Not on file   Highest education level: Not on file  Occupational History   Not on file  Tobacco Use   Smoking status: Never   Smokeless tobacco: Never  Vaping Use   Vaping Use: Never used  Substance and Sexual Activity   Alcohol use: Yes    Alcohol/week: 2.0 standard drinks    Types: 2 Glasses of wine per week   Drug use: No   Sexual  activity: Not Currently  Other Topics Concern   Not on file  Social History Narrative   Not on file   Social Determinants of Health   Financial Resource Strain: Not on file  Food Insecurity: No Food Insecurity   Worried About Running Out of Food in the Last Year: Never true   Ran Out of Food in the Last Year: Never true  Transportation Needs: No Transportation Needs   Lack of Transportation (Medical): No   Lack of Transportation (Non-Medical): No  Physical Activity: Not on file  Stress: Not on file  Social Connections: Not on file     Family History: The patient's family history includes Diabetes in her father; Hypertension in her maternal grandfather, maternal grandmother, and mother.  ROS:   Please see the history of present illness.    All other systems reviewed and are negative.  EKGs/Labs/Other Studies Reviewed:    The following studies were reviewed today: EKG was sinus rhythm and nonspecific ST-T changes   Recent Labs: 04/08/2021: TSH 1.290 05/02/2021: Hemoglobin 11.1; Platelets  281 05/04/2021: ALT 15; BUN 23; Creatinine, Ser 1.15; Potassium 4.0; Sodium 145  Recent Lipid Panel    Component Value Date/Time   CHOL 193 03/18/2021 0922   TRIG 127 03/18/2021 0922   HDL 72 03/18/2021 0922   CHOLHDL 2.7 03/18/2021 0922   LDLCALC 99 03/18/2021 0922    Physical Exam:    VS:  BP 132/62    Pulse 64    Ht 4' 11.6" (1.514 m)    Wt 157 lb 3.2 oz (71.3 kg)    SpO2 97%    BMI 31.11 kg/m     Wt Readings from Last 3 Encounters:  07/14/21 157 lb 3.2 oz (71.3 kg)  05/04/21 155 lb 12.8 oz (70.7 kg)  04/08/21 152 lb 3.2 oz (69 kg)     GEN: Patient is in no acute distress HEENT: Normal NECK: No JVD; No carotid bruits LYMPHATICS: No lymphadenopathy CARDIAC: Hear sounds regular, 2/6 systolic murmur at the apex. RESPIRATORY:  Clear to auscultation without rales, wheezing or rhonchi  ABDOMEN: Soft, non-tender, non-distended MUSCULOSKELETAL:  No edema; No deformity  SKIN:  Warm and dry NEUROLOGIC:  Alert and oriented x 3 PSYCHIATRIC:  Normal affect   Signed, Garwin Brothers, MD  07/14/2021 11:18 AM    [facility]

## 2021-07-14 NOTE — Patient Instructions (Signed)
Medication Instructions:  Your physician has recommended you make the following change in your medication:   Decrease your amiodarone to 100 mg daily.  *If you need a refill on your cardiac medications before your next appointment, please call your pharmacy*   Lab Work: None ordered If you have labs (blood work) drawn today and your tests are completely normal, you will receive your results only by: MyChart Message (if you have MyChart) OR A paper copy in the mail If you have any lab test that is abnormal or we need to change your treatment, we will call you to review the results.   Testing/Procedures: None ordered   Follow-Up: At [facility], you and your health needs are our priority.  As part of our continuing mission to provide you with exceptional heart care, we have created designated Provider Care Teams.  These Care Teams include your primary Cardiologist (physician) and Advanced Practice Providers (APPs -  Physician Assistants and Nurse Practitioners) who all work together to provide you with the care you need, when you need it.  We recommend signing up for the patient portal called "MyChart".  Sign up information is provided on this After Visit Summary.  MyChart is used to connect with patients for Virtual Visits (Telemedicine).  Patients are able to view lab/test results, encounter notes, upcoming appointments, etc.  Non-urgent messages can be sent to your provider as well.   To learn more about what you can do with MyChart, go to [email or web address].    Your next appointment:   4 month(s)  The format for your next appointment:   In Person  Provider:   Belva Crome, MD   Other Instructions NA

## 2021-07-14 NOTE — Addendum Note (Signed)
Addended by: Truddie Hidden on: 07/14/2021 11:48 AM   Modules accepted: Orders

## 2021-07-18 ENCOUNTER — Other Ambulatory Visit: Payer: Self-pay

## 2021-07-18 DIAGNOSIS — E782 Mixed hyperlipidemia: Secondary | ICD-10-CM

## 2021-07-18 MED ORDER — SIMVASTATIN 40 MG PO TABS: 40.0000 mg | ORAL_TABLET | Freq: Every day | ORAL | 2 refills | Status: DC

## 2021-07-19 ENCOUNTER — Other Ambulatory Visit: Payer: Self-pay | Admitting: Legal Medicine

## 2021-07-19 ENCOUNTER — Telehealth: Payer: Self-pay

## 2021-07-19 DIAGNOSIS — E782 Mixed hyperlipidemia: Secondary | ICD-10-CM

## 2021-07-19 MED ORDER — ATORVASTATIN CALCIUM 40 MG PO TABS: 40.0000 mg | ORAL_TABLET | Freq: Every day | ORAL | 3 refills | Status: DC

## 2021-07-19 NOTE — Telephone Encounter (Signed)
Patient called as she received letter stating simvastatin will not be paid for by insurance when taken with amiodarone. If this is ok per provider there may be paperwork needed per pt.   Shannon Martin, [place] 07/19/21 8:28 AM

## 2021-07-19 NOTE — Telephone Encounter (Signed)
Made pt aware.  Shannon Martin, [place] 07/19/21 10:10 AM

## 2021-07-19 NOTE — Telephone Encounter (Signed)
Change from simvastain to atorvastatin  lp

## 2021-07-28 ENCOUNTER — Telehealth: Payer: Self-pay | Admitting: Cardiology

## 2021-07-28 NOTE — Telephone Encounter (Signed)
Pt states that she does not have a ride to come for a nurse visit or labs. How do you advise?

## 2021-07-28 NOTE — Telephone Encounter (Signed)
Pt c/o medication issue:  1. Name of Medication: Amiodarone  2. How are you currently taking this medication (dosage and times per day)?  1 time a day in the morning  3. Are you having a reaction (difficulty breathing--STAT)?   4. What is your medication issue? Makes her real dizzy- she stopped taking them 4 or 5 days ago

## 2021-07-28 NOTE — Telephone Encounter (Signed)
Spoke to the patient just now and she let me know that she stopped taking her amiodarone on Saturday (5 days ago). She states it was making her dizzy to the point that she was getting nervous walking to the bathroom alone. She states that she does not get dizzy now. Her blood pressure this morning was 181/84 and heart rate of 58 bpm. She just wanted to make Dr. Tomie China aware that she stopped this medication and wants his recommendations from here.

## 2021-07-28 NOTE — Telephone Encounter (Signed)
Pt will callback with time she can come for nurse visit.

## 2021-07-29 ENCOUNTER — Ambulatory Visit (INDEPENDENT_AMBULATORY_CARE_PROVIDER_SITE_OTHER): Payer: Medicare HMO

## 2021-07-29 ENCOUNTER — Other Ambulatory Visit: Payer: Self-pay

## 2021-07-29 VITALS — BP 160/72 | HR 54 | Resp 18 | Ht 59.6 in | Wt 160.2 lb

## 2021-07-29 DIAGNOSIS — Z79899 Other long term (current) drug therapy: Secondary | ICD-10-CM

## 2021-07-29 DIAGNOSIS — I480 Paroxysmal atrial fibrillation: Secondary | ICD-10-CM | POA: Diagnosis not present

## 2021-07-29 NOTE — Progress Notes (Signed)
Reason for visit: EKG, VS and labs  Name of MD requesting visit: Revankar  H&P: PAF  ROS related to problem: Pt stopped her Amiodarone due to dizziness.  Assessment and plan per MD: Dr. Tomie China will review and will call pt with recommendation.

## 2021-07-29 NOTE — Telephone Encounter (Signed)
Pt is coming in at 10:30 for her nurse visit.

## 2021-07-30 LAB — BASIC METABOLIC PANEL
BUN/Creatinine Ratio: 10 — ABNORMAL LOW (ref 12–28)
BUN: 10 mg/dL (ref 8–27)
CO2: 26 mmol/L (ref 20–29)
Calcium: 9.1 mg/dL (ref 8.7–10.3)
Chloride: 98 mmol/L (ref 96–106)
Creatinine, Ser: 0.98 mg/dL (ref 0.57–1.00)
Glucose: 99 mg/dL (ref 70–99)
Potassium: 3.5 mmol/L (ref 3.5–5.2)
Sodium: 135 mmol/L (ref 134–144)
eGFR: 59 mL/min/{1.73_m2} — ABNORMAL LOW (ref >59)

## 2021-07-30 LAB — HEPATIC FUNCTION PANEL
ALT: 20 IU/L (ref 0–32)
AST: 26 IU/L (ref 0–40)
Albumin: 4.2 g/dL (ref 3.7–4.7)
Alkaline Phosphatase: 72 IU/L (ref 44–121)
Bilirubin Total: 0.2 mg/dL (ref 0.0–1.2)
Bilirubin, Direct: 0.1 mg/dL (ref 0.00–0.40)
Total Protein: 5.9 g/dL — ABNORMAL LOW (ref 6.0–8.5)

## 2021-08-11 DIAGNOSIS — N3281 Overactive bladder: Secondary | ICD-10-CM | POA: Diagnosis not present

## 2021-08-11 DIAGNOSIS — R339 Retention of urine, unspecified: Secondary | ICD-10-CM | POA: Diagnosis not present

## 2021-08-11 DIAGNOSIS — N952 Postmenopausal atrophic vaginitis: Secondary | ICD-10-CM | POA: Diagnosis not present

## 2021-09-01 ENCOUNTER — Other Ambulatory Visit: Payer: Self-pay | Admitting: Legal Medicine

## 2021-09-01 DIAGNOSIS — M159 Polyosteoarthritis, unspecified: Secondary | ICD-10-CM

## 2021-09-16 ENCOUNTER — Other Ambulatory Visit: Payer: Self-pay

## 2021-09-16 ENCOUNTER — Encounter: Payer: Self-pay | Admitting: Legal Medicine

## 2021-09-16 ENCOUNTER — Ambulatory Visit (INDEPENDENT_AMBULATORY_CARE_PROVIDER_SITE_OTHER): Payer: Medicare HMO | Admitting: Legal Medicine

## 2021-09-16 VITALS — BP 130/70 | HR 62 | Temp 98.5°F | Resp 15 | Ht 59.6 in | Wt 156.0 lb

## 2021-09-16 DIAGNOSIS — J4540 Moderate persistent asthma, uncomplicated: Secondary | ICD-10-CM

## 2021-09-16 DIAGNOSIS — Z1159 Encounter for screening for other viral diseases: Secondary | ICD-10-CM | POA: Diagnosis not present

## 2021-09-16 DIAGNOSIS — Z6830 Body mass index (BMI) 30.0-30.9, adult: Secondary | ICD-10-CM | POA: Diagnosis not present

## 2021-09-16 DIAGNOSIS — I10 Essential (primary) hypertension: Secondary | ICD-10-CM | POA: Diagnosis not present

## 2021-09-16 DIAGNOSIS — R413 Other amnesia: Secondary | ICD-10-CM | POA: Insufficient documentation

## 2021-09-16 DIAGNOSIS — Z23 Encounter for immunization: Secondary | ICD-10-CM

## 2021-09-16 DIAGNOSIS — M1990 Unspecified osteoarthritis, unspecified site: Secondary | ICD-10-CM

## 2021-09-16 DIAGNOSIS — I480 Paroxysmal atrial fibrillation: Secondary | ICD-10-CM | POA: Diagnosis not present

## 2021-09-16 DIAGNOSIS — K219 Gastro-esophageal reflux disease without esophagitis: Secondary | ICD-10-CM

## 2021-09-16 DIAGNOSIS — G43009 Migraine without aura, not intractable, without status migrainosus: Secondary | ICD-10-CM | POA: Diagnosis not present

## 2021-09-16 DIAGNOSIS — E782 Mixed hyperlipidemia: Secondary | ICD-10-CM | POA: Diagnosis not present

## 2021-09-16 DIAGNOSIS — N3281 Overactive bladder: Secondary | ICD-10-CM | POA: Diagnosis not present

## 2021-09-16 HISTORY — DX: Other amnesia: R41.3

## 2021-09-16 HISTORY — DX: Body mass index (BMI) 30.0-30.9, adult: Z68.30

## 2021-09-16 MED ORDER — GEMTESA 75 MG PO TABS: 1.0000 | ORAL_TABLET | Freq: Every day | ORAL | 2 refills | Status: DC

## 2021-09-16 MED ORDER — TRAMADOL HCL 50 MG PO TABS: 50.0000 mg | ORAL_TABLET | Freq: Three times a day (TID) | ORAL | 0 refills | Status: AC | PRN

## 2021-09-16 NOTE — Progress Notes (Signed)
? ?Subjective:  ?Patient ID: Shannon Martin, female    DOB: 11/27/1940  Age: 81 y.o. MRN: [id number] ? ?Chief Complaint  ?Patient presents with  ? Hypertension  ? Atrial Fibrillation  ? Hyperlipidemia  ? ? ?HPI ?Patient presents for follow up of hypertension.  Patient tolerating Aspirin 81 mg  daily.  Patient was diagnosed with hypertension and has been treated for hypertension for 10 years.Patient is working on maintaining diet and exercise regimen and follows up as directed.  ? ?Patient presents with hyperlipidemia.  Compliance with treatment has been good; patient takes medicines as directed, maintains low cholesterol diet, follows up as directed, and maintains exercise regimen.  Patient is using Atorvastatin 40 mg  daily without problems.  ? ?Afib: She is taking Eliquis 5 mg twice a day. ? ?Asthma: Patient is on Symbicort 80-4.5 mcg 2 puffs twice a day and albuterol 108 mcg inhaler 2 puffs every 6 hours prn ? ?We discuss loss of height with age, needs new glasses,  ? ?Current Outpatient Medications on File Prior to Visit  ?Medication Sig Dispense Refill  ? albuterol (PROVENTIL HFA;VENTOLIN HFA) 108 (90 Base) MCG/ACT inhaler Inhale two puffs every four to six hours as needed for cough or wheeze. 1 Inhaler 1  ? apixaban (ELIQUIS) 5 MG TABS tablet Take 1 tablet (5 mg total) by mouth 2 (two) times daily. 180 tablet 3  ? aspirin EC 81 MG tablet Take 81 mg by mouth daily.    ? atorvastatin (LIPITOR) 40 MG tablet Take 1 tablet (40 mg total) by mouth daily. 90 tablet 3  ? budesonide-formoterol (SYMBICORT) 80-4.5 MCG/ACT inhaler INHALE 2 PUFFS INTO LUNGS TWICE DAILY, IN THE MORNING AND EVENING 30.6 g 6  ? Calcium Carb-Cholecalciferol (CALCIUM + D3) 600-200 MG-UNIT TABS Take 1 tablet by mouth 2 (two) times daily.    ? celecoxib (CELEBREX) 100 MG capsule TAKE 1 CAPSULE(100 MG) BY MOUTH TWICE DAILY 180 capsule 2  ? Cholecalciferol (D3-1000) 25 MCG (1000 UT) capsule Take 1,000 Units by mouth daily.    ? estradiol (ESTRACE)  0.1 MG/GM vaginal cream Place 1 Applicatorful vaginally at bedtime.    ? famotidine (PEPCID) 40 MG tablet TAKE 1 TABLET(40 MG) BY MOUTH TWICE DAILY 180 tablet 2  ? fluticasone (FLONASE) 50 MCG/ACT nasal spray Can use one spray in each nostril once daily as directed. 16 g 6  ? Hypromellose (ARTIFICIAL TEARS OP) Apply 1 drop to eye as needed for dry eyes.    ? loratadine (CLARITIN) 10 MG tablet Take 10 mg by mouth daily.    ? Multiple Vitamins-Minerals (MULTIVITAMIN PO) Take 1 tablet by mouth daily.    ? tamsulosin (FLOMAX) 0.4 MG CAPS capsule Take 0.4 mg by mouth daily.    ? VITAMIN E PO Take 400 Units by mouth 2 (two) times daily.    ? ?No current facility-administered medications on file prior to visit.  ? ?Past Medical History:  ?Diagnosis Date  ? Allergic rhinitis 02/16/2015  ? BMI 29.0-29.9,adult 11/26/2019  ? Chronic diarrhea 03/09/2020  ? Essential hypertension 12/11/2018  ? 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 GERD (gastroesophageal reflux disease)   ? Iliotibial band syndrome of left side 06/17/2015  ? 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  ? Laryngopharyngeal reflux (LPR) 03/24/2015  ? Left knee pain 10/01/2014  ? Migraine without aura and without status migrainosus, not intractable 07/28/2019  ? Migraines 1943 to present  ? "fairly frequently"; Takes Propranolol (01/12/2015)  ? Mixed hyperlipidemia 12/11/2018  ? 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  ?  Overactive bladder   ? Takes Vesicare  ? PAF (paroxysmal atrial fibrillation) (HCC) 05/04/2021  ? Palpitations 04/08/2021  ? Primary osteoarthritis of left knee 12/03/2014  ? 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 Seborrheic dermatitis 12/21/2020  ? Syncope and collapse 12/11/2018  ? ?Past Surgical History:  ?Procedure Laterality Date  ? BREAST BIOPSY Left ~ 1973  ? CATARACT EXTRACTION W/ INTRAOCULAR LENS  IMPLANT, BILATERAL Bilateral 2003  ? CHEST TUBE INSERTION  January 2015  ? [facility]  ? DILATION AND CURETTAGE OF UTERUS  4-5  ? JOINT REPLACEMENT    ? REPLACEMENT  TOTAL KNEE Left   ? TONSILLECTOMY AND ADENOIDECTOMY  1946; 1947  ? TOTAL KNEE ARTHROPLASTY Left 01/11/2015  ? TOTAL KNEE ARTHROPLASTY Left 01/11/2015  ? Procedure: Left TOTAL KNEE ARTHROPLASTY;  Surgeon: Dannielle HuhSteve Lucey, MD;  Location: [facility] OR;  Service: Orthopedics;  Laterality: Left;  ? VIDEO ASSISTED THORACOSCOPY (VATS)/THOROCOTOMY Left 07/13/2013  ? VATS with insertion of chest tubes  ?  ?Family History  ?Problem Relation Age of Onset  ? Hypertension Mother   ? Diabetes Father   ? Hypertension Maternal Grandmother   ? Hypertension Maternal Grandfather   ? ?Social History  ? ?Socioeconomic History  ? Marital status: Widowed  ?  Spouse name: Not on file  ? Number of children: 1  ? Years of education: Not on file  ? Highest education level: Not on file  ?Occupational History  ? Not on file  ?Tobacco Use  ? Smoking status: Never  ? Smokeless tobacco: Never  ?Vaping Use  ? Vaping Use: Never used  ?Substance and Sexual Activity  ? Alcohol use: Yes  ?  Alcohol/week: 2.0 standard drinks  ?  Types: 2 Glasses of wine per week  ? Drug use: No  ? Sexual activity: Not Currently  ?Other Topics Concern  ? Not on file  ?Social History Narrative  ? Not on file  ? ?Social Determinants of Health  ? ?Financial Resource Strain: Not on file  ?Food Insecurity: No Food Insecurity  ? Worried About Programme researcher, broadcasting/film/videounning Out of Food in the Last Year: Never true  ? Ran Out of Food in the Last Year: Never true  ?Transportation Needs: No Transportation Needs  ? Lack of Transportation (Medical): No  ? Lack of Transportation (Non-Medical): No  ?Physical Activity: Not on file  ?Stress: Not on file  ?Social Connections: Not on file  ? ? ?Review of Systems  ?Constitutional:  Negative for chills, fatigue and fever.  ?HENT:  Negative for congestion, ear pain and sore throat.   ?Eyes:  Negative for visual disturbance.  ?Respiratory:  Negative for cough and shortness of breath.   ?Cardiovascular:  Negative for chest pain and palpitations.  ?Gastrointestinal:  Negative for  abdominal pain, constipation, diarrhea, nausea and vomiting.  ?Endocrine: Negative for polydipsia, polyphagia and polyuria.  ?Genitourinary:  Negative for difficulty urinating and dysuria.  ?Musculoskeletal:  Negative for arthralgias, back pain and myalgias.  ?Skin:  Negative for rash.  ?Neurological:  Negative for headaches.  ?Psychiatric/Behavioral:  Negative for dysphoric mood. The patient is not nervous/anxious.   ? ? ?Objective:  ?BP 130/70   Pulse 62   Temp 98.5 ?F (36.9 ?C)   Resp 15   Ht 4' 11.6" (1.514 m)   Wt 156 lb (70.8 kg)   SpO2 99%   BMI 30.88 kg/m?  ? ? ?  09/16/2021  ?  8:13 AM 07/29/2021  ? 11:05 AM 07/14/2021  ? 11:08  AM  ?BP/Weight  ?Systolic BP 130 160 132  ?Diastolic BP 70 72 62  ?Wt. (Lbs) 156 160.2 157.2  ?BMI 30.88 kg/m2 31.71 kg/m2 31.11 kg/m2  ? ? ?Physical Exam ?Vitals reviewed.  ?Constitutional:   ?   General: She is not in acute distress. ?   Appearance: Normal appearance.  ?HENT:  ?   Head: Normocephalic.  ?   Right Ear: Tympanic membrane normal.  ?   Left Ear: Tympanic membrane normal.  ?   Nose: Nose normal.  ?   Mouth/Throat:  ?   Mouth: Mucous membranes are moist.  ?Eyes:  ?   Extraocular Movements: Extraocular movements intact.  ?   Conjunctiva/sclera: Conjunctivae normal.  ?   Pupils: Pupils are equal, round, and reactive to light.  ?Cardiovascular:  ?   Rate and Rhythm: Normal rate and regular rhythm.  ?   Pulses: Normal pulses.  ?   Heart sounds: Normal heart sounds. No murmur heard. ?  No gallop.  ?Pulmonary:  ?   Effort: Pulmonary effort is normal. No respiratory distress.  ?   Breath sounds: Normal breath sounds. No wheezing.  ?Abdominal:  ?   General: Abdomen is flat. Bowel sounds are normal. There is no distension.  ?   Palpations: Abdomen is soft.  ?   Tenderness: There is no abdominal tenderness.  ?Musculoskeletal:     ?   General: Normal range of motion.  ?   Cervical back: Normal range of motion.  ?   Right lower leg: No edema.  ?   Left lower leg: No edema.   ?Skin: ?   General: Skin is warm.  ?   Capillary Refill: Capillary refill takes less than 2 seconds.  ?Neurological:  ?   General: No focal deficit present.  ?   Mental Status: She is alert and oriented to person, p

## 2021-09-17 LAB — LIPID PANEL
Chol/HDL Ratio: 2.3 ratio (ref 0.0–4.4)
Cholesterol, Total: 185 mg/dL (ref 100–199)
HDL: 82 mg/dL (ref >39)
LDL Chol Calc (NIH): 92 mg/dL (ref 0–99)
Triglycerides: 60 mg/dL (ref 0–149)
VLDL Cholesterol Cal: 11 mg/dL (ref 5–40)

## 2021-09-17 LAB — COMPREHENSIVE METABOLIC PANEL
ALT: 10 IU/L (ref 0–32)
AST: 20 IU/L (ref 0–40)
Albumin/Globulin Ratio: 2.5 — ABNORMAL HIGH (ref 1.2–2.2)
Albumin: 4.5 g/dL (ref 3.7–4.7)
Alkaline Phosphatase: 80 IU/L (ref 44–121)
BUN/Creatinine Ratio: 21 (ref 12–28)
BUN: 21 mg/dL (ref 8–27)
Bilirubin Total: 0.3 mg/dL (ref 0.0–1.2)
CO2: 23 mmol/L (ref 20–29)
Calcium: 9.9 mg/dL (ref 8.7–10.3)
Chloride: 100 mmol/L (ref 96–106)
Creatinine, Ser: 1.01 mg/dL — ABNORMAL HIGH (ref 0.57–1.00)
Globulin, Total: 1.8 g/dL (ref 1.5–4.5)
Glucose: 94 mg/dL (ref 70–99)
Potassium: 4.1 mmol/L (ref 3.5–5.2)
Sodium: 138 mmol/L (ref 134–144)
Total Protein: 6.3 g/dL (ref 6.0–8.5)
eGFR: 57 mL/min/{1.73_m2} — ABNORMAL LOW (ref >59)

## 2021-09-17 LAB — CBC WITH DIFFERENTIAL/PLATELET
Basophils Absolute: 0.1 10*3/uL (ref 0.0–0.2)
Basos: 1 %
EOS (ABSOLUTE): 0.2 10*3/uL (ref 0.0–0.4)
Eos: 4 %
Hematocrit: 33.6 % — ABNORMAL LOW (ref 34.0–46.6)
Hemoglobin: 11.3 g/dL (ref 11.1–15.9)
Immature Grans (Abs): 0 10*3/uL (ref 0.0–0.1)
Immature Granulocytes: 0 %
Lymphocytes Absolute: 1.6 10*3/uL (ref 0.7–3.1)
Lymphs: 24 %
MCH: 29.7 pg (ref 26.6–33.0)
MCHC: 33.6 g/dL (ref 31.5–35.7)
MCV: 88 fL (ref 79–97)
Monocytes Absolute: 0.6 10*3/uL (ref 0.1–0.9)
Monocytes: 9 %
Neutrophils Absolute: 3.9 10*3/uL (ref 1.4–7.0)
Neutrophils: 62 %
Platelets: 247 10*3/uL (ref 150–450)
RBC: 3.8 x10E6/uL (ref 3.77–5.28)
RDW: 14 % (ref 11.7–15.4)
WBC: 6.3 10*3/uL (ref 3.4–10.8)

## 2021-09-17 LAB — CARDIOVASCULAR RISK ASSESSMENT

## 2021-09-17 LAB — HEPATITIS C ANTIBODY: Hep C Virus Ab: NONREACTIVE

## 2021-09-18 NOTE — Progress Notes (Signed)
Kidney tests stage 3a, liver tests normal, CBC normal, Hepatitis C negative, Cholesterol normal

## 2021-09-19 DIAGNOSIS — R339 Retention of urine, unspecified: Secondary | ICD-10-CM | POA: Diagnosis not present

## 2021-09-19 DIAGNOSIS — N3281 Overactive bladder: Secondary | ICD-10-CM | POA: Diagnosis not present

## 2021-09-19 DIAGNOSIS — N952 Postmenopausal atrophic vaginitis: Secondary | ICD-10-CM | POA: Diagnosis not present

## 2021-10-04 ENCOUNTER — Encounter: Payer: Self-pay | Admitting: Legal Medicine

## 2021-10-04 ENCOUNTER — Ambulatory Visit (INDEPENDENT_AMBULATORY_CARE_PROVIDER_SITE_OTHER): Payer: Medicare HMO | Admitting: Legal Medicine

## 2021-10-04 VITALS — BP 112/80 | HR 87 | Temp 98.2°F | Ht 59.0 in | Wt 156.8 lb

## 2021-10-04 DIAGNOSIS — J45901 Unspecified asthma with (acute) exacerbation: Secondary | ICD-10-CM

## 2021-10-04 DIAGNOSIS — J4541 Moderate persistent asthma with (acute) exacerbation: Secondary | ICD-10-CM | POA: Diagnosis not present

## 2021-10-04 DIAGNOSIS — R059 Cough, unspecified: Secondary | ICD-10-CM

## 2021-10-04 HISTORY — DX: Unspecified asthma with (acute) exacerbation: J45.901

## 2021-10-04 LAB — POC COVID19 BINAXNOW: SARS Coronavirus 2 Ag: NEGATIVE

## 2021-10-04 MED ORDER — TRIAMCINOLONE ACETONIDE 40 MG/ML IJ SUSP: 60.0000 mg | Freq: Once | INTRAMUSCULAR | Status: DC

## 2021-10-04 MED ORDER — AZITHROMYCIN 250 MG PO TABS: ORAL_TABLET | ORAL | 0 refills | Status: AC

## 2021-10-04 NOTE — Progress Notes (Signed)
? ?Acute Office Visit ? ?Subjective:  ? ? Patient ID: Shannon Martin, female    DOB: 1941-04-14, 81 y.o.   MRN: [id number] ? ?Chief Complaint  ?Patient presents with  ? Cough  ? ? ?HPI: ?Patient is in today for sneezing and coughing since yesterday. No fever or chills, no aching.  Sinus congestion.Asthma worsening. ? ?Past Medical History:  ?Diagnosis Date  ? Allergic rhinitis 02/16/2015  ? BMI 29.0-29.9,adult 11/26/2019  ? Chronic diarrhea 03/09/2020  ? Essential hypertension 12/11/2018  ? 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 GERD (gastroesophageal reflux disease)   ? Iliotibial band syndrome of left side 06/17/2015  ? 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  ? Laryngopharyngeal reflux (LPR) 03/24/2015  ? Left knee pain 10/01/2014  ? Migraine without aura and without status migrainosus, not intractable 07/28/2019  ? Migraines 1943 to present  ? "fairly frequently"; Takes Propranolol (01/12/2015)  ? Mixed hyperlipidemia 12/11/2018  ? 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  ? Overactive bladder   ? Takes Vesicare  ? PAF (paroxysmal atrial fibrillation) (HCC) 05/04/2021  ? Palpitations 04/08/2021  ? Primary osteoarthritis of left knee 12/03/2014  ? 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 Seborrheic dermatitis 12/21/2020  ? Syncope and collapse 12/11/2018  ? ? ?Past Surgical History:  ?Procedure Laterality Date  ? BREAST BIOPSY Left ~ 1973  ? CATARACT EXTRACTION W/ INTRAOCULAR LENS  IMPLANT, BILATERAL Bilateral 2003  ? CHEST TUBE INSERTION  January 2015  ? X 2 at [facility]  ? DILATION AND CURETTAGE OF UTERUS  4-5  ? JOINT REPLACEMENT    ? REPLACEMENT TOTAL KNEE Left   ? TONSILLECTOMY AND ADENOIDECTOMY  1946; 1947  ? TOTAL KNEE ARTHROPLASTY Left 01/11/2015  ? TOTAL KNEE ARTHROPLASTY Left 01/11/2015  ? Procedure: Left TOTAL KNEE ARTHROPLASTY;  Surgeon: Dannielle Huh, MD;  Location: [facility] OR;  Service: Orthopedics;  Laterality: Left;  ? VIDEO ASSISTED THORACOSCOPY (VATS)/THOROCOTOMY Left 07/13/2013  ? VATS with insertion of chest tubes  ? ? ?Family  History  ?Problem Relation Age of Onset  ? Hypertension Mother   ? Diabetes Father   ? Hypertension Maternal Grandmother   ? Hypertension Maternal Grandfather   ? ? ?Social History  ? ?Socioeconomic History  ? Marital status: Widowed  ?  Spouse name: Not on file  ? Number of children: 1  ? Years of education: Not on file  ? Highest education level: Not on file  ?Occupational History  ? Not on file  ?Tobacco Use  ? Smoking status: Never  ? Smokeless tobacco: Never  ?Vaping Use  ? Vaping Use: Never used  ?Substance and Sexual Activity  ? Alcohol use: Yes  ?  Alcohol/week: 2.0 standard drinks  ?  Types: 2 Glasses of wine per week  ? Drug use: No  ? Sexual activity: Not Currently  ?Other Topics Concern  ? Not on file  ?Social History Narrative  ? Not on file  ? ?Social Determinants of Health  ? ?Financial Resource Strain: Not on file  ?Food Insecurity: No Food Insecurity  ? Worried About Programme researcher, broadcasting/film/video in the Last Year: Never true  ? Ran Out of Food in the Last Year: Never true  ?Transportation Needs: No Transportation Needs  ? Lack of Transportation (Medical): No  ? Lack of Transportation (Non-Medical): No  ?Physical Activity: Not on file  ?Stress: Not on file  ?Social Connections: Not on file  ?Intimate Partner Violence: Not on file  ? ? ?Outpatient Medications Prior to Visit  ?Medication Sig Dispense  Refill  ? albuterol (PROVENTIL HFA;VENTOLIN HFA) 108 (90 Base) MCG/ACT inhaler Inhale two puffs every four to six hours as needed for cough or wheeze. 1 Inhaler 1  ? apixaban (ELIQUIS) 5 MG TABS tablet Take 1 tablet (5 mg total) by mouth 2 (two) times daily. 180 tablet 3  ? aspirin EC 81 MG tablet Take 81 mg by mouth daily.    ? atorvastatin (LIPITOR) 40 MG tablet Take 1 tablet (40 mg total) by mouth daily. 90 tablet 3  ? budesonide-formoterol (SYMBICORT) 80-4.5 MCG/ACT inhaler INHALE 2 PUFFS INTO LUNGS TWICE DAILY, IN THE MORNING AND EVENING 30.6 g 6  ? Calcium Carb-Cholecalciferol (CALCIUM + D3) 600-200 MG-UNIT  TABS Take 1 tablet by mouth 2 (two) times daily.    ? celecoxib (CELEBREX) 100 MG capsule TAKE 1 CAPSULE(100 MG) BY MOUTH TWICE DAILY 180 capsule 2  ? Cholecalciferol (D3-1000) 25 MCG (1000 UT) capsule Take 1,000 Units by mouth daily.    ? estradiol (ESTRACE) 0.1 MG/GM vaginal cream Place 1 Applicatorful vaginally at bedtime.    ? famotidine (PEPCID) 40 MG tablet TAKE 1 TABLET(40 MG) BY MOUTH TWICE DAILY 180 tablet 2  ? fluticasone (FLONASE) 50 MCG/ACT nasal spray Can use one spray in each nostril once daily as directed. 16 g 6  ? GEMTESA 75 MG TABS Take 1 tablet by mouth daily. 90 tablet 2  ? Hypromellose (ARTIFICIAL TEARS OP) Apply 1 drop to eye as needed for dry eyes.    ? loratadine (CLARITIN) 10 MG tablet Take 10 mg by mouth daily.    ? Multiple Vitamins-Minerals (MULTIVITAMIN PO) Take 1 tablet by mouth daily.    ? tamsulosin (FLOMAX) 0.4 MG CAPS capsule Take 0.4 mg by mouth daily.    ? VITAMIN E PO Take 400 Units by mouth 2 (two) times daily.    ? ?No facility-administered medications prior to visit.  ? ? ?Allergies  ?Allergen Reactions  ? Tetanus Toxoids Swelling and Other (See Comments)  ?  Swelling around throat and jaws  ? Bee Venom Swelling and Other (See Comments)  ?  Crusty area on skin  ? Hornet Venom Swelling  ?  Crusty area on skin  ? Dilaudid [Hydromorphone] Nausea And Vomiting  ?  Causes nausea and vomiting  ? Erythromycin Nausea And Vomiting  ? Fiorinal [Butalbital-Aspirin-Caffeine]   ?  Altered mental status  ? Amoxicillin Other (See Comments)  ?  Upset stomach  ? Doxycycline Other (See Comments)  ?  Upset stomach  ? Latex Other (See Comments)  ?  Skin will crack open and bleed  ? Neomycin Other (See Comments)  ?  Irritates skin  ? Penicillins Itching, Swelling and Rash  ? ? ?Review of Systems  ?Constitutional:  Negative for appetite change, chills, fatigue and fever.  ?HENT:  Positive for sneezing. Negative for congestion, ear discharge, ear pain, rhinorrhea, sinus pressure and sore throat.    ?Eyes:  Negative for visual disturbance.  ?Respiratory:  Positive for cough. Negative for chest tightness, shortness of breath and wheezing.   ?Cardiovascular:  Negative for chest pain, palpitations and leg swelling.  ?Gastrointestinal:  Negative for abdominal pain, diarrhea, nausea and vomiting.  ?Endocrine: Negative for polydipsia, polyphagia and polyuria.  ?Genitourinary:  Negative for difficulty urinating, dysuria, frequency, hematuria, menstrual problem, urgency, vaginal bleeding, vaginal discharge and vaginal pain.  ?Musculoskeletal:  Negative for back pain, gait problem, joint swelling, myalgias and neck pain.  ?Skin: Negative.   ?Neurological:  Negative for dizziness, seizures, syncope, weakness,  numbness and headaches.  ?Psychiatric/Behavioral:  Negative for agitation, confusion, hallucinations, sleep disturbance and suicidal ideas. The patient is not nervous/anxious.   ? ?   ?Objective:  ?  ?Physical Exam ?Vitals reviewed.  ?Constitutional:   ?   Appearance: She is ill-appearing.  ?HENT:  ?   Head: Normocephalic.  ?   Right Ear: Tympanic membrane normal.  ?   Left Ear: Tympanic membrane normal.  ?   Nose: Congestion present.  ?   Mouth/Throat:  ?   Mouth: Mucous membranes are moist.  ?   Pharynx: Oropharynx is clear.  ?Eyes:  ?   Extraocular Movements: Extraocular movements intact.  ?   Conjunctiva/sclera: Conjunctivae normal.  ?   Pupils: Pupils are equal, round, and reactive to light.  ?Cardiovascular:  ?   Rate and Rhythm: Normal rate and regular rhythm.  ?   Pulses: Normal pulses.  ?   Heart sounds: Normal heart sounds. No murmur heard. ?  No gallop.  ?Pulmonary:  ?   Effort: Pulmonary effort is normal.  ?   Breath sounds: Wheezing present.  ?Abdominal:  ?   General: Abdomen is flat. Bowel sounds are normal.  ?Musculoskeletal:  ?   Cervical back: Normal range of motion.  ?Skin: ?   General: Skin is warm.  ?   Capillary Refill: Capillary refill takes less than 2 seconds.  ?Neurological:  ?   General:  No focal deficit present.  ?   Mental Status: She is alert and oriented to person, place, and time.  ? ? ?BP 112/80   Pulse 87   Temp 98.2 ?F (36.8 ?C)   Ht 4\' 11"  (1.499 m)   Wt 156 lb 12.8 oz (71.1 kg)   Sp

## 2021-10-06 ENCOUNTER — Ambulatory Visit: Payer: Medicare Other

## 2021-10-11 ENCOUNTER — Ambulatory Visit (INDEPENDENT_AMBULATORY_CARE_PROVIDER_SITE_OTHER): Payer: Medicare HMO

## 2021-10-11 VITALS — BP 124/68 | HR 79 | Resp 16 | Ht 59.0 in | Wt 156.2 lb

## 2021-10-11 DIAGNOSIS — Z0000 Encounter for general adult medical examination without abnormal findings: Secondary | ICD-10-CM | POA: Diagnosis not present

## 2021-10-11 NOTE — Patient Instructions (Signed)

## 2021-10-11 NOTE — Progress Notes (Signed)
? ?Subjective:  ? Shannon Martin is a 81 y.o. female who presents for Medicare Annual (Subsequent) preventive examination.  This wellness visit is conducted by a nurse.  The patient's medications were reviewed and reconciled since the patient's last visit.  History details were provided by the patient.  The history appears to be reliable.   ? ?Patient's last AWV was one year ago.   ?Medical History: Patient history and Family history was reviewed  ?Medications, Allergies, and preventative health maintenance was reviewed and updated. ? ? ?  ? ?   ?Objective:  ?  ?Today's Vitals  ? 10/11/21 0858  ?BP: 124/68  ?Pulse: 79  ?Resp: 16  ?SpO2: 97%  ?Weight: 156 lb 3.2 oz (70.9 kg)  ?Height: 4\' 11"  (1.499 m)  ?PainSc: 0-No pain  ? ?Body mass index is 31.55 kg/m?. ? ? ?  10/05/2020  ? 10:08 AM 01/12/2015  ?  3:02 PM 01/01/2015  ?  1:09 PM  ?Advanced Directives  ?Does Patient Have a Medical Advance Directive? Yes Yes Yes  ?Type of 03/04/2015 of Estate agent Power of [place];Living will Healthcare Power of [place];Living will  ?Does patient want to make changes to medical advance directive? No - Patient declined No - Patient declined   ?Copy of Healthcare Power of Attorney in Chart? No - copy requested Yes Yes  ? ? ?Current Medications (verified) ?Outpatient Encounter Medications as of 10/11/2021  ?Medication Sig  ? albuterol (PROVENTIL HFA;VENTOLIN HFA) 108 (90 Base) MCG/ACT inhaler Inhale two puffs every four to six hours as needed for cough or wheeze.  ? apixaban (ELIQUIS) 5 MG TABS tablet Take 1 tablet (5 mg total) by mouth 2 (two) times daily.  ? aspirin EC 81 MG tablet Take 81 mg by mouth daily.  ? atorvastatin (LIPITOR) 40 MG tablet Take 1 tablet (40 mg total) by mouth daily.  ? budesonide-formoterol (SYMBICORT) 80-4.5 MCG/ACT inhaler INHALE 2 PUFFS INTO LUNGS TWICE DAILY, IN THE MORNING AND EVENING  ? Calcium Carb-Cholecalciferol (CALCIUM + D3) 600-200 MG-UNIT TABS Take 1 tablet by mouth 2  (two) times daily.  ? celecoxib (CELEBREX) 100 MG capsule TAKE 1 CAPSULE(100 MG) BY MOUTH TWICE DAILY  ? Cholecalciferol (D3-1000) 25 MCG (1000 UT) capsule Take 1,000 Units by mouth daily.  ? estradiol (ESTRACE) 0.1 MG/GM vaginal cream Place 1 Applicatorful vaginally at bedtime.  ? famotidine (PEPCID) 40 MG tablet TAKE 1 TABLET(40 MG) BY MOUTH TWICE DAILY  ? fluticasone (FLONASE) 50 MCG/ACT nasal spray Can use one spray in each nostril once daily as directed.  ? GEMTESA 75 MG TABS Take 1 tablet by mouth daily.  ? Hypromellose (ARTIFICIAL TEARS OP) Apply 1 drop to eye as needed for dry eyes.  ? loratadine (CLARITIN) 10 MG tablet Take 10 mg by mouth daily.  ? Multiple Vitamins-Minerals (MULTIVITAMIN PO) Take 1 tablet by mouth daily.  ? tamsulosin (FLOMAX) 0.4 MG CAPS capsule Take 0.4 mg by mouth daily.  ? VITAMIN E PO Take 400 Units by mouth 2 (two) times daily.  ? ?Facility-Administered Encounter Medications as of 10/11/2021  ?Medication  ? triamcinolone acetonide (KENALOG-40) injection 60 mg  ? ? ?Allergies (verified) ?Tetanus toxoids, Bee venom, Hornet venom, Dilaudid [hydromorphone], Erythromycin, Fiorinal [butalbital-aspirin-caffeine], Amoxicillin, Doxycycline, Latex, Neomycin, and Penicillins  ? ?History: ?Past Medical History:  ?Diagnosis Date  ? Allergic rhinitis 02/16/2015  ? BMI 29.0-29.9,adult 11/26/2019  ? Chronic diarrhea 03/09/2020  ? Essential hypertension 12/11/2018  ? 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 GERD (gastroesophageal reflux  disease)   ? Iliotibial band syndrome of left side 06/17/2015  ? 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  ? Laryngopharyngeal reflux (LPR) 03/24/2015  ? Left knee pain 10/01/2014  ? Migraine without aura and without status migrainosus, not intractable 07/28/2019  ? Migraines 1943 to present  ? "fairly frequently"; Takes Propranolol (01/12/2015)  ? Mixed hyperlipidemia 12/11/2018  ? 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  ? Overactive bladder   ? Takes Vesicare  ? PAF (paroxysmal atrial  fibrillation) (HCC) 05/04/2021  ? Palpitations 04/08/2021  ? Primary osteoarthritis of left knee 12/03/2014  ? 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 Seborrheic dermatitis 12/21/2020  ? Syncope and collapse 12/11/2018  ? ?Past Surgical History:  ?Procedure Laterality Date  ? BREAST BIOPSY Left ~ 1973  ? CATARACT EXTRACTION W/ INTRAOCULAR LENS  IMPLANT, BILATERAL Bilateral 2003  ? CHEST TUBE INSERTION  January 2015  ? [facility]  ? DILATION AND CURETTAGE OF UTERUS  4-5  ? JOINT REPLACEMENT    ? REPLACEMENT TOTAL KNEE Left   ? TONSILLECTOMY AND ADENOIDECTOMY  1946; 1947  ? TOTAL KNEE ARTHROPLASTY Left 01/11/2015  ? TOTAL KNEE ARTHROPLASTY Left 01/11/2015  ? Procedure: Left TOTAL KNEE ARTHROPLASTY;  Surgeon: Dannielle HuhSteve Lucey, MD;  Location: [facility] OR;  Service: Orthopedics;  Laterality: Left;  ? VIDEO ASSISTED THORACOSCOPY (VATS)/THOROCOTOMY Left 07/13/2013  ? VATS with insertion of chest tubes  ? ?Family History  ?Problem Relation Age of Onset  ? Hypertension Mother   ? Diabetes Father   ? Hypertension Maternal Grandmother   ? Hypertension Maternal Grandfather   ? ?Social History  ? ?Socioeconomic History  ? Marital status: Widowed  ?  Spouse name: Not on file  ? Number of children: 1  ? Years of education: Not on file  ? Highest education level: Not on file  ?Occupational History  ? Not on file  ?Tobacco Use  ? Smoking status: Never  ? Smokeless tobacco: Never  ?Vaping Use  ? Vaping Use: Never used  ?Substance and Sexual Activity  ? Alcohol use: Yes  ?  Alcohol/week: 2.0 standard drinks  ?  Types: 2 Glasses of wine per week  ? Drug use: No  ? Sexual activity: Not Currently  ?Other Topics Concern  ? Not on file  ?Social History Narrative  ? Not on file  ? ?Social Determinants of Health  ? ?Financial Resource Strain: Not on file  ?Food Insecurity: Not on file  ?Transportation Needs: Not on file  ?Physical Activity: Not on file  ?Stress: Not on file  ?Social Connections: Not on file  ? ? ?Tobacco Counseling ?Counseling  given: Not Answered ? ? ?Clinical Intake: ? ?Pre-visit preparation completed: Yes ? ?Pain : No/denies pain ?Pain Score: 0-No pain ? ?  ? ?Diabetes: No ? ? ?Activities of Daily Living ? ?  10/11/2021  ?  9:07 AM  ?In your present state of health, do you have any difficulty performing the following activities:  ?Hearing? 0  ?Vision? 0  ?Difficulty concentrating or making decisions? 0  ?Walking or climbing stairs? 0  ?Dressing or bathing? 0  ?Doing errands, shopping? 0  ?Preparing Food and eating ? N  ?Using the Toilet? N  ?In the past six months, have you accidently leaked urine? Y  ?Do you have problems with loss of bowel control? N  ?Managing your Medications? N  ?Managing your Finances? N  ?Housekeeping or managing your Housekeeping? N  ? ? ?Patient Care Team: ?Abigail MiyamotoPerry, [name], MD as PCP -  General (Family Medicine) ?Prescilla Sours, FNP as Nurse Practitioner (Family Medicine) ? ?   ?Assessment:  ? This is a routine wellness examination for [facility]. ? ?Hearing/Vision screen ?No results found. ? ?Dietary issues and exercise activities discussed: ?Current Exercise Habits: Home exercise routine, Exercise limited by: None identified ? ? Goals Addressed   ?None ?  ?Depression Screen ? ?  10/04/2021  ? 10:39 AM 09/16/2021  ?  8:44 AM 03/18/2021  ?  9:02 AM 10/05/2020  ? 10:35 AM 11/26/2019  ?  9:26 AM  ?PHQ 2/9 Scores  ?PHQ - 2 Score 0 0 0 0 0  ?PHQ- 9 Score  0 0    ?  ?Fall Risk ? ?  10/04/2021  ? 10:38 AM 10/05/2020  ? 10:13 AM 09/16/2020  ?  9:06 AM 03/09/2020  ? 11:06 AM 03/09/2020  ? 11:05 AM  ?Fall Risk   ?Falls in the past year? 0 0 0 0 0  ?Number falls in past yr: 0 0 0 0 0  ?Injury with Fall? 0 0 0 0 0  ?Risk for fall due to :  No Fall Risks No Fall Risks    ?Follow up  Falls evaluation completed;Education provided;Falls prevention discussed  Falls evaluation completed Falls evaluation completed  ? ? ?FALL RISK PREVENTION PERTAINING TO THE HOME: ? ?Any stairs in or around the home? No  ?If so, are there any without  handrails? No  ?Home free of loose throw rugs in walkways, pet beds, electrical cords, etc? Yes  ?Adequate lighting in your home to reduce risk of falls? Yes  ? ?ASSISTIVE DEVICES UTILIZED TO PREVENT FALLS: ? ?Life aler

## 2021-10-19 ENCOUNTER — Ambulatory Visit (INDEPENDENT_AMBULATORY_CARE_PROVIDER_SITE_OTHER): Payer: Medicare HMO | Admitting: Legal Medicine

## 2021-10-19 ENCOUNTER — Encounter: Payer: Self-pay | Admitting: Legal Medicine

## 2021-10-19 VITALS — BP 124/70 | HR 76 | Temp 98.4°F | Resp 15 | Ht 59.0 in | Wt 155.0 lb

## 2021-10-19 DIAGNOSIS — J4541 Moderate persistent asthma with (acute) exacerbation: Secondary | ICD-10-CM | POA: Diagnosis not present

## 2021-10-19 DIAGNOSIS — R159 Full incontinence of feces: Secondary | ICD-10-CM | POA: Diagnosis not present

## 2021-10-19 NOTE — Progress Notes (Signed)
? ?Subjective:  ?Patient ID: Shannon Martin, female    DOB: 06-Dec-1940  Age: 81 y.o. MRN: [id number] ? ?Chief Complaint  ?Patient presents with  ? Asthma  ? ? ?HPI: follow up ?Patient was seen on 10/04/2021 for exacerbation of Asthma. Dr Marina Goodell prescribed Zpack, and Kenalog shot. She finished the antibiotic. She feels better. No SOB. ?Patient also notes she is starting to have some fecal soiling of her underwear that she is unaware of as long as well as overactive bladder.  She is being treated for that would like to see a gastroenterologist about her fecal incontinence.  She has had no strokes or other neurologic condition that I know of no spinal cord injuries.  We will refer to Dr. Jennye Boroughs ?Current Outpatient Medications on File Prior to Visit  ?Medication Sig Dispense Refill  ? albuterol (PROVENTIL HFA;VENTOLIN HFA) 108 (90 Base) MCG/ACT inhaler Inhale two puffs every four to six hours as needed for cough or wheeze. 1 Inhaler 1  ? apixaban (ELIQUIS) 5 MG TABS tablet Take 1 tablet (5 mg total) by mouth 2 (two) times daily. 180 tablet 3  ? aspirin EC 81 MG tablet Take 81 mg by mouth daily.    ? atorvastatin (LIPITOR) 40 MG tablet Take 1 tablet (40 mg total) by mouth daily. 90 tablet 3  ? budesonide-formoterol (SYMBICORT) 80-4.5 MCG/ACT inhaler INHALE 2 PUFFS INTO LUNGS TWICE DAILY, IN THE MORNING AND EVENING 30.6 g 6  ? Calcium Carb-Cholecalciferol (CALCIUM + D3) 600-200 MG-UNIT TABS Take 1 tablet by mouth 2 (two) times daily.    ? celecoxib (CELEBREX) 100 MG capsule TAKE 1 CAPSULE(100 MG) BY MOUTH TWICE DAILY 180 capsule 2  ? Cholecalciferol (D3-1000) 25 MCG (1000 UT) capsule Take 1,000 Units by mouth daily.    ? estradiol (ESTRACE) 0.1 MG/GM vaginal cream Place 1 Applicatorful vaginally at bedtime.    ? famotidine (PEPCID) 40 MG tablet TAKE 1 TABLET(40 MG) BY MOUTH TWICE DAILY 180 tablet 2  ? fluticasone (FLONASE) 50 MCG/ACT nasal spray Can use one spray in each nostril once daily as directed. 16 g 6  ?  GEMTESA 75 MG TABS Take 1 tablet by mouth daily. 90 tablet 2  ? Hypromellose (ARTIFICIAL TEARS OP) Apply 1 drop to eye as needed for dry eyes.    ? loratadine (CLARITIN) 10 MG tablet Take 10 mg by mouth daily.    ? Multiple Vitamins-Minerals (MULTIVITAMIN PO) Take 1 tablet by mouth daily.    ? tamsulosin (FLOMAX) 0.4 MG CAPS capsule Take 0.4 mg by mouth daily.    ? VITAMIN E PO Take 400 Units by mouth 2 (two) times daily.    ? ?Current Facility-Administered Medications on File Prior to Visit  ?Medication Dose Route Frequency Provider Last Rate Last Admin  ? triamcinolone acetonide (KENALOG-40) injection 60 mg  60 mg Intramuscular Once Abigail Miyamoto, MD      ? ?Past Medical History:  ?Diagnosis Date  ? Allergic rhinitis 02/16/2015  ? BMI 29.0-29.9,adult 11/26/2019  ? Chronic diarrhea 03/09/2020  ? Essential hypertension 12/11/2018  ? 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 GERD (gastroesophageal reflux disease)   ? Iliotibial band syndrome of left side 06/17/2015  ? 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  ? Laryngopharyngeal reflux (LPR) 03/24/2015  ? Left knee pain 10/01/2014  ? Migraine without aura and without status migrainosus, not intractable 07/28/2019  ? Migraines 1943 to present  ? "fairly frequently"; Takes Propranolol (01/12/2015)  ? Mixed hyperlipidemia 12/11/2018  ? 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  ?  Overactive bladder   ? Takes Vesicare  ? PAF (paroxysmal atrial fibrillation) (HCC) 05/04/2021  ? Palpitations 04/08/2021  ? Primary osteoarthritis of left knee 12/03/2014  ? 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 Seborrheic dermatitis 12/21/2020  ? Syncope and collapse 12/11/2018  ? ?Past Surgical History:  ?Procedure Laterality Date  ? BREAST BIOPSY Left ~ 1973  ? CATARACT EXTRACTION W/ INTRAOCULAR LENS  IMPLANT, BILATERAL Bilateral 2003  ? CHEST TUBE INSERTION  January 2015  ? [facility]  ? DILATION AND CURETTAGE OF UTERUS  4-5  ? JOINT REPLACEMENT    ? REPLACEMENT TOTAL KNEE Left   ? TONSILLECTOMY AND  ADENOIDECTOMY  1946; 1947  ? TOTAL KNEE ARTHROPLASTY Left 01/11/2015  ? TOTAL KNEE ARTHROPLASTY Left 01/11/2015  ? Procedure: Left TOTAL KNEE ARTHROPLASTY;  Surgeon: Dannielle HuhSteve Lucey, MD;  Location: [facility] OR;  Service: Orthopedics;  Laterality: Left;  ? VIDEO ASSISTED THORACOSCOPY (VATS)/THOROCOTOMY Left 07/13/2013  ? VATS with insertion of chest tubes  ?  ?Family History  ?Problem Relation Age of Onset  ? Hypertension Mother   ? Diabetes Father   ? Hypertension Maternal Grandmother   ? Hypertension Maternal Grandfather   ? ?Social History  ? ?Socioeconomic History  ? Marital status: Widowed  ?  Spouse name: Not on file  ? Number of children: 1  ? Years of education: Not on file  ? Highest education level: Not on file  ?Occupational History  ? Not on file  ?Tobacco Use  ? Smoking status: Never  ? Smokeless tobacco: Never  ?Vaping Use  ? Vaping Use: Never used  ?Substance and Sexual Activity  ? Alcohol use: Yes  ?  Alcohol/week: 2.0 standard drinks  ?  Types: 2 Glasses of wine per week  ? Drug use: No  ? Sexual activity: Not Currently  ?Other Topics Concern  ? Not on file  ?Social History Narrative  ? Daughter lives nearby, granddaughter lives in [place] - married 3 times, all deceased.  Has not been in contact with her sister in >20 years  ? ?Social Determinants of Health  ? ?Financial Resource Strain: Low Risk   ? Difficulty of Paying Living Expenses: Not hard at all  ?Food Insecurity: No Food Insecurity  ? Worried About Programme researcher, broadcasting/film/videounning Out of Food in the Last Year: Never true  ? Ran Out of Food in the Last Year: Never true  ?Transportation Needs: No Transportation Needs  ? Lack of Transportation (Medical): No  ? Lack of Transportation (Non-Medical): No  ?Physical Activity: Insufficiently Active  ? Days of Exercise per Week: 2 days  ? Minutes of Exercise per Session: 10 min  ?Stress: No Stress Concern Present  ? Feeling of Stress : Not at all  ?Social Connections: Unknown  ? Frequency of Communication with Friends and Family:  More than three times a week  ? Frequency of Social Gatherings with Friends and Family: More than three times a week  ? Attends Religious Services: Not on file  ? Active Member of Clubs or Organizations: No  ? Attends BankerClub or Organization Meetings: Never  ? Marital Status: Widowed  ? ? ?Review of Systems  ?Constitutional:  Negative for chills, fatigue and fever.  ?HENT:  Negative for congestion, ear pain and sore throat.   ?Eyes:  Negative for visual disturbance.  ?Respiratory:  Negative for cough and shortness of breath.   ?Cardiovascular:  Negative for chest pain and palpitations.  ?Gastrointestinal:  Negative for abdominal pain, constipation, diarrhea, nausea  and vomiting.  ?Endocrine: Negative for polydipsia, polyphagia and polyuria.  ?Genitourinary:  Negative for difficulty urinating and dysuria.  ?Musculoskeletal:  Negative for arthralgias, back pain and myalgias.  ?Skin:  Negative for rash.  ?Neurological:  Positive for headaches.  ?Psychiatric/Behavioral:  Negative for dysphoric mood. The patient is not nervous/anxious.   ? ? ?Objective:  ?BP 124/70   Pulse 76   Temp 98.4 ?F (36.9 ?C)   Resp 15   Ht 4\' 11"  (1.499 m)   Wt 155 lb (70.3 kg)   SpO2 99%   BMI 31.31 kg/m?  ? ? ?  10/19/2021  ?  2:34 PM 10/11/2021  ?  8:58 AM 10/04/2021  ? 10:37 AM  ?BP/Weight  ?Systolic BP 124 124 112  ?Diastolic BP 70 68 80  ?Wt. (Lbs) 155 156.2 156.8  ?BMI 31.31 kg/m2 31.55 kg/m2 31.67 kg/m2  ? ? ?Physical Exam ?Vitals reviewed.  ?Constitutional:   ?   Appearance: Normal appearance.  ?HENT:  ?   Head: Normocephalic.  ?   Right Ear: Tympanic membrane normal.  ?   Left Ear: Tympanic membrane normal.  ?   Mouth/Throat:  ?   Mouth: Mucous membranes are moist.  ?   Pharynx: Oropharynx is clear.  ?Eyes:  ?   Extraocular Movements: Extraocular movements intact.  ?   Conjunctiva/sclera: Conjunctivae normal.  ?   Pupils: Pupils are equal, round, and reactive to light.  ?Cardiovascular:  ?   Rate and Rhythm: Normal rate and regular  rhythm.  ?   Pulses: Normal pulses.  ?   Heart sounds: Normal heart sounds. No murmur heard. ?  No gallop.  ?Pulmonary:  ?   Effort: Pulmonary effort is normal. No respiratory distress.  ?   Breath sounds: Normal breath

## 2021-11-11 ENCOUNTER — Encounter: Payer: Self-pay | Admitting: Nurse Practitioner

## 2021-11-11 ENCOUNTER — Ambulatory Visit (INDEPENDENT_AMBULATORY_CARE_PROVIDER_SITE_OTHER): Payer: Medicare HMO | Admitting: Nurse Practitioner

## 2021-11-11 VITALS — BP 144/60 | HR 60 | Temp 98.1°F | Resp 15 | Ht 59.0 in | Wt 155.0 lb

## 2021-11-11 DIAGNOSIS — R600 Localized edema: Secondary | ICD-10-CM

## 2021-11-11 DIAGNOSIS — Z7901 Long term (current) use of anticoagulants: Secondary | ICD-10-CM | POA: Diagnosis not present

## 2021-11-11 DIAGNOSIS — S60222A Contusion of left hand, initial encounter: Secondary | ICD-10-CM | POA: Diagnosis not present

## 2021-11-11 NOTE — Progress Notes (Signed)
Acute Office Visit  Subjective:    Patient ID: Shannon Martin, female    DOB: 12-16-1940, 81 y.o.   MRN: [id number]  Chief Complaint  Patient presents with   swollen left hand    HPI: Patient is in today for left hand swelling and bruising. Denies pain, decreased ROM, or warmth. States she was gardening yesterday. Denies known trauma, injury, or insect bite.Admits to taking Eliquis and Celebrex.  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   Laryngopharyngeal reflux (LPR) 03/24/2015   Left knee pain 10/01/2014   Migraine without aura and without status migrainosus, not intractable 07/28/2019   Migraines 1943 to present   "fairly frequently"; Takes Propranolol (01/12/2015)   Mixed hyperlipidemia 12/11/2018   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   Overactive bladder    Takes Vesicare   PAF (paroxysmal atrial fibrillation) (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   Seborrheic dermatitis 12/21/2020   Syncope and collapse 12/11/2018    Past Surgical History:  Procedure Laterality Date   BREAST BIOPSY Left ~ [place], BILATERAL Bilateral 2003   CHEST TUBE INSERTION  January 2015   X 2 at [place]  4-5   JOINT REPLACEMENT     REPLACEMENT TOTAL KNEE Left    TONSILLECTOMY AND ADENOIDECTOMY  1946; 1947   TOTAL KNEE ARTHROPLASTY Left 01/11/2015   TOTAL KNEE ARTHROPLASTY Left 01/11/2015   Procedure: Left TOTAL KNEE ARTHROPLASTY;  Surgeon: Vickey Huger, MD;  Location: [facility];  Service: Orthopedics;  Laterality: Left;   VIDEO ASSISTED  THORACOSCOPY (VATS)/THOROCOTOMY Left 07/13/2013   VATS with insertion of chest tubes    Family History  Problem Relation Age of Onset   Hypertension Mother    Diabetes Father    Hypertension Maternal Grandmother    Hypertension Maternal Grandfather     Social History   Socioeconomic History   Marital status: Widowed    Spouse name: Not on file   Number of children: 1   Years of education: Not on file   Highest education level: Not on file  Occupational History   Not on file  Tobacco Use   Smoking status: Never   Smokeless tobacco: Never  Vaping Use   Vaping Use: Never used  Substance and Sexual Activity   Alcohol use: Yes    Alcohol/week: 2.0 standard drinks    Types: 2 Glasses of wine per week   Drug use: No   Sexual activity: Not Currently  Other Topics Concern   Not on file  Social History Narrative   Daughter lives nearby, granddaughter lives in [place] - married 3 times, all deceased.  Has not been in contact with her sister in >20 years   Social Determinants of Health   Financial Resource Strain: Low Risk    Difficulty of Paying Living Expenses: Not hard at all  Food Insecurity: No Food Insecurity   Worried About Charity fundraiser in the Last Year: Never true   Ran Out of Food in the Last Year: Never true  Transportation Needs: No Transportation Needs  Lack of Transportation (Medical): No   Lack of Transportation (Non-Medical): No  Physical Activity: Insufficiently Active   Days of Exercise per Week: 2 days   Minutes of Exercise per Session: 10 min  Stress: No Stress Concern Present   Feeling of Stress : Not at all  Social Connections: Unknown   Frequency of Communication with Friends and Family: More than three times a week   Frequency of Social Gatherings with Friends and Family: More than three times a week   Attends Religious Services: Not on file   Active Member of Clubs or Organizations: No   Attends Club or Organization Meetings: Never    Marital Status: Widowed  Intimate Partner Violence: Not on file    Outpatient Medications Prior to Visit  Medication Sig Dispense Refill   albuterol (PROVENTIL HFA;VENTOLIN HFA) 108 (90 Base) MCG/ACT inhaler Inhale two puffs every four to six hours as needed for cough or wheeze. 1 Inhaler 1   apixaban (ELIQUIS) 5 MG TABS tablet Take 1 tablet (5 mg total) by mouth 2 (two) times daily. 180 tablet 3   aspirin EC 81 MG tablet Take 81 mg by mouth daily.     atorvastatin (LIPITOR) 40 MG tablet Take 1 tablet (40 mg total) by mouth daily. 90 tablet 3   budesonide-formoterol (SYMBICORT) 80-4.5 MCG/ACT inhaler INHALE 2 PUFFS INTO LUNGS TWICE DAILY, IN THE MORNING AND EVENING 30.6 g 6   Calcium Carb-Cholecalciferol (CALCIUM + D3) 600-200 MG-UNIT TABS Take 1 tablet by mouth 2 (two) times daily.     celecoxib (CELEBREX) 100 MG capsule TAKE 1 CAPSULE(100 MG) BY MOUTH TWICE DAILY 180 capsule 2   Cholecalciferol (D3-1000) 25 MCG (1000 UT) capsule Take 1,000 Units by mouth daily.     estradiol (ESTRACE) 0.1 MG/GM vaginal cream Place 1 Applicatorful vaginally at bedtime.     famotidine (PEPCID) 40 MG tablet TAKE 1 TABLET(40 MG) BY MOUTH TWICE DAILY 180 tablet 2   fluticasone (FLONASE) 50 MCG/ACT nasal spray Can use one spray in each nostril once daily as directed. 16 g 6   GEMTESA 75 MG TABS Take 1 tablet by mouth daily. 90 tablet 2   Hypromellose (ARTIFICIAL TEARS OP) Apply 1 drop to eye as needed for dry eyes.     loratadine (CLARITIN) 10 MG tablet Take 10 mg by mouth daily.     Multiple Vitamins-Minerals (MULTIVITAMIN PO) Take 1 tablet by mouth daily.     tamsulosin (FLOMAX) 0.4 MG CAPS capsule Take 0.4 mg by mouth daily.     VITAMIN E PO Take 400 Units by mouth 2 (two) times daily.     Facility-Administered Medications Prior to Visit  Medication Dose Route Frequency Provider Last Rate Last Admin   triamcinolone acetonide (KENALOG-40) injection 60 mg  60 mg Intramuscular Once [name], [name], MD         Allergies  Allergen Reactions   Tetanus Toxoids Swelling and Other (See Comments)    Swelling around throat and jaws   Bee Venom Swelling and Other (See Comments)    Crusty area on skin   Hornet Venom Swelling    Crusty area on skin   Dilaudid [Hydromorphone] Nausea And Vomiting    Causes nausea and vomiting   Erythromycin Nausea And Vomiting   Fiorinal [Butalbital-Aspirin-Caffeine]     Altered mental status   Amoxicillin Other (See Comments)    Upset stomach   Doxycycline Other (See Comments)    Upset stomach   Latex Other (See Comments)      Skin will crack open and bleed   Neomycin Other (See Comments)    Irritates skin   Penicillins Itching, Swelling and Rash    Review of Systems  Constitutional:  Negative for chills, fatigue and fever.  HENT:  Negative for congestion, ear pain and sore throat.   Respiratory:  Negative for cough and shortness of breath.   Cardiovascular:  Negative for chest pain and palpitations.  Gastrointestinal:  Negative for abdominal pain, constipation, diarrhea, nausea and vomiting.  Endocrine: Negative for polydipsia, polyphagia and polyuria.  Genitourinary:  Negative for difficulty urinating and dysuria.  Musculoskeletal:  Negative for arthralgias, back pain and myalgias.       Left hand edema  Skin:  Positive for color change (posterior left hand). Negative for rash.  Allergic/Immunologic: Positive for environmental allergies.  Neurological:  Negative for headaches.  Hematological:  Bruises/bleeds easily.  Psychiatric/Behavioral:  Negative for dysphoric mood. The patient is not nervous/anxious.       Objective:   BP (!) 144/60   Pulse 60   Temp 98.1 F (36.7 C)   Resp 15   Ht 4' 11" (1.499 m)   Wt 155 lb (70.3 kg)   SpO2 98%   BMI 31.31 kg/m   Physical Exam Vitals reviewed.  Constitutional:      Appearance: Normal appearance.  Musculoskeletal:        General: Swelling present. No tenderness.     Right hand: Normal.     Left  hand: Swelling present. No tenderness or bony tenderness. Normal range of motion. Normal strength. Normal sensation. There is no disruption of two-point discrimination. Normal capillary refill. Normal pulse.       Arms:       Hands:     Comments: Ecchymosis and edema noted to posterior left hand  Neurological:     Mental Status: She is alert.   BP (!) 144/60   Pulse 60   Temp 98.1 F (36.7 C)   Resp 15   Ht 4' 11" (1.499 m)   Wt 155 lb (70.3 kg)   SpO2 98%   BMI 31.31 kg/m  Wt Readings from Last 3 Encounters:  11/11/21 155 lb (70.3 kg)  10/19/21 155 lb (70.3 kg)  10/11/21 156 lb 3.2 oz (70.9 kg)    Health Maintenance Due  Topic Date Due   Zoster Vaccines- Shingrix (1 of 2) Never done   COVID-19 Vaccine (5 - Booster for Pfizer series) 12/13/2020       Lab Results  Component Value Date   TSH 1.290 04/08/2021   Lab Results  Component Value Date   WBC 6.3 09/16/2021   HGB 11.3 09/16/2021   HCT 33.6 (L) 09/16/2021   MCV 88 09/16/2021   PLT 247 09/16/2021   Lab Results  Component Value Date   NA 138 09/16/2021   K 4.1 09/16/2021   CO2 23 09/16/2021   GLUCOSE 94 09/16/2021   BUN 21 09/16/2021   CREATININE 1.01 (H) 09/16/2021   BILITOT 0.3 09/16/2021   ALKPHOS 80 09/16/2021   AST 20 09/16/2021   ALT 10 09/16/2021   PROT 6.3 09/16/2021   ALBUMIN 4.5 09/16/2021   CALCIUM 9.9 09/16/2021   ANIONGAP 7 01/12/2015   EGFR 57 (L) 09/16/2021   Lab Results  Component Value Date   CHOL 185 09/16/2021   Lab Results  Component Value Date   HDL 82 09/16/2021   Lab Results  Component Value Date   LDLCALC 92 09/16/2021   Lab Results    Component Value Date   TRIG 60 09/16/2021   Lab Results  Component Value Date   CHOLHDL 2.3 09/16/2021        Assessment & Plan:   1. Edema of hand -ice and elevate left hand  2. Contusion of left hand, initial encounter -avoid heavy lifting or strenuous activity with left hand  3. Long term (current) use of  anticoagulants  -seek emergency medical care for signs/symptoms of bleeding   Take over the counter antihistamine Apply ice to left hand Avoid heavy lifting or activity with left hand Seek emergency medical care for worsening symptoms      Follow-up: PRN  An After Visit Summary was printed and given to the patient.  I,  J , NP, have reviewed all documentation for this visit. The documentation on 11/11/21 for the exam, diagnosis, procedures, and orders are all accurate and complete.    Signed,  J , NP [facility] [phone] 

## 2021-11-11 NOTE — Patient Instructions (Signed)
Take over the counter antihistamine Apply ice to left hand Avoid heavy lifting or activity with left hand Seek emergency medical care for worsening symptoms   Hand Contusion A hand contusion is a deep bruise to the hand. Deep bruises can happen when an injury causes bleeding under the skin. The skin over the bruise may be red and then turn blue, purple, or yellow. A minor injury may cause a deep bruise that is painless. A very bad injury may cause a deep bruise that stays painful and swollen for a few weeks. What are the causes? A hard hit or direct force to your hand, such as having a heavy object fall on your hand. What are the signs or symptoms? A swollen hand. Pain and tenderness in your hand. Discoloration of your hand. The area may have redness. Then, it may turn: Blue. Purple. Yellow. How is this treated? Doing RICE therapy. This includes: Rest. Ice. Pressure (compression). Raising (elevating) the injured area. Using an elastic wrap to support your hand. Taking over-the-counter medicines to control pain. Follow these instructions at home: RICE therapy Rest the injured area. If told, put ice on the injured area. Put ice in a plastic bag. Place a towel between your skin and the bag. Leave the ice on for 20 minutes, 2-3 times a day. If told, put light pressure on the injured area using an elastic wrap. Make sure the wrap is not too tight. Take the wrap off and put it back on more loosely if your fingers: Get numb. Turn cold. Turn blue. Remove and put the wrap back on as told by your doctor. Raise (elevate) the injured area above the level of your heart while you are sitting or lying down. General instructions Take over-the-counter and prescription medicines only as told by your doctor. Protect your hand from getting more injured. Keep all follow-up visits as told by your doctor. This is important. Contact a health care provider if: Your symptoms do not get better  after several days of treatment. Your hand or fingers have more: Redness. Swelling. Pain. You have trouble moving the injured area. Medicine does not help your swelling or pain. Get help right away if: You have very bad pain. Your hand or fingers are numb. Your hand or fingers turn: Very light (pale). Blue. Cold. You cannot move your hand or wrist. Your hand feels warm when you touch it. Summary A hand contusion is a deep bruise to the hand. Deep bruises can happen when an injury causes bleeding under the skin. This injury is treated with rest, ice, pressure (compression), and elevation. This information is not intended to replace advice given to you by your health care provider. Make sure you discuss any questions you have with your health care provider. Document Revised: 09/30/2020 Document Reviewed: 09/30/2020 Elsevier Patient Education  2023 [employer].

## 2021-11-14 ENCOUNTER — Encounter: Payer: Self-pay | Admitting: Cardiology

## 2021-11-14 ENCOUNTER — Ambulatory Visit: Payer: Medicare HMO | Admitting: Cardiology

## 2021-11-14 VITALS — BP 150/60 | HR 78 | Ht 59.6 in | Wt 152.6 lb

## 2021-11-14 DIAGNOSIS — I10 Essential (primary) hypertension: Secondary | ICD-10-CM

## 2021-11-14 DIAGNOSIS — I480 Paroxysmal atrial fibrillation: Secondary | ICD-10-CM

## 2021-11-14 DIAGNOSIS — Z6830 Body mass index (BMI) 30.0-30.9, adult: Secondary | ICD-10-CM | POA: Diagnosis not present

## 2021-11-14 DIAGNOSIS — E782 Mixed hyperlipidemia: Secondary | ICD-10-CM | POA: Diagnosis not present

## 2021-11-14 NOTE — Progress Notes (Signed)
Cardiology Office Note:    Date:  11/14/2021   ID:  Shannon Martin, DOB May 14, 1941, MRN [id number]  PCP:  Abigail MiyamotoPerry, [name], MD  Cardiologist:  Garwin Brothersajan R Delphin Funes, MD   Referring MD: Abigail MiyamotoPerry, [name],*    ASSESSMENT:    1. Essential hypertension   2. Mixed hyperlipidemia   3. PAF (paroxysmal atrial fibrillation) (HCC)   4. BMI 30.0-30.9,adult    PLAN:    In order of problems listed above:  Primary prevention stressed with the patient.  Importance of compliance with diet medication stressed and she vocalized understanding. Paroxysmal atrial fibrillation:I discussed with the patient atrial fibrillation, disease process. Management and therapy including rate and rhythm control, anticoagulation benefits and potential risks were discussed extensively with the patient. Patient had multiple questions which were answered to patient's satisfaction. Essential hypertension: Blood pressure stable and diet was emphasized.  She will keep a track of blood pressures at home and tells me that her blood pressures are better.  She reported in the mail for me for review.  Lifestyle modification urged. Mixed dyslipidemia and obesity: Weight reduction stressed diet emphasized.  Importance of aerobic exercise stressed and she promises to do better. Patient will be seen in follow-up appointment in 6 months or earlier if the patient has any concerns    Medication Adjustments/Labs and Tests Ordered: Current medicines are reviewed at length with the patient today.  Concerns regarding medicines are outlined above.  No orders of the defined types were placed in this encounter.  No orders of the defined types were placed in this encounter.    No chief complaint on file.    History of Present Illness:    Shannon NortonGail D Martin is a 81 y.o. female.  Patient has past medical history of essential hypertension, dyslipidemia, paroxysmal atrial fibrillation.  She denies any problems at this time and takes  care of activities of daily living.  She exercises to good extent.  No chest pain orthopnea or PND.   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   Laryngopharyngeal reflux (LPR) 03/24/2015   Left knee pain 10/01/2014   Memory changes 09/16/2021   Migraine without aura and without status migrainosus, not intractable 07/28/2019   Migraines 1943 to present   "fairly frequently"; Takes Propranolol (01/12/2015)   Mixed hyperlipidemia 12/11/2018   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   Overactive bladder    Takes Vesicare   PAF (paroxysmal atrial fibrillation) (HCC) 05/04/2021   Palpitations 04/08/2021   Primary osteoarthritis of left knee 12/03/2014   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   Seborrheic dermatitis 12/21/2020   Syncope and collapse 12/11/2018    Past Surgical History:  Procedure Laterality Date   BREAST BIOPSY Left ~ 1973   CATARACT EXTRACTION W/ INTRAOCULAR LENS  IMPLANT, BILATERAL Bilateral 2003   CHEST TUBE INSERTION  January 2015   [facility]   DILATION AND CURETTAGE OF UTERUS  4-5   JOINT REPLACEMENT     REPLACEMENT TOTAL KNEE Left    TONSILLECTOMY AND ADENOIDECTOMY  1946; 1947   TOTAL KNEE ARTHROPLASTY Left 01/11/2015   TOTAL KNEE ARTHROPLASTY Left 01/11/2015   Procedure: Left TOTAL KNEE  ARTHROPLASTY;  Surgeon: Dannielle Huh, MD;  Location: [facility] OR;  Service: Orthopedics;  Laterality: Left;   VIDEO ASSISTED THORACOSCOPY (VATS)/THOROCOTOMY Left 07/13/2013   VATS with insertion of chest tubes    Current Medications: Current Meds  Medication Sig   albuterol (PROVENTIL HFA;VENTOLIN HFA) 108 (90 Base) MCG/ACT inhaler  Inhale two puffs every four to six hours as needed for cough or wheeze.   apixaban (ELIQUIS) 5 MG TABS tablet Take 1 tablet (5 mg total) by mouth 2 (two) times daily.   aspirin EC 81 MG tablet Take 81 mg by mouth daily.   atorvastatin (LIPITOR) 40 MG tablet Take 1 tablet (40 mg total) by mouth daily.   budesonide-formoterol (SYMBICORT) 80-4.5 MCG/ACT inhaler INHALE 2 PUFFS INTO LUNGS TWICE DAILY, IN THE MORNING AND EVENING   Calcium Carb-Cholecalciferol (CALCIUM + D3) 600-200 MG-UNIT TABS Take 1 tablet by mouth 2 (two) times daily.   celecoxib (CELEBREX) 100 MG capsule TAKE 1 CAPSULE(100 MG) BY MOUTH TWICE DAILY   Cholecalciferol (D3-1000) 25 MCG (1000 UT) capsule Take 1,000 Units by mouth daily.   estradiol (ESTRACE) 0.1 MG/GM vaginal cream Place 1 Applicatorful vaginally at bedtime.   famotidine (PEPCID) 40 MG tablet TAKE 1 TABLET(40 MG) BY MOUTH TWICE DAILY   fluticasone (FLONASE) 50 MCG/ACT nasal spray Can use one spray in each nostril once daily as directed.   GEMTESA 75 MG TABS Take 1 tablet by mouth daily.   Hypromellose (ARTIFICIAL TEARS OP) Apply 1 drop to eye as needed for dry eyes.   loratadine (CLARITIN) 10 MG tablet Take 10 mg by mouth daily.   Multiple Vitamins-Minerals (MULTIVITAMIN PO) Take 1 tablet by mouth daily.   tamsulosin (FLOMAX) 0.4 MG CAPS capsule Take 0.4 mg by mouth daily.   VITAMIN E PO Take 400 Units by mouth 2 (two) times daily.     Allergies:   Tetanus toxoids, Bee venom, Hornet venom, Dilaudid [hydromorphone], Erythromycin, Fiorinal [butalbital-aspirin-caffeine], Amoxicillin, Doxycycline, Latex, Neomycin, and Penicillins   Social History   Socioeconomic History   Marital status: Widowed    Spouse name: Not on file   Number of children: 1   Years of education: Not on file   Highest education level: Not on file  Occupational History   Not on file  Tobacco Use   Smoking status: Never   Smokeless tobacco: Never  Vaping Use   Vaping Use: Never used   Substance and Sexual Activity   Alcohol use: Yes    Alcohol/week: 2.0 standard drinks    Types: 2 Glasses of wine per week   Drug use: No   Sexual activity: Not Currently  Other Topics Concern   Not on file  Social History Narrative   Daughter lives nearby, granddaughter lives in [place] - married 3 times, all deceased.  Has not been in contact with her sister in >20 years   Social Determinants of Health   Financial Resource Strain: Low Risk    Difficulty of Paying Living Expenses: Not hard at all  Food Insecurity: No Food Insecurity   Worried About Programme researcher, broadcasting/film/video in the Last Year: Never true   Barista in the Last Year: Never true  Transportation Needs: No Transportation Needs   Lack of Transportation (Medical): No   Lack of Transportation (Non-Medical): No  Physical Activity: Insufficiently Active   Days of Exercise per Week: 2 days   Minutes of Exercise per Session: 10 min  Stress: No Stress Concern Present   Feeling of  Stress : Not at all  Social Connections: Unknown   Frequency of Communication with Friends and Family: More than three times a week   Frequency of Social Gatherings with Friends and Family: More than three times a week   Attends Religious Services: Not on file   Active Member of Clubs or Organizations: No   Attends Banker Meetings: Never   Marital Status: Widowed     Family History: The patient's family history includes Diabetes in her father; Hypertension in her maternal grandfather, maternal grandmother, and mother.  ROS:   Please see the history of present illness.    All other systems reviewed and are negative.  EKGs/Labs/Other Studies Reviewed:    The following studies were reviewed today: I discussed my findings with the patient at length.   Recent Labs: 04/08/2021: TSH 1.290 09/16/2021: ALT 10; BUN 21; Creatinine, Ser 1.01; Hemoglobin 11.3; Platelets 247; Potassium 4.1; Sodium 138  Recent Lipid Panel     Component Value Date/Time   CHOL 185 09/16/2021 0931   TRIG 60 09/16/2021 0931   HDL 82 09/16/2021 0931   CHOLHDL 2.3 09/16/2021 0931   LDLCALC 92 09/16/2021 0931    Physical Exam:    VS:  BP (!) 150/60   Pulse 78   Ht 4' 11.6" (1.514 m)   Wt 152 lb 10.1 oz (69.2 kg)   SpO2 97%   BMI 30.21 kg/m     Wt Readings from Last 3 Encounters:  11/14/21 152 lb 10.1 oz (69.2 kg)  11/11/21 155 lb (70.3 kg)  10/19/21 155 lb (70.3 kg)     GEN: Patient is in no acute distress HEENT: Normal NECK: No JVD; No carotid bruits LYMPHATICS: No lymphadenopathy CARDIAC: Hear sounds regular, 2/6 systolic murmur at the apex. RESPIRATORY:  Clear to auscultation without rales, wheezing or rhonchi  ABDOMEN: Soft, non-tender, non-distended MUSCULOSKELETAL:  No edema; No deformity  SKIN: Warm and dry NEUROLOGIC:  Alert and oriented x 3 PSYCHIATRIC:  Normal affect   Signed, Garwin Brothers, MD  11/14/2021 10:10 AM    [facility]

## 2021-11-14 NOTE — Patient Instructions (Signed)
Medication Instructions:  Your physician recommends that you continue on your current medications as directed. Please refer to the Current Medication list given to you today.  *If you need a refill on your cardiac medications before your next appointment, please call your pharmacy*   Lab Work: None If you have labs (blood work) drawn today and your tests are completely normal, you will receive your results only by: MyChart Message (if you have MyChart) OR A paper copy in the mail If you have any lab test that is abnormal or we need to change your treatment, we will call you to review the results.   Testing/Procedures: None   Follow-Up: At [facility], you and your health needs are our priority.  As part of our continuing mission to provide you with exceptional heart care, we have created designated Provider Care Teams.  These Care Teams include your primary Cardiologist (physician) and Advanced Practice Providers (APPs -  Physician Assistants and Nurse Practitioners) who all work together to provide you with the care you need, when you need it.  We recommend signing up for the patient portal called "MyChart".  Sign up information is provided on this After Visit Summary.  MyChart is used to connect with patients for Virtual Visits (Telemedicine).  Patients are able to view lab/test results, encounter notes, upcoming appointments, etc.  Non-urgent messages can be sent to your provider as well.   To learn more about what you can do with MyChart, go to [email or web address].    Your next appointment:   9 month(s)  The format for your next appointment:   In Person  Provider:   Rajan Revankar, MD.   Other Instructions None  Important Information About Sugar       

## 2021-11-23 DIAGNOSIS — H5231 Anisometropia: Secondary | ICD-10-CM | POA: Diagnosis not present

## 2021-11-23 DIAGNOSIS — Z961 Presence of intraocular lens: Secondary | ICD-10-CM | POA: Diagnosis not present

## 2021-12-05 DIAGNOSIS — R159 Full incontinence of feces: Secondary | ICD-10-CM | POA: Diagnosis not present

## 2021-12-05 DIAGNOSIS — K219 Gastro-esophageal reflux disease without esophagitis: Secondary | ICD-10-CM | POA: Diagnosis not present

## 2021-12-05 DIAGNOSIS — R198 Other specified symptoms and signs involving the digestive system and abdomen: Secondary | ICD-10-CM | POA: Diagnosis not present

## 2021-12-05 DIAGNOSIS — K5790 Diverticulosis of intestine, part unspecified, without perforation or abscess without bleeding: Secondary | ICD-10-CM | POA: Diagnosis not present

## 2021-12-13 ENCOUNTER — Other Ambulatory Visit: Payer: Self-pay

## 2021-12-13 DIAGNOSIS — J3089 Other allergic rhinitis: Secondary | ICD-10-CM

## 2021-12-13 MED ORDER — FLUTICASONE PROPIONATE 50 MCG/ACT NA SUSP: NASAL | 6 refills | Status: DC

## 2021-12-20 DIAGNOSIS — N952 Postmenopausal atrophic vaginitis: Secondary | ICD-10-CM | POA: Diagnosis not present

## 2021-12-20 DIAGNOSIS — N3281 Overactive bladder: Secondary | ICD-10-CM | POA: Diagnosis not present

## 2021-12-20 DIAGNOSIS — R1031 Right lower quadrant pain: Secondary | ICD-10-CM | POA: Diagnosis not present

## 2021-12-20 DIAGNOSIS — R1032 Left lower quadrant pain: Secondary | ICD-10-CM | POA: Diagnosis not present

## 2021-12-20 DIAGNOSIS — R339 Retention of urine, unspecified: Secondary | ICD-10-CM | POA: Diagnosis not present

## 2021-12-23 DIAGNOSIS — I700 Atherosclerosis of aorta: Secondary | ICD-10-CM | POA: Diagnosis not present

## 2021-12-23 DIAGNOSIS — R109 Unspecified abdominal pain: Secondary | ICD-10-CM | POA: Diagnosis not present

## 2021-12-23 DIAGNOSIS — R1031 Right lower quadrant pain: Secondary | ICD-10-CM | POA: Diagnosis not present

## 2021-12-23 DIAGNOSIS — R339 Retention of urine, unspecified: Secondary | ICD-10-CM | POA: Diagnosis not present

## 2021-12-30 ENCOUNTER — Other Ambulatory Visit: Payer: Self-pay

## 2021-12-30 MED ORDER — ALBUTEROL SULFATE HFA 108 (90 BASE) MCG/ACT IN AERS: INHALATION_SPRAY | RESPIRATORY_TRACT | 3 refills | Status: DC

## 2022-01-24 DIAGNOSIS — N3281 Overactive bladder: Secondary | ICD-10-CM | POA: Diagnosis not present

## 2022-01-24 DIAGNOSIS — N952 Postmenopausal atrophic vaginitis: Secondary | ICD-10-CM | POA: Diagnosis not present

## 2022-01-24 DIAGNOSIS — R339 Retention of urine, unspecified: Secondary | ICD-10-CM | POA: Diagnosis not present

## 2022-02-06 ENCOUNTER — Other Ambulatory Visit: Payer: Self-pay

## 2022-02-06 DIAGNOSIS — J3089 Other allergic rhinitis: Secondary | ICD-10-CM

## 2022-02-06 MED ORDER — FLUTICASONE PROPIONATE 50 MCG/ACT NA SUSP: NASAL | 1 refills | Status: DC

## 2022-02-15 ENCOUNTER — Other Ambulatory Visit: Payer: Medicare HMO

## 2022-02-15 ENCOUNTER — Encounter: Payer: Self-pay | Admitting: Legal Medicine

## 2022-02-15 ENCOUNTER — Ambulatory Visit (INDEPENDENT_AMBULATORY_CARE_PROVIDER_SITE_OTHER): Payer: Medicare HMO | Admitting: Legal Medicine

## 2022-02-15 VITALS — BP 108/64 | HR 97 | Temp 97.5°F | Wt 145.0 lb

## 2022-02-15 DIAGNOSIS — K6289 Other specified diseases of anus and rectum: Secondary | ICD-10-CM

## 2022-02-15 DIAGNOSIS — K580 Irritable bowel syndrome with diarrhea: Secondary | ICD-10-CM

## 2022-02-15 DIAGNOSIS — I480 Paroxysmal atrial fibrillation: Secondary | ICD-10-CM

## 2022-02-15 DIAGNOSIS — I10 Essential (primary) hypertension: Secondary | ICD-10-CM

## 2022-02-15 HISTORY — DX: Other specified diseases of anus and rectum: K62.89

## 2022-02-15 MED ORDER — VIBERZI 100 MG PO TABS: 100.0000 mg | ORAL_TABLET | Freq: Two times a day (BID) | ORAL | 3 refills | Status: DC

## 2022-02-15 NOTE — Progress Notes (Signed)
Established Patient Office Visit  Subjective   Patient ID: Shannon Martin, female    DOB: [birth year]  Age: 81 y.o. MRN: [id number]  Chief Complaint  Patient presents with   GI Problem    HPI: diarrhea in AM, some soiling, Dr. Braulio Conte felt she was constipated. Going on 3 months. Some blood. She denies constipation or pain.  I reviewed consult records.  New problem with patulent anus.  We discussed prevagen. No help.  I answered multiple questions.    Review of Systems  Constitutional: Negative.  Negative for chills and fever.  HENT: Negative.    Eyes:  Negative for redness.  Respiratory: Negative.    Cardiovascular:  Negative for palpitations and claudication.  Gastrointestinal:  Positive for diarrhea.  Genitourinary: Negative.   Musculoskeletal: Negative.   Skin: Negative.   Neurological: Negative.   Psychiatric/Behavioral: Negative.        Objective:     BP 108/64 (BP Location: Left Arm, Patient Position: Sitting, Cuff Size: Small)   Pulse 97   Temp (!) 97.5 F (36.4 C) (Temporal)   Wt 145 lb (65.8 kg)   SpO2 97%   BMI 28.70 kg/m    Physical Exam Constitutional:      Appearance: Normal appearance. She is obese.  HENT:     Right Ear: Tympanic membrane normal.     Left Ear: Tympanic membrane normal.     Nose: Nose normal.     Mouth/Throat:     Mouth: Mucous membranes are dry.     Pharynx: Oropharynx is clear.  Eyes:     Extraocular Movements: Extraocular movements intact.     Conjunctiva/sclera: Conjunctivae normal.     Pupils: Pupils are equal, round, and reactive to light.  Cardiovascular:     Rate and Rhythm: Normal rate and regular rhythm.     Pulses: Normal pulses.     Heart sounds: Normal heart sounds.  Pulmonary:     Effort: Pulmonary effort is normal.     Breath sounds: Normal breath sounds.  Abdominal:     General: Abdomen is flat. Bowel sounds are normal. There is no distension.     Palpations: Abdomen is soft.     Comments: Weak  anal sphincter, no stool  Genitourinary:    Comments: Patulent rectum, no stool felt. Musculoskeletal:        General: Normal range of motion.     Cervical back: Normal range of motion and neck supple.     Right lower leg: No edema.     Left lower leg: No edema.  Skin:    General: Skin is warm.     Capillary Refill: Capillary refill takes less than 2 seconds.  Neurological:     General: No focal deficit present.     Mental Status: She is alert and oriented to person, place, and time. Mental status is at baseline.     Gait: Gait normal.     Deep Tendon Reflexes: Reflexes normal.  Psychiatric:        Mood and Affect: Mood normal.            Assessment & Plan:   Problem List Items Addressed This Visit       Cardiovascular and Mediastinum   Essential hypertension   Relevant Orders   AMB Referral to [facility] Coordinaton An individual hypertension care plan was established and reinforced today.  The patient's status was assessed using clinical findings on exam and labs or diagnostic tests. The patient's  success at meeting treatment goals on disease specific evidence-based guidelines and found to be well controlled. SELF MANAGEMENT: The patient and I together assessed ways to personally work towards obtaining the recommended goals. RECOMMENDATIONS: avoid decongestants found in common cold remedies, decrease consumption of alcohol, perform routine monitoring of BP with home BP cuff, exercise, reduction of dietary salt, take medicines as prescribed, try not to miss doses and quit smoking.  Regular exercise and maintaining a healthy weight is needed.  Stress reduction may help. A CLINICAL SUMMARY including written plan identify barriers to care unique to individual due to social or financial issues.  We attempt to mutually creat solutions for individual and family understanding.    PAF (paroxysmal atrial fibrillation) (HCC)   Relevant Orders   AMB Referral to [facility]  Coordinaton Under good control     Digestive   Proctitis Patient has mucous and loose stool with some soiling.  This may be from some proctitis but no pain.  Suggest KEGAL exercises, may need surgeon for low sphincter tone.  May be IBS.  No recent colonoscopy but no IBD found in past.   Other Visit Diagnoses     Irritable bowel syndrome with diarrhea    -  Primary   Relevant Medications   Eluxadoline (VIBERZI) 100 MG TABS   Other Relevant Orders   AMB Referral to [facility]  Try viberzi if we can get affordable.  Gave coupon, Alternatives inclute proctofoam or other medicines for mucinous proctitis.       Return in about 1 month (around 03/18/2022).    Brent Bulla, MD

## 2022-02-18 ENCOUNTER — Encounter: Payer: Self-pay | Admitting: Legal Medicine

## 2022-02-20 ENCOUNTER — Telehealth: Payer: Self-pay | Admitting: Legal Medicine

## 2022-02-20 NOTE — Progress Notes (Signed)
  Chronic Care Management   Note  02/20/2022 Name: ALYA SMALTZ MRN: [id number] DOB: 1940/10/05  [name] is a 81 y.o. year old female who is a primary care patient of Abigail Miyamoto, MD. I reached out to [name] by phone today in response to a referral sent by Ms. Marlowe Aschoff Klingbeil's PCP, Abigail Miyamoto, MD.   Ms. Hoopes was given information about Chronic Care Management services today including:  CCM service includes personalized support from designated clinical staff supervised by her physician, including individualized plan of care and coordination with other care providers 24/7 contact phone numbers for assistance for urgent and routine care needs. Service will only be billed when office clinical staff spend 20 minutes or more in a month to coordinate care. Only one practitioner may furnish and bill the service in a calendar month. The patient may stop CCM services at any time (effective at the end of the month) by phone call to the office staff.   Patient agreed to services and verbal consent obtained.   Follow up plan:REFERRAL/ NO COPAY   Tatjana Dellinger Upstream Scheduler

## 2022-02-28 ENCOUNTER — Telehealth: Payer: Self-pay

## 2022-02-28 ENCOUNTER — Other Ambulatory Visit: Payer: Self-pay

## 2022-02-28 MED ORDER — DICYCLOMINE HCL 20 MG PO TABS: 20.0000 mg | ORAL_TABLET | Freq: Three times a day (TID) | ORAL | 1 refills | Status: DC

## 2022-02-28 NOTE — Telephone Encounter (Signed)
Insurance denied Viberzi. They recommended to try the preferred drug,  Xifaxan tablet. Patient was informed. Dr Marina Goodell sent Bentyl 20 mg taking twice a day before meals instead.

## 2022-03-07 ENCOUNTER — Telehealth: Payer: Self-pay

## 2022-03-07 NOTE — Progress Notes (Signed)
Chronic Care Management Pharmacy Assistant   Name: Shannon Martin  MRN: [id number] DOB: 11-19-1940   Reason for Encounter: Chart Prep for initial visit with CPP   Conditions to be addressed/monitored: Atrial Fibrillation, HTN, HLD, GERD, and Asthma, Migraines, Chronic Diarrhea, Osteoarthritis, Seborrheic Dermatitis, Overactive bladder.  Recent office visits:  02/28/22 Shannon Martin CMA. Telephone encounter. Dr Marina Goodell sent Bentyl 20 mg taking twice a day before meals instead of Veberzi.   02/15/22 Shannon Bulla MD. Seen for IBS. Referral to community care coordination. Started on Eluxadoline 100mg .  11/11/21 11/13/21 NP. Seen for Edema of hand. No med changes.   10/19/21 10/21/21 MD. Seen for asthma. Referral to Gastroenterology. No med changes.   10/11/21 10/13/21 LPN. Seen for Medicare Annual Wellness. No med changes.  10/04/21 12/04/21 MD. Seen for cough. Started on Azithromycin and Administered Kenalog 60mg .   09/16/21 MD. Seen for routine visit. Started on Tramadol HCI 50mg .  Recent consult visits:  11/14/21 (Cardiology) Shannon Bulla MD. Seen for HTN. No med changes.  Hospital visits:  None in previous 6 months  Medications: Outpatient Encounter Medications as of 03/07/2022  Medication Sig   albuterol (VENTOLIN HFA) 108 (90 Base) MCG/ACT inhaler Inhale two puffs every four to six hours as needed for cough or wheeze.   apixaban (ELIQUIS) 5 MG TABS tablet Take 1 tablet (5 mg total) by mouth 2 (two) times daily.   aspirin EC 81 MG tablet Take 81 mg by mouth daily.   atorvastatin (LIPITOR) 40 MG tablet Take 1 tablet (40 mg total) by mouth daily.   budesonide-formoterol (SYMBICORT) 80-4.5 MCG/ACT inhaler INHALE 2 PUFFS INTO LUNGS TWICE DAILY, IN THE MORNING AND EVENING   Calcium Carb-Cholecalciferol (CALCIUM + D3) 600-200 MG-UNIT TABS Take 1 tablet by mouth 2 (two) times daily.   celecoxib (CELEBREX) 100 MG capsule TAKE 1  CAPSULE(100 MG) BY MOUTH TWICE DAILY   Cholecalciferol (D3-1000) 25 MCG (1000 UT) capsule Take 1,000 Units by mouth daily.   dicyclomine (BENTYL) 20 MG tablet Take 1 tablet (20 mg total) by mouth 4 (four) times daily -  before meals and at bedtime.   Eluxadoline (VIBERZI) 100 MG TABS Take 1 tablet (100 mg total) by mouth 2 (two) times daily with a meal.   estradiol (ESTRACE) 0.1 MG/GM vaginal cream Place 1 Applicatorful vaginally at bedtime.   famotidine (PEPCID) 40 MG tablet TAKE 1 TABLET(40 MG) BY MOUTH TWICE DAILY   fluticasone (FLONASE) 50 MCG/ACT nasal spray Can use one spray in each nostril once daily as directed.   GEMTESA 75 MG TABS Take 1 tablet by mouth daily.   Hypromellose (ARTIFICIAL TEARS OP) Apply 1 drop to eye as needed for dry eyes.   loratadine (CLARITIN) 10 MG tablet Take 10 mg by mouth daily.   Multiple Vitamins-Minerals (MULTIVITAMIN PO) Take 1 tablet by mouth daily.   tamsulosin (FLOMAX) 0.4 MG CAPS capsule Take 0.4 mg by mouth daily.   VITAMIN E PO Take 400 Units by mouth 2 (two) times daily.   No facility-administered encounter medications on file as of 03/07/2022.    No results found for: "HGBA1C", "MICROALBUR"   BP Readings from Last 3 Encounters:  02/15/22 108/64  11/14/21 (!) 150/60  11/11/21 (!) 144/60      Have you seen any other providers since your last visit with PCP? No  What is your top health concern to discuss at your upcoming visit? She would like to speak about her  overactive bladder although she has a provider that manages this well. She may be interested in on-boarding to our pharmacy since she is having issues with other pharmacies sending her too much medicine at once where she has an oversupply of everything and it overwhelms her.   Do you have any problems getting your medications from the pharmacy? No  Financial barriers? No Access barriers? No   Shannon Martin was reminded to have all medications, supplements and any blood glucose  and/or blood pressure readings available for review with Artelia Laroche, Pharm. D, at her telephone visit on 03/14/22 at 11:00am .    Star Rating Drugs:  Medication:  Last Fill: Day Supply Atorvastatin   01/16/22-10/17/21 90ds  Care Gaps: Last Annual Wellness visit?10/11/21 Last Colonoscopy?None noted  Last Mammogram? None noted  Last Bone Denisty? 10/19/20  Immunizations:  Covid: 10/18/20 Influenza: 04/20/21 Zoster: 12/30/21 Pneumonia: 09/16/21 HPV: Aged Out   Roxana Hires, [place] Clinical Pharmacist Assistant  [phone]

## 2022-03-14 ENCOUNTER — Other Ambulatory Visit: Payer: Self-pay | Admitting: Legal Medicine

## 2022-03-14 ENCOUNTER — Ambulatory Visit (INDEPENDENT_AMBULATORY_CARE_PROVIDER_SITE_OTHER): Payer: Medicare HMO

## 2022-03-14 DIAGNOSIS — J4540 Moderate persistent asthma, uncomplicated: Secondary | ICD-10-CM

## 2022-03-14 NOTE — Patient Instructions (Signed)
Visit Information   Goals Addressed   None    Patient Care Plan: CCM Pharmacy Care Plan     Problem Identified: Disease State Management   Priority: High  Onset Date: 03/14/2022     Long-Range Goal: Pain, IBS, Cholesterol   Start Date: 03/14/2022  Expected End Date: 03/14/2023  This Visit's Progress: On track  Priority: High  Note:   Current Barriers:  Does not contact provider office for questions/concerns  Pharmacist Clinical Goal(s):  Patient will contact provider office for questions/concerns as evidenced notation of same in electronic health record through collaboration with PharmD and provider.   Interventions: 1:1 collaboration with Abigail Miyamoto, MD regarding development and update of comprehensive plan of care as evidenced by provider attestation and co-signature Inter-disciplinary care team collaboration (see longitudinal plan of care) Comprehensive medication review performed; medication list updated in electronic medical record  Hyperlipidemia: (LDL goal < 100) The ASCVD Risk score (Arnett DK, et al., 2019) failed to calculate for the following reasons:   The 2019 ASCVD risk score is only valid for ages 66 to 24 Lab Results  Component Value Date   CHOL 185 09/16/2021   CHOL 193 03/18/2021   CHOL 172 09/16/2020   Lab Results  Component Value Date   HDL 82 09/16/2021   HDL 72 03/18/2021   HDL 84 09/16/2020   Lab Results  Component Value Date   LDLCALC 92 09/16/2021   LDLCALC 99 03/18/2021   LDLCALC 73 09/16/2020   Lab Results  Component Value Date   TRIG 60 09/16/2021   TRIG 127 03/18/2021   TRIG 83 09/16/2020   Lab Results  Component Value Date   CHOLHDL 2.3 09/16/2021   CHOLHDL 2.7 03/18/2021   CHOLHDL 2.0 09/16/2020  No results found for: "LDLDIRECT" Last vitamin D Lab Results  Component Value Date   VD25OH 59.1 09/16/2020   Lab Results  Component Value Date   TSH 1.290 04/08/2021  -Controlled -Current  treatment: Atorvastatin 40mg  Appropriate, Effective, Safe, Accessible -Medications previously tried: N/A  -Current dietary patterns: "Tries to eat healthy," States eats a lot of fruits -Current exercise habits: None -Educated on Cholesterol goals;  -Recommended to continue current medication  Atrial Fibrillation (Goal: prevent stroke and major bleeding) -Managed by Dr. -Controlled -CHADSVASC: 4 -Current treatment: Rate control:  None Anticoagulation:  Eliquis 5mg  BID Appropriate, Effective, Safe, Accessible 81 y.o. Lab Results  Component Value Date   CREATININE 1.01 (H) 09/16/2021     Wt Readings from Last 3 Encounters:  02/15/22 145 lb (65.8 kg)  11/14/21 152 lb 10.1 oz (69.2 kg)  11/11/21 155 lb (70.3 kg)   -Medications previously tried: N/A -Home BP and HR readings: Didn't have list  -Counseled on importance of adherence to anticoagulant exactly as prescribed; Sept 2023: Will ask Cardio how long patient is to be on both ASA and Eliquis  IBS-C/D (Goal: Prevent flares) -Not ideally controlled -Current treatment  Dicyclomine 20mg  QID (Patient taking before meals BID/TID) Appropriate, Effective, Safe, Accessible -Medications previously tried: Eluxadoline (insurance didn't cover, she didn't even complete trial)  Sept 2023: Patient very satisfied with therapy. Has only been on 2 weeks but has only had one bad day. Will be evaluated by PCP again and will defer to them  OAB (Goal: Prevent accidents) -Managed by [birth year]2002 [phone] [facility]) -Controlled -Current treatment  Tamsulosin 0.4mg  BID Appropriate, Effective, Safe, Accessible Gemtesa 75mg  QD Appropriate, Effective, Safe, Accessible Estradiol 0.01% HS Appropriate, Effective, Safe, Accessible -Medications previously  tried: N/A  Sept 2023: Patient wasn't interested in speaking about this at length with me today. She is very satisfied with Sherlyn Lees' management   Patient Goals/Self-Care  Activities Patient will:  - take medications as prescribed as evidenced by patient report and record review  Follow Up Plan: The patient has been provided with contact information for the care management team and has been advised to call with any health related questions or concerns.   CPP F/U PRN  [place] Constable, Pharm.D. - [phone]       Ms. Cacioppo was given information about Chronic Care Management services today including:  CCM service includes personalized support from designated clinical staff supervised by her physician, including individualized plan of care and coordination with other care providers 24/7 contact phone numbers for assistance for urgent and routine care needs. Standard insurance, coinsurance, copays and deductibles apply for chronic care management only during months in which we provide at least 20 minutes of these services. Most insurances cover these services at 100%, however patients may be responsible for any copay, coinsurance and/or deductible if applicable. This service may help you avoid the need for more expensive face-to-face services. Only one practitioner may furnish and bill the service in a calendar month. The patient may stop CCM services at any time (effective at the end of the month) by phone call to the office staff.  Patient agreed to services and verbal consent obtained.   The patient verbalized understanding of instructions, educational materials, and care plan provided today and DECLINED offer to receive copy of patient instructions, educational materials, and care plan.  The pharmacy team will reach out to the patient again over the next 60 days.   Lane Hacker, [place]

## 2022-03-14 NOTE — Progress Notes (Signed)
Chronic Care Management Pharmacy Note  03/14/2022 Name:  Shannon Martin MRN:  [id number] DOB:  [birth year]  Summary: -Very pleasant 81 year old woman presents for initial CCM visit. Her chief concern is getting her medications set up for the same day of the month. 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 Born in [place], grew up in [place], [place]. Very interested in her medical care  Recommendations/Changes made from today's visit: -Onboard to [facility], asked providers to send 90 days scripts   Subjective: Shannon Martin is an 81 y.o. year old female who is a primary patient of Henrene Pastor, Zeb Comfort, MD.  The CCM team was consulted for assistance with disease management and care coordination needs.    Engaged with patient by telephone for initial visit in response to provider referral for pharmacy case management and/or care coordination services.   Consent to Services:  The patient was given the following information about Chronic Care Management services today, agreed to services, and gave verbal consent: 1. CCM service includes personalized support from designated clinical staff supervised by the primary care provider, including individualized plan of care and coordination with other care providers 2. 24/7 contact phone numbers for assistance for urgent and routine care needs. 3. Service will only be billed when office clinical staff spend 20 minutes or more in a month to coordinate care. 4. Only one practitioner may furnish and bill the service in a calendar month. 5.The patient may stop CCM services at any time (effective at the end of the month) by phone call to the office staff. 6. The patient will be responsible for cost sharing (co-pay) of up to 20% of the service fee (after annual deductible is met). Patient agreed to services and consent obtained.  Patient Care Team: Lillard Anes, MD as PCP - General (Family Medicine) Sherlyn Lees, [place] as Nurse Practitioner (Family Medicine) Lane Hacker, [facility] as Pharmacist (Pharmacist)  Recent office visits:  02/28/22 Thompson Caul CMA. Telephone encounter. Dr Henrene Pastor sent Bentyl 20 mg taking twice a day before meals instead of Veberzi.    02/15/22 Reinaldo Meeker MD. Seen for IBS. Referral to community care coordination. Started on Eluxadoline 132m.   11/11/21 HJerrell BelfastNP. Seen for Edema of hand. No med changes.    10/19/21 PReinaldo MeekerMD. Seen for asthma. Referral to Gastroenterology. No med changes.    10/11/21 SRhae HammockLPN. Seen for Medicare Annual Wellness. No med changes.   10/04/21 PReinaldo MeekerMD. Seen for cough. Started on Azithromycin and Administered Kenalog 644m    09/16/21 PeReinaldo MeekerD. Seen for routine visit. Started on Tramadol HCI 5038m  Recent consult visits:  11/14/21 (Cardiology) RevJyl Heinz. Seen for HTN. No med changes.   Hospital visits:  None in previous 6 months   Objective:  Lab Results  Component Value Date   CREATININE 1.01 (H) 09/16/2021   BUN 21 09/16/2021   EGFR 57 (L) 09/16/2021   GFRNONAA 52 (L) 03/09/2020   GFRAA 59 (L) 03/09/2020   NA 138 09/16/2021   K 4.1 09/16/2021   CALCIUM 9.9 09/16/2021   CO2 23 09/16/2021   GLUCOSE 94 09/16/2021    No results found for: "HGBA1C", "FRUCTOSAMINE", "GFR", "MICROALBUR"  Last diabetic Eye exam: No results found for: "HMDIABEYEEXA"  Last diabetic Foot exam: No results found for: "HMDIABFOOTEX"   Lab  Results  Component Value Date   CHOL 185 09/16/2021   HDL 82 09/16/2021   LDLCALC 92 09/16/2021   TRIG 60 09/16/2021   CHOLHDL 2.3 09/16/2021       Latest Ref Rng & Units 09/16/2021    9:31 AM 07/29/2021   11:04 AM 05/04/2021    2:44 PM  Hepatic Function  Total Protein 6.0 - 8.5 g/dL 6.3  5.9  6.3   Albumin 3.7 - 4.7 g/dL 4.5  4.2  4.4   AST 0 - 40 IU/L _0 ALT 0 - 32 IU/L _1 Alk Phosphatase 44 - 121 IU/L 80  72  64   Total Bilirubin 0.0 - 1.2 mg/dL 0.3  0.2  <0.2   Bilirubin, Direct 0.00 - 0.40 mg/dL  0.10  <0.10     Lab Results  Component Value Date/Time   TSH 1.290 04/08/2021 01:42 PM       Latest Ref Rng & Units 09/16/2021    9:31 AM 05/02/2021    9:09 AM 03/18/2021    9:22 AM  CBC  WBC 3.4 - 10.8 x10E3/uL 6.3  6.7  6.8   Hemoglobin 11.1 - 15.9 g/dL 11.3  11.1  11.9   Hematocrit 34.0 - 46.6 % 33.6  32.9  36.5   Platelets 150 - 450 x10E3/uL 247  281  348     Lab Results  Component Value Date/Time   VD25OH 59.1 09/16/2020 10:03 AM    Clinical ASCVD: No  The ASCVD Risk score (Arnett DK, et al., 2019) failed to calculate for the following reasons:   The 2019 ASCVD risk score is only valid for ages 67 to 37       10/11/2021    9:28 AM 10/04/2021   10:39 AM 09/16/2021    8:44 AM  Depression screen PHQ 2/9  Decreased Interest 0 0 0  Down, Depressed, Hopeless 0 0 0  PHQ - 2 Score 0 0 0  Altered sleeping 0  0  Tired, decreased energy 0  0  Change in appetite 0  0  Feeling bad or failure about yourself  0  0  Trouble concentrating 0  0  Moving slowly or fidgety/restless 0  0  Suicidal thoughts 0  0  PHQ-9 Score 0  0  Difficult doing work/chores Not difficult at all  Not difficult at all     Other: (CHADS2VASc if Afib, MMRC or CAT for COPD, ACT, DEXA)  Social History   Tobacco Use  Smoking Status Never  Smokeless Tobacco Never   BP Readings from Last 3 Encounters:  02/15/22 108/64  11/14/21 (!) 150/60  11/11/21 (!) 144/60   Pulse Readings from Last 3 Encounters:  02/15/22 97  11/14/21 78  11/11/21 60   Wt Readings from Last 3 Encounters:  02/15/22 145 lb (65.8 kg)  11/14/21 152 lb 10.1 oz (69.2 kg)  11/11/21 155 lb (70.3 kg)   BMI Readings from Last 3 Encounters:  02/15/22 28.70 kg/m  11/14/21 30.21 kg/m  11/11/21 31.31 kg/m    Assessment/Interventions: Review of patient past medical history, allergies,  medications, health status, including review of consultants reports, laboratory and other test data, was performed as part of comprehensive evaluation and provision of chronic care management services.   SDOH:  (Social Determinants of Health) assessments and interventions performed: Yes SDOH Interventions    Flowsheet Row Chronic Care Management from 03/14/2022 in [place]  Support from 10/11/2021 in [place] from 10/05/2020 in [place] Interventions     Food Insecurity Interventions -- Intervention Not Indicated Intervention Not Indicated  Housing Interventions -- Intervention Not Indicated Intervention Not Indicated  Transportation Interventions Intervention Not Indicated Intervention Not Indicated Intervention Not Indicated  Financial Strain Interventions Intervention Not Indicated Intervention Not Indicated --  Physical Activity Interventions -- Local [employer] --  Stress Interventions -- Intervention Not Indicated --  Social Connections Interventions -- Intervention Not Indicated --      SDOH Screenings   Food Insecurity: No Food Insecurity (10/11/2021)  Housing: Low Risk  (10/11/2021)  Transportation Needs: No Transportation Needs (03/14/2022)  Alcohol Screen: Low Risk  (10/11/2021)  Depression (PHQ2-9): Low Risk  (10/11/2021)  Financial Resource Strain: Low Risk  (03/14/2022)  Physical Activity: Insufficiently Active (10/11/2021)  Social Connections: Unknown (10/11/2021)  Stress: No Stress Concern Present (10/11/2021)  Tobacco Use: Low Risk  (02/18/2022)    CCM Care Plan  Allergies  Allergen Reactions   Tetanus Toxoids Swelling and Other (See Comments)    Swelling around throat and jaws   Bee Venom Swelling and Other (See Comments)    Crusty area on skin   Hornet Venom Swelling    Crusty area on skin   Dilaudid [Hydromorphone] Nausea And Vomiting    Causes nausea and vomiting   Erythromycin Nausea And Vomiting   Fiorinal  [Butalbital-Aspirin-Caffeine]     Altered mental status   Amoxicillin Other (See Comments)    dizzy   Doxycycline Other (See Comments)    dizzy   Latex Other (See Comments)    Skin will crack open and bleed   Neomycin Other (See Comments)    Irritates skin   Penicillins Itching, Swelling and Rash    Medications Reviewed Today     Reviewed by Lane Hacker, [facility] (Pharmacist) on 03/14/22 at [place] List Status: <None>   Medication Order Taking? Sig Documenting Provider Last Dose Status Informant  albuterol (VENTOLIN HFA) 108 (90 Base) MCG/ACT inhaler [id number] Yes Inhale two puffs every four to six hours as needed for cough or wheeze. Cox, Kirsten, MD Taking Active   apixaban (ELIQUIS) 5 MG TABS tablet [id number] Yes Take 1 tablet (5 mg total) by mouth 2 (two) times daily. Revankar, Reita Cliche, MD Taking Active   aspirin EC 81 MG tablet [id number] Yes Take 81 mg by mouth daily. [provider] Taking Active   atorvastatin (LIPITOR) 40 MG tablet [id number] Yes Take 1 tablet (40 mg total) by mouth daily. Lillard Anes, MD Taking Active   budesonide-formoterol [facility]) 80-4.5 MCG/ACT inhaler [id number] Yes INHALE 2 PUFFS INTO LUNGS TWICE DAILY, IN THE MORNING AND EVENING Lillard Anes, MD Taking Active   Calcium Carb-Cholecalciferol (CALCIUM + D3) 600-200 MG-UNIT TABS [id number] Yes Take 1 tablet by mouth 2 (two) times daily. [provider] Taking Active Self  celecoxib (CELEBREX) 100 MG capsule [id number] Yes TAKE 1 CAPSULE(100 MG) BY MOUTH TWICE DAILY Lillard Anes, MD Taking Active   Cholecalciferol (D3-1000) 25 MCG (1000 UT) capsule [id number] Yes Take 1,000 Units by mouth daily. [provider] Taking Active   dicyclomine (BENTYL) 20 MG tablet [id number] Yes Take 1 tablet (20 mg total) by mouth 4 (four) times daily -  before meals and at bedtime. Lillard Anes, MD Taking Active   Eluxadoline [facility]) 100 MG TABS [id number] No  Take 1 tablet (100 mg total) by mouth 2 (two) times daily  with a meal.  Patient not taking: Reported on 03/14/2022   Lillard Anes, MD Not Taking Consider Medication Status and Discontinue   estradiol (ESTRACE) 0.1 MG/GM vaginal cream [id number] Yes Place 1 Applicatorful vaginally at bedtime. [provider] Taking Active   famotidine (PEPCID) 40 MG tablet [id number] Yes TAKE 1 TABLET(40 MG) BY MOUTH TWICE DAILY Lillard Anes, MD Taking Active   fluticasone [facility]) 50 MCG/ACT nasal spray [id number] Yes Can use one spray in each nostril once daily as directed. Lillard Anes, MD Taking Active   GEMTESA 75 MG TABS [id number] Yes Take 1 tablet by mouth daily. Lillard Anes, MD Taking Active   Hypromellose (ARTIFICIAL TEARS OP) [id number]  Apply 1 drop to eye as needed for dry eyes. [provider]  Active   loratadine (CLARITIN) 10 MG tablet [id number]  Take 10 mg by mouth daily. [provider]  Active Self  Multiple Vitamins-Minerals (MULTIVITAMIN PO) [id number]  Take 1 tablet by mouth daily. [provider]  Active Self  tamsulosin (FLOMAX) 0.4 MG CAPS capsule [id number]  Take 0.4 mg by mouth daily. [provider]  Active   VITAMIN E PO [id number]  Take 400 Units by mouth 2 (two) times daily. 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   Syncope and collapse 12/11/2018   Mixed  hyperlipidemia 12/11/2018   Essential hypertension 12/11/2018   Iliotibial band syndrome of left side 06/17/2015   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   Laryngopharyngeal reflux (LPR) 03/24/2015   GERD (gastroesophageal reflux disease) 03/08/2015   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   Allergic rhinitis 02/16/2015   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   Primary osteoarthritis of left knee 12/03/2014   Left knee pain 10/01/2014    Immunization History  Administered Date(s) Administered   Fluad Quad(high Dose 65+) 05/07/2020, 04/20/2021   Influenza,inj,Quad PF,6+ Mos 03/28/2019   PFIZER(Purple Top)SARS-COV-2 Vaccination 08/29/2019, 09/26/2019, 03/31/2020, 10/18/2020   PNEUMOCOCCAL CONJUGATE-20 09/16/2021   Pneumococcal Conjugate-13 03/15/2014   Pneumococcal-Unspecified 09/09/2013   Zoster Recombinat (Shingrix) 12/30/2021    Conditions to be addressed/monitored:  Hypertension, Hyperlipidemia, and Atrial Fibrillation  Care Plan : [place]  Updates made by Lane Hacker, [place] since 03/14/2022 12:00 AM     Problem: Disease State Management   Priority: High  Onset Date: 03/14/2022     Long-Range Goal: Pain, IBS, Cholesterol   Start Date: 03/14/2022  Expected End Date: 03/14/2023  This Visit's Progress: On track  Priority: High  Note:   Current Barriers:  Does not contact provider office for questions/concerns  Pharmacist Clinical Goal(s):  Patient will contact provider office for questions/concerns as evidenced notation of same in electronic health record through collaboration with PharmD and provider.   Interventions: 1:1 collaboration with Lillard Anes, MD regarding development and update of comprehensive plan of care as evidenced by provider attestation and co-signature Inter-disciplinary care team collaboration (see longitudinal plan of care) Comprehensive medication review performed; medication list updated  in electronic medical  record  Hyperlipidemia: (LDL goal < 100) The ASCVD Risk score (Arnett DK, et al., 2019) failed to calculate for the following reasons:   The 2019 ASCVD risk score is only valid for ages 36 to 64 Lab Results  Component Value Date   CHOL 185 09/16/2021   CHOL 193 03/18/2021   CHOL 172 09/16/2020   Lab Results  Component Value Date   HDL 82 09/16/2021   HDL 72 03/18/2021   HDL 84 09/16/2020   Lab Results  Component Value Date   LDLCALC 92 09/16/2021   LDLCALC 99 03/18/2021   LDLCALC 73 09/16/2020   Lab Results  Component Value Date   TRIG 60 09/16/2021   TRIG 127 03/18/2021   TRIG 83 09/16/2020   Lab Results  Component Value Date   CHOLHDL 2.3 09/16/2021   CHOLHDL 2.7 03/18/2021   CHOLHDL 2.0 09/16/2020  No results found for: "LDLDIRECT" Last vitamin D Lab Results  Component Value Date   VD25OH 59.1 09/16/2020   Lab Results  Component Value Date   TSH 1.290 04/08/2021  -Controlled -Current treatment: Atorvastatin 43m Appropriate, Effective, Safe, Accessible -Medications previously tried: N/A  -Current dietary patterns: "Tries to eat healthy," States eats a lot of fruits -Current exercise habits: None -Educated on Cholesterol goals;  -Recommended to continue current medication  Atrial Fibrillation (Goal: prevent stroke and major bleeding) -Managed by Dr. RGeraldo Pitter-Controlled -CHADSVASC: 4 -Current treatment: Rate control:  None Anticoagulation:  Eliquis 550mBID Appropriate, Effective, Safe, Accessible 8019.o. Lab Results  Component Value Date   CREATININE 1.01 (H) 09/16/2021     Wt Readings from Last 3 Encounters:  02/15/22 145 lb (65.8 kg)  11/14/21 152 lb 10.1 oz (69.2 kg)  11/11/21 155 lb (70.3 kg)   -Medications previously tried: N/A -Home BP and HR readings: Didn't have list  -Counseled on importance of adherence to anticoagulant exactly as prescribed; Sept 2023: Will ask Cardio how long patient is to be on both ASA and Eliquis  IBS-C/D  (Goal: Prevent flares) -Not ideally controlled -Current treatment  Dicyclomine 2052mID (Patient taking before meals BID/TID) Appropriate, Effective, Safe, Accessible -Medications previously tried: Eluxadoline (inHuntsman Corporationdn't cover, she didn't even complete trial)  Sept 2023: Patient very satisfied with therapy. Has only been on 2 weeks but has only had one bad day. Will be evaluated by PCP again and will defer to them  OAB (Goal: Prevent accidents) -Managed by SarSherlyn [phone] -Current treatment  Tamsulosin 0.4mg69mD Appropriate, Effective, Safe, Accessible Gemtesa 75mg86mAppropriate, Effective, Safe, Accessible Estradiol 0.01% HS Appropriate, Effective, Safe, Accessible -Medications previously tried: N/A  Sept 2023: Patient wasn't interested in speaking about this at length with me today. She is very satisfied with SarahSherlyn Leesagement   Patient Goals/Self-Care Activities Patient will:  - take medications as prescribed as evidenced by patient report and record review  Follow Up Plan: The patient has been provided with contact information for the care management team and has been advised to call with any health related questions or concerns.   CPP F/U PRN  NathaAri[place] Constablerm.D. - [phone]        Medication Assistance: None required.  Patient affirms current coverage meets needs. -Patient on Extra Help  Compliance/Adherence/Medication fill history: Star Rating Drugs:  Medication:                Last Fill:         Day Supply Atorvastatin  01/16/22-10/17/21 90ds   Care Gaps: Last Annual Wellness visit?10/11/21 Last Colonoscopy?None noted  Last Mammogram? None noted  Last Bone Denisty? 10/19/20   Immunizations:  Covid: 10/18/20 Influenza: 04/20/21 Zoster: 12/30/21 Pneumonia: 09/16/21 HPV: Aged Out  Patient's preferred pharmacy is:  [facility] [place], [place] - [address] [place] [address] Phone: [phone] Fax: [phone]  [facility] - [place], [place] - [address] [place] [address] Phone: [phone] Fax: [phone]  Uses pill box? Yes Pt endorses 100% compliance  We discussed: Benefits of medication synchronization, packaging and delivery as well as enhanced pharmacist oversight with [facility]. Patient decided to: Utilize [facility] pharmacy for medication synchronization, packaging and delivery -Verbal consent obtained for [facility] enhanced pharmacy services (medication synchronization, adherence packaging, delivery coordination). A medication sync plan was created to allow patient to get all medications delivered once every 30 to 90 days per patient preference. Patient understands they have freedom to choose pharmacy and clinical pharmacist will coordinate care between all prescribers and [facility].   Care Plan and Follow Up Patient Decision:  Patient agrees to Care Plan and Follow-up.  Plan: The patient has been provided with contact information for the care management team and has been advised to call with any health related questions or concerns.   CPP F/U PRN  [place] Constable, Pharm.D. - [phone]

## 2022-03-19 ENCOUNTER — Other Ambulatory Visit: Payer: Self-pay | Admitting: Legal Medicine

## 2022-03-21 ENCOUNTER — Other Ambulatory Visit: Payer: Medicare HMO

## 2022-03-21 DIAGNOSIS — E559 Vitamin D deficiency, unspecified: Secondary | ICD-10-CM | POA: Diagnosis not present

## 2022-03-21 DIAGNOSIS — I10 Essential (primary) hypertension: Secondary | ICD-10-CM

## 2022-03-22 ENCOUNTER — Telehealth: Payer: Self-pay | Admitting: Cardiology

## 2022-03-22 ENCOUNTER — Ambulatory Visit (INDEPENDENT_AMBULATORY_CARE_PROVIDER_SITE_OTHER): Payer: Medicare HMO | Admitting: Legal Medicine

## 2022-03-22 ENCOUNTER — Encounter: Payer: Self-pay | Admitting: Family Medicine

## 2022-03-22 ENCOUNTER — Encounter: Payer: Self-pay | Admitting: Legal Medicine

## 2022-03-22 DIAGNOSIS — K580 Irritable bowel syndrome with diarrhea: Secondary | ICD-10-CM | POA: Diagnosis not present

## 2022-03-22 DIAGNOSIS — K589 Irritable bowel syndrome without diarrhea: Secondary | ICD-10-CM | POA: Insufficient documentation

## 2022-03-22 DIAGNOSIS — I480 Paroxysmal atrial fibrillation: Secondary | ICD-10-CM

## 2022-03-22 HISTORY — DX: Irritable bowel syndrome, unspecified: K58.9

## 2022-03-22 LAB — LIPID PANEL
Chol/HDL Ratio: 2.5 ratio (ref 0.0–4.4)
Cholesterol, Total: 160 mg/dL (ref 100–199)
HDL: 64 mg/dL (ref >39)
LDL Chol Calc (NIH): 79 mg/dL (ref 0–99)
Triglycerides: 90 mg/dL (ref 0–149)
VLDL Cholesterol Cal: 17 mg/dL (ref 5–40)

## 2022-03-22 LAB — COMPREHENSIVE METABOLIC PANEL
ALT: 14 IU/L (ref 0–32)
AST: 19 IU/L (ref 0–40)
Albumin/Globulin Ratio: 2.5 — ABNORMAL HIGH (ref 1.2–2.2)
Albumin: 4.5 g/dL (ref 3.8–4.8)
Alkaline Phosphatase: 78 IU/L (ref 44–121)
BUN/Creatinine Ratio: 17 (ref 12–28)
BUN: 19 mg/dL (ref 8–27)
Bilirubin Total: 0.4 mg/dL (ref 0.0–1.2)
CO2: 21 mmol/L (ref 20–29)
Calcium: 10 mg/dL (ref 8.7–10.3)
Chloride: 102 mmol/L (ref 96–106)
Creatinine, Ser: 1.09 mg/dL — ABNORMAL HIGH (ref 0.57–1.00)
Globulin, Total: 1.8 g/dL (ref 1.5–4.5)
Glucose: 97 mg/dL (ref 70–99)
Potassium: 3.9 mmol/L (ref 3.5–5.2)
Sodium: 138 mmol/L (ref 134–144)
Total Protein: 6.3 g/dL (ref 6.0–8.5)
eGFR: 51 mL/min/{1.73_m2} — ABNORMAL LOW (ref >59)

## 2022-03-22 LAB — CBC WITH DIFF/PLATELET
Basophils Absolute: 0.1 10*3/uL (ref 0.0–0.2)
Basos: 1 %
EOS (ABSOLUTE): 0.1 10*3/uL (ref 0.0–0.4)
Eos: 2 %
Hematocrit: 36.1 % (ref 34.0–46.6)
Hemoglobin: 11.8 g/dL (ref 11.1–15.9)
Immature Grans (Abs): 0 10*3/uL (ref 0.0–0.1)
Immature Granulocytes: 0 %
Lymphocytes Absolute: 1.7 10*3/uL (ref 0.7–3.1)
Lymphs: 35 %
MCH: 30 pg (ref 26.6–33.0)
MCHC: 32.7 g/dL (ref 31.5–35.7)
MCV: 92 fL (ref 79–97)
Monocytes Absolute: 0.4 10*3/uL (ref 0.1–0.9)
Monocytes: 7 %
Neutrophils Absolute: 2.8 10*3/uL (ref 1.4–7.0)
Neutrophils: 55 %
Platelets: 255 10*3/uL (ref 150–450)
RBC: 3.93 x10E6/uL (ref 3.77–5.28)
RDW: 13.2 % (ref 11.7–15.4)
WBC: 5 10*3/uL (ref 3.4–10.8)

## 2022-03-22 LAB — CARDIOVASCULAR RISK ASSESSMENT

## 2022-03-22 LAB — VITAMIN D 25 HYDROXY (VIT D DEFICIENCY, FRACTURES): Vit D, 25-Hydroxy: 120 ng/mL — ABNORMAL HIGH (ref 30.0–100.0)

## 2022-03-22 MED ORDER — APIXABAN 5 MG PO TABS: 5.0000 mg | ORAL_TABLET | Freq: Two times a day (BID) | ORAL | 1 refills | Status: DC

## 2022-03-22 NOTE — Progress Notes (Signed)
Subjective:  Patient ID: Shannon Martin, female    DOB: 09-07-1940  Age: 81 y.o. MRN: [id number]  Chief Complaint  Patient presents with   Irritable Bowel Syndrome    HPI   Patient was seen 2 weeks ago for IBS. She started  Bentyl 20 mg twice a day. She is doing better. She is having less loose bm, brown stools.  No blood.  Current Outpatient Medications on File Prior to Visit  Medication Sig Dispense Refill   albuterol (VENTOLIN HFA) 108 (90 Base) MCG/ACT inhaler Inhale two puffs every four to six hours as needed for cough or wheeze. 8 g 3   aspirin EC 81 MG tablet Take 81 mg by mouth daily.     atorvastatin (LIPITOR) 40 MG tablet Take 1 tablet (40 mg total) by mouth daily. 90 tablet 3   budesonide-formoterol (SYMBICORT) 80-4.5 MCG/ACT inhaler INHALE 2 PUFFS INTO LUNGS TWICE DAILY, IN THE MORNING AND EVENING 30.6 g 6   Calcium Carb-Cholecalciferol (CALCIUM + D3) 600-200 MG-UNIT TABS Take 1 tablet by mouth 2 (two) times daily.     celecoxib (CELEBREX) 100 MG capsule TAKE 1 CAPSULE(100 MG) BY MOUTH TWICE DAILY 180 capsule 2   Cholecalciferol (D3-1000) 25 MCG (1000 UT) capsule Take 1,000 Units by mouth daily.     dicyclomine (BENTYL) 20 MG tablet Take 1 tablet (20 mg total) by mouth 4 (four) times daily -  before meals and at bedtime. (Patient taking differently: Take 20 mg by mouth 2 (two) times daily.) 60 tablet 1   estradiol (ESTRACE) 0.1 MG/GM vaginal cream Place 1 Applicatorful vaginally at bedtime.     famotidine (PEPCID) 40 MG tablet TAKE 1 TABLET(40 MG) BY MOUTH TWICE DAILY 180 tablet 2   fluticasone (FLONASE) 50 MCG/ACT nasal spray Can use one spray in each nostril once daily as directed. 48 g 1   GEMTESA 75 MG TABS Take 1 tablet by mouth daily. 90 tablet 2   Hypromellose (ARTIFICIAL TEARS OP) Apply 1 drop to eye as needed for dry eyes.     loratadine (CLARITIN) 10 MG tablet Take 10 mg by mouth daily.     Multiple Vitamins-Minerals (MULTIVITAMIN PO) Take 1 tablet by mouth daily.      tamsulosin (FLOMAX) 0.4 MG CAPS capsule Take 0.4 mg by mouth daily.     VITAMIN E PO Take 400 Units by mouth 2 (two) times daily.     No current facility-administered medications on file prior to visit.   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   Laryngopharyngeal reflux (LPR) 03/24/2015   Left knee pain 10/01/2014   Memory changes 09/16/2021   Migraine without aura and without status migrainosus, not intractable 07/28/2019   Migraines 1943 to present   "fairly frequently"; Takes Propranolol (01/12/2015)   Mixed hyperlipidemia 12/11/2018   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   Overactive bladder    Takes Vesicare   PAF (paroxysmal atrial fibrillation) (HCC) 05/04/2021   Palpitations 04/08/2021   Primary osteoarthritis of left knee 12/03/2014   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   Seborrheic dermatitis 12/21/2020   Syncope and collapse 12/11/2018   Past Surgical History:  Procedure Laterality Date  BREAST BIOPSY Left ~ 1973   CATARACT EXTRACTION W/ INTRAOCULAR LENS  IMPLANT, BILATERAL Bilateral 2003   CHEST TUBE INSERTION  January 2015   X 2 at [facility]  4-5   JOINT REPLACEMENT     REPLACEMENT TOTAL KNEE Left    TONSILLECTOMY AND ADENOIDECTOMY  1946; 1947   TOTAL KNEE ARTHROPLASTY Left 01/11/2015   TOTAL KNEE ARTHROPLASTY Left 01/11/2015   Procedure: Left TOTAL KNEE ARTHROPLASTY;  Surgeon: Vickey Huger, MD;  Location: [place];  Service: Orthopedics;  Laterality: Left;   VIDEO ASSISTED THORACOSCOPY (VATS)/THOROCOTOMY Left 07/13/2013   VATS with insertion of chest tubes    Family History   Problem Relation Age of Onset   Hypertension Mother    Diabetes Father    Hypertension Maternal Grandmother    Hypertension Maternal Grandfather    Social History   Socioeconomic History   Marital status: Widowed    Spouse name: Not on file   Number of children: 1   Years of education: Not on file   Highest education level: Not on file  Occupational History   Not on file  Tobacco Use   Smoking status: Never   Smokeless tobacco: Never  Vaping Use   Vaping Use: Never used  Substance and Sexual Activity   Alcohol use: Yes    Alcohol/week: 2.0 standard drinks of alcohol    Types: 2 Glasses of wine per week   Drug use: No   Sexual activity: Not Currently  Other Topics Concern   Not on file  Social History Narrative   Daughter lives nearby, granddaughter lives in [place] - married 3 times, all deceased.  Has not been in contact with her sister in >20 years   Social Determinants of Health   Financial Resource Strain: Low Risk  (03/14/2022)   Overall Financial Resource Strain (CARDIA)    Difficulty of Paying Living Expenses: Not hard at all  Food Insecurity: No Food Insecurity (10/11/2021)   Hunger Vital Sign    Worried About Running Out of Food in the Last Year: Never true    Ran Out of Food in the Last Year: Never true  Transportation Needs: No Transportation Needs (03/14/2022)   PRAPARE - Hydrologist (Medical): No    Lack of Transportation (Non-Medical): No  Physical Activity: Insufficiently Active (10/11/2021)   Exercise Vital Sign    Days of Exercise per Week: 2 days    Minutes of Exercise per Session: 10 min  Stress: No Stress Concern Present (10/11/2021)   [place]    Feeling of Stress : Not at all  Social Connections: Unknown (10/11/2021)   Social Connection and Isolation Panel [NHANES]    Frequency of Communication with Friends and Family: More than three times a  week    Frequency of Social Gatherings with Friends and Family: More than three times a week    Attends Religious Services: Not on file    Active Member of Clubs or Organizations: No    Attends Archivist Meetings: Never    Marital Status: Widowed    Review of Systems  Constitutional:  Negative for chills, fatigue and fever.  HENT:  Negative for congestion, ear pain and sore throat.   Eyes:  Negative for visual disturbance.  Respiratory:  Negative for cough and shortness of breath.   Cardiovascular:  Negative for chest pain and palpitations.  Gastrointestinal:  Positive for diarrhea. Negative for abdominal pain, constipation, nausea and vomiting.  Endocrine: Negative for polydipsia, polyphagia and polyuria.  Genitourinary:  Negative for difficulty urinating and dysuria.  Musculoskeletal:  Negative for arthralgias, back pain and myalgias.  Skin:  Negative for rash.  Neurological:  Negative for headaches.  Psychiatric/Behavioral:  Negative for dysphoric mood. The patient is not nervous/anxious.      Objective:  BP 135/76   Pulse (!) 56   Temp 98.4 F (36.9 C)   Resp 15   Ht 4' 11.6" (1.514 m)   Wt 141 lb (64 kg)   SpO2 98%   BMI 27.91 kg/m      03/22/2022    2:40 PM 02/15/2022    3:17 PM 11/14/2021    9:36 AM  BP/Weight  Systolic BP [id number]  Diastolic BP 76 64 60  Wt. (Lbs) 141 145 152.63  BMI 27.91 kg/m2 28.7 kg/m2 30.21 kg/m2    Physical Exam Vitals reviewed.  Constitutional:      General: She is not in acute distress.    Appearance: Normal appearance.  HENT:     Head: Normocephalic.     Right Ear: Tympanic membrane normal.     Left Ear: Tympanic membrane normal.     Mouth/Throat:     Mouth: Mucous membranes are moist.     Pharynx: Oropharynx is clear.  Eyes:     Extraocular Movements: Extraocular movements intact.     Conjunctiva/sclera: Conjunctivae normal.     Pupils: Pupils are equal, round, and reactive to light.  Cardiovascular:      Rate and Rhythm: Normal rate and regular rhythm.     Pulses: Normal pulses.     Heart sounds: Normal heart sounds. No murmur heard.    No gallop.  Pulmonary:     Effort: Pulmonary effort is normal. No respiratory distress.     Breath sounds: Normal breath sounds. No wheezing.  Abdominal:     General: Abdomen is flat. Bowel sounds are normal. There is no distension.     Tenderness: There is no abdominal tenderness.  Musculoskeletal:     Cervical back: Normal range of motion and neck supple.  Skin:    General: Skin is warm.     Capillary Refill: Capillary refill takes less than 2 seconds.  Neurological:     General: No focal deficit present.     Mental Status: She is alert and oriented to person, place, and time. Mental status is at baseline.         Lab Results  Component Value Date   WBC 5.0 03/21/2022   HGB 11.8 03/21/2022   HCT 36.1 03/21/2022   PLT 255 03/21/2022   GLUCOSE 97 03/21/2022   CHOL 160 03/21/2022   TRIG 90 03/21/2022   HDL 64 03/21/2022   LDLCALC 79 03/21/2022   ALT 14 03/21/2022   AST 19 03/21/2022   NA 138 03/21/2022   K 3.9 03/21/2022   CL 102 03/21/2022   CREATININE 1.09 (H) 03/21/2022   BUN 19 03/21/2022   CO2 21 03/21/2022   TSH 1.290 04/08/2021   INR 1.02 01/01/2015      Assessment & Plan:   Problem List Items Addressed This Visit       Digestive   IBS (irritable bowel syndrome) IBS is doing well, little diarrhea, she did not have to start viberzi and is using bentyl  .       Follow-up: Return as scheduled.  An After Visit  Summary was printed and given to the patient.  Reinaldo Meeker, MD [facility] [phone]

## 2022-03-22 NOTE — Progress Notes (Signed)
Kidney test still stage 3a, liver tests normal, Vitamin D 120 high, CBC normal, Cholesterol normal,  lp

## 2022-03-22 NOTE — Telephone Encounter (Signed)
*  STAT* If patient is at the pharmacy, call can be transferred to refill team.   1. Which medications need to be refilled? (please list name of each medication and dose if known) apixaban (ELIQUIS) 5 MG TABS tablet  2. Which pharmacy/location (including street and city if local pharmacy) is medication to be sent to? [facility] - [place], [place] - [address]  3. Do they need a 30 day or 90 day supply? 90 day  

## 2022-03-22 NOTE — Telephone Encounter (Signed)
Prescription refill request for Eliquis received. Indication: Afib  Last office visit:11/14/21 (Revankar)  Scr: 1.09 (03/21/22)  Age: 81 Weight: 65.8kg  Appropriate dose and refill sent to requested pharmacy.

## 2022-03-24 ENCOUNTER — Telehealth: Payer: Self-pay

## 2022-03-24 NOTE — Telephone Encounter (Signed)
-----   Message from Jenean Lindau, MD sent at 03/15/2022 12:53 PM EDT ----- Reuben Likes please get the patient for carotid ultrasound and then we will decide if the patient had carotid atherosclerosis by evaluation in 2021 ----- Message ----- From: Lane Hacker, [facility] Sent: 03/14/2022   3:39 PM EDT To: Jenean Lindau, MD  Howdy, patient is on both ASA and Eliquis. I was wondering how long you wanted her to be on both for. Thank you and I hope you have a wonderful day!

## 2022-03-25 DIAGNOSIS — E785 Hyperlipidemia, unspecified: Secondary | ICD-10-CM

## 2022-03-25 DIAGNOSIS — I4891 Unspecified atrial fibrillation: Secondary | ICD-10-CM | POA: Diagnosis not present

## 2022-03-27 ENCOUNTER — Other Ambulatory Visit: Payer: Self-pay

## 2022-03-27 DIAGNOSIS — J4540 Moderate persistent asthma, uncomplicated: Secondary | ICD-10-CM

## 2022-03-27 DIAGNOSIS — J3089 Other allergic rhinitis: Secondary | ICD-10-CM

## 2022-03-27 DIAGNOSIS — E782 Mixed hyperlipidemia: Secondary | ICD-10-CM

## 2022-03-27 DIAGNOSIS — M159 Polyosteoarthritis, unspecified: Secondary | ICD-10-CM

## 2022-03-27 MED ORDER — FLUTICASONE PROPIONATE 50 MCG/ACT NA SUSP: NASAL | 1 refills | Status: DC

## 2022-03-27 MED ORDER — BUDESONIDE-FORMOTEROL FUMARATE 80-4.5 MCG/ACT IN AERO: INHALATION_SPRAY | RESPIRATORY_TRACT | 6 refills | Status: DC

## 2022-03-27 MED ORDER — ATORVASTATIN CALCIUM 40 MG PO TABS: 40.0000 mg | ORAL_TABLET | Freq: Every day | ORAL | 3 refills | Status: DC

## 2022-03-27 MED ORDER — CELECOXIB 100 MG PO CAPS: ORAL_CAPSULE | ORAL | 2 refills | Status: DC

## 2022-03-27 MED ORDER — ALBUTEROL SULFATE HFA 108 (90 BASE) MCG/ACT IN AERS: INHALATION_SPRAY | RESPIRATORY_TRACT | 3 refills | Status: DC

## 2022-03-27 MED ORDER — DICYCLOMINE HCL 20 MG PO TABS: 20.0000 mg | ORAL_TABLET | Freq: Three times a day (TID) | ORAL | 1 refills | Status: DC

## 2022-03-27 MED ORDER — FAMOTIDINE 40 MG PO TABS: ORAL_TABLET | ORAL | 2 refills | Status: DC

## 2022-03-27 NOTE — Progress Notes (Signed)
Subjective:  Patient ID: Shannon Martin, female    DOB: September 10, 1940  Age: 81 y.o. MRN: [id number]  Chief Complaint  Patient presents with   Hypertension   Irritable Bowel Syndrome    HPI: chronic   Patient presents with hyperlipidemia.  Compliance with treatment has been good; patient takes medicines as directed, maintains low cholesterol diet, follows up as directed, and maintains exercise regimen.  Patient is using Atorvastatin 40 mg daily without problems.   Asthma: Using Symbicort 80 mg inhaler 2 puffs into lungs twice a day.  IBS: Taking Bentyl 20 mg BID   Overactive bladder: Gemtesa 75 mg daily, Tamsulosin 0.4 mg daily. Current Outpatient Medications on File Prior to Visit  Medication Sig Dispense Refill   albuterol (VENTOLIN HFA) 108 (90 Base) MCG/ACT inhaler Inhale two puffs every four to six hours as needed for cough or wheeze. 8 g 3   apixaban (ELIQUIS) 5 MG TABS tablet Take 1 tablet (5 mg total) by mouth 2 (two) times daily. 180 tablet 1   aspirin EC 81 MG tablet Take 81 mg by mouth daily.     atorvastatin (LIPITOR) 40 MG tablet Take 1 tablet (40 mg total) by mouth daily. 90 tablet 3   budesonide-formoterol (SYMBICORT) 80-4.5 MCG/ACT inhaler INHALE 2 PUFFS INTO LUNGS TWICE DAILY, IN THE MORNING AND EVENING 30.6 g 6   Calcium Carb-Cholecalciferol (CALCIUM + D3) 600-200 MG-UNIT TABS Take 1 tablet by mouth 2 (two) times daily.     celecoxib (CELEBREX) 100 MG capsule TAKE 1 CAPSULE(100 MG) BY MOUTH TWICE DAILY 180 capsule 2   Cholecalciferol (D3-1000) 25 MCG (1000 UT) capsule Take 1,000 Units by mouth daily.     estradiol (ESTRACE) 0.1 MG/GM vaginal cream Place 1 Applicatorful vaginally at bedtime.     famotidine (PEPCID) 40 MG tablet TAKE 1 TABLET(40 MG) BY MOUTH TWICE DAILY 180 tablet 2   fluticasone (FLONASE) 50 MCG/ACT nasal spray Can use one spray in each nostril once daily as directed. 48 g 1   GEMTESA 75 MG TABS Take 1 tablet by mouth daily. 90 tablet 2   Hypromellose  (ARTIFICIAL TEARS OP) Apply 1 drop to eye as needed for dry eyes.     loratadine (CLARITIN) 10 MG tablet Take 10 mg by mouth daily.     Multiple Vitamins-Minerals (MULTIVITAMIN PO) Take 1 tablet by mouth daily.     tamsulosin (FLOMAX) 0.4 MG CAPS capsule Take 0.4 mg by mouth daily.     VITAMIN E PO Take 400 Units by mouth 2 (two) times daily.     No current facility-administered medications on file prior to visit.   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   Laryngopharyngeal reflux (LPR) 03/24/2015   Left knee pain 10/01/2014   Memory changes 09/16/2021   Migraine without aura and without status migrainosus, not intractable 07/28/2019   Migraines 1943 to present   "fairly frequently"; Takes Propranolol (01/12/2015)   Mixed hyperlipidemia 12/11/2018   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   Overactive bladder    Takes Vesicare   PAF (paroxysmal atrial fibrillation) (HCC) 05/04/2021   Palpitations 04/08/2021   Primary osteoarthritis of left knee 12/03/2014  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   Seborrheic dermatitis 12/21/2020   Syncope and collapse 12/11/2018   Past Surgical History:  Procedure Laterality Date   BREAST BIOPSY Left ~ 1973   CATARACT EXTRACTION W/ INTRAOCULAR LENS  IMPLANT, BILATERAL Bilateral 2003   CHEST TUBE INSERTION  January 2015   X 2 at [facility]   DILATION AND CURETTAGE OF UTERUS  4-5   JOINT REPLACEMENT     REPLACEMENT TOTAL KNEE Left    TONSILLECTOMY AND ADENOIDECTOMY  1946; 1947   TOTAL KNEE ARTHROPLASTY Left 01/11/2015   TOTAL KNEE ARTHROPLASTY Left 01/11/2015   Procedure: Left TOTAL KNEE ARTHROPLASTY;   Surgeon: Dannielle Huh, MD;  Location: [facility];  Service: Orthopedics;  Laterality: Left;   VIDEO ASSISTED THORACOSCOPY (VATS)/THOROCOTOMY Left 07/13/2013   VATS with insertion of chest tubes    Family History  Problem Relation Age of Onset   Hypertension Mother    Diabetes Father    Hypertension Maternal Grandmother    Hypertension Maternal Grandfather    Social History   Socioeconomic History   Marital status: Widowed    Spouse name: Not on file   Number of children: 1   Years of education: Not on file   Highest education level: Not on file  Occupational History   Not on file  Tobacco Use   Smoking status: Never   Smokeless tobacco: Never  Vaping Use   Vaping Use: Never used  Substance and Sexual Activity   Alcohol use: Yes    Alcohol/week: 2.0 standard drinks of alcohol    Types: 2 Glasses of wine per week   Drug use: No   Sexual activity: Not Currently  Other Topics Concern   Not on file  Social History Narrative   Daughter lives nearby, granddaughter lives in [place] - married 3 times, all deceased.  Has not been in contact with her sister in >20 years   Social Determinants of Health   Financial Resource Strain: Low Risk  (03/14/2022)   Overall Financial Resource Strain (CARDIA)    Difficulty of Paying Living Expenses: Not hard at all  Food Insecurity: No Food Insecurity (10/11/2021)   Hunger Vital Sign    Worried About Running Out of Food in the Last Year: Never true    Ran Out of Food in the Last Year: Never true  Transportation Needs: No Transportation Needs (03/14/2022)   PRAPARE - Administrator, Civil Service (Medical): No    Lack of Transportation (Non-Medical): No  Physical Activity: Insufficiently Active (10/11/2021)   Exercise Vital Sign    Days of Exercise per Week: 2 days    Minutes of Exercise per Session: 10 min  Stress: No Stress Concern Present (10/11/2021)   [employer] of Occupational Health - Occupational Stress Questionnaire     Feeling of Stress : Not at all  Social Connections: Unknown (10/11/2021)   Social Connection and Isolation Panel [NHANES]    Frequency of Communication with Friends and Family: More than three times a week    Frequency of Social Gatherings with Friends and Family: More than three times a week    Attends Religious Services: Not on file    Active Member of Clubs or Organizations: No    Attends Banker Meetings: Never    Marital Status: Widowed    Review of Systems  Constitutional:  Negative for chills, fatigue and fever.  HENT:  Negative for congestion, ear pain and sore throat.   Eyes:  Negative for visual disturbance.  Respiratory:  Negative for cough and shortness of breath.   Cardiovascular:  Negative for chest pain and palpitations.  Gastrointestinal:  Negative for abdominal pain, constipation, diarrhea, nausea and vomiting.  Endocrine: Negative for polydipsia, polyphagia and polyuria.  Genitourinary:  Negative for difficulty urinating and dysuria.  Musculoskeletal:  Negative for arthralgias, back pain and myalgias.  Skin:  Negative for rash.  Neurological:  Negative for headaches.  Hematological: Negative.   Psychiatric/Behavioral:  Negative for dysphoric mood. The patient is not nervous/anxious.      Objective:  BP 135/74   Pulse 61   Temp (!) 96.7 F (35.9 C)   Resp 15   Ht 4' 11.6" (1.514 m)   Wt 141 lb (64 kg)   SpO2 98%   BMI 27.91 kg/m      03/28/2022    8:47 AM 03/22/2022    2:40 PM 02/15/2022    3:17 PM  BP/Weight  Systolic BP 135 135 108  Diastolic BP 74 76 64  Wt. (Lbs) 141 141 145  BMI 27.91 kg/m2 27.91 kg/m2 28.7 kg/m2      03/28/2022    9:07 AM 10/11/2021    9:28 AM 10/04/2021   10:39 AM 09/16/2021    8:44 AM 03/18/2021    9:02 AM  Depression screen PHQ 2/9  Decreased Interest 0 0 0 0 0  Down, Depressed, Hopeless 0 0 0 0 0  PHQ - 2 Score 0 0 0 0 0  Altered sleeping 3 0  0 0  Tired, decreased energy 0 0  0 0  Change in appetite 0  0  0 0  Feeling bad or failure about yourself  0 0  0 0  Trouble concentrating  0  0 0  Moving slowly or fidgety/restless 0 0  0 0  Suicidal thoughts 0 0  0 0  PHQ-9 Score 3 0  0 0  Difficult doing work/chores Not difficult at all Not difficult at all  Not difficult at all Not difficult at all      Physical Exam Vitals reviewed.  Constitutional:      Appearance: Normal appearance.  HENT:     Head: Normocephalic and atraumatic.     Right Ear: Tympanic membrane normal.     Left Ear: Tympanic membrane normal.     Nose: Nose normal.     Mouth/Throat:     Mouth: Mucous membranes are moist.     Pharynx: Oropharynx is clear.  Eyes:     Extraocular Movements: Extraocular movements intact.     Conjunctiva/sclera: Conjunctivae normal.     Pupils: Pupils are equal, round, and reactive to light.  Cardiovascular:     Rate and Rhythm: Normal rate and regular rhythm.     Pulses: Normal pulses.     Heart sounds: Normal heart sounds. No murmur heard.    No gallop.  Pulmonary:     Effort: Pulmonary effort is normal. No respiratory distress.     Breath sounds: Normal breath sounds. No wheezing.  Abdominal:     General: Abdomen is flat. Bowel sounds are normal. There is no distension.     Palpations: Abdomen is soft.     Tenderness: There is no abdominal tenderness.  Musculoskeletal:     Cervical back: Normal range of motion and neck supple.     Comments: Left great toe bunion with overlap 2nd toe  Skin:    General: Skin is warm.     Capillary Refill:  Capillary refill takes less than 2 seconds.  Neurological:     General: No focal deficit present.     Mental Status: She is alert and oriented to person, place, and time. Mental status is at baseline.     Gait: Gait normal.         Lab Results  Component Value Date   WBC 5.0 03/21/2022   HGB 11.8 03/21/2022   HCT 36.1 03/21/2022   PLT 255 03/21/2022   GLUCOSE 97 03/21/2022   CHOL 160 03/21/2022   TRIG 90 03/21/2022   HDL 64  03/21/2022   LDLCALC 79 03/21/2022   ALT 14 03/21/2022   AST 19 03/21/2022   NA 138 03/21/2022   K 3.9 03/21/2022   CL 102 03/21/2022   CREATININE 1.09 (H) 03/21/2022   BUN 19 03/21/2022   CO2 21 03/21/2022   TSH 1.290 04/08/2021   INR 1.02 01/01/2015      Assessment & Plan:   Problem List Items Addressed This Visit       Cardiovascular and Mediastinum   Migraine without aura and without status migrainosus, not intractable (Chronic) Patient having improvement in migraines    Essential hypertension - Primary An individual hypertension care plan was established and reinforced today.  The patient's status was assessed using clinical findings on exam and labs or diagnostic tests. The patient's success at meeting treatment goals on disease specific evidence-based guidelines and found to be well controlled. SELF MANAGEMENT: The patient and I together assessed ways to personally work towards obtaining the recommended goals. RECOMMENDATIONS: avoid decongestants found in common cold remedies, decrease consumption of alcohol, perform routine monitoring of BP with home BP cuff, exercise, reduction of dietary salt, take medicines as prescribed, try not to miss doses and quit smoking.  Regular exercise and maintaining a healthy weight is needed.  Stress reduction may help. A CLINICAL SUMMARY including written plan identify barriers to care unique to individual due to social or financial issues.  We attempt to mutually creat solutions for individual and family understanding.     PAF (paroxysmal atrial fibrillation) ([place]) Patient has a diagnosis of paroxysmal atrial fibrillation.   Patient is on eliquis and has controlled ventricular response.  Patient is CV stable.      Respiratory   Moderate persistent asthma Asthma is stable     Digestive   IBS (irritable bowel syndrome)   Relevant Medications   dicyclomine (BENTYL) 20 MG tablet IBS controlled by bentyl     Genitourinary   Overactive  bladder Patient has OAB, samples gemtessa given     Other   Mixed hyperlipidemia (Chronic) AN INDIVIDUAL CARE PLAN for hyperlipidemia/ cholesterol was established and reinforced today.  The patient's status was assessed using clinical findings on exam, lab and other diagnostic tests. The patient's disease status was assessed based on evidence-based guidelines and found to be fair controlled. MEDICATIONS were reviewed. SELF MANAGEMENT GOALS have been discussed and patient's success at attaining the goal of low cholesterol was assessed. RECOMMENDATION given include regular exercise 3 days a week and low cholesterol/low fat diet. CLINICAL SUMMARY including written plan to identify barriers unique to the patient due to social or economic  reasons was discussed.    BMI 27.0-27.9,adult We discussed diet and exercise  .       Follow-up: Return in about 6 months (around 09/27/2022).  An After Visit Summary was printed and given to the patient.  Reinaldo Meeker, MD [facility] [phone]

## 2022-03-28 ENCOUNTER — Encounter: Payer: Self-pay | Admitting: Legal Medicine

## 2022-03-28 ENCOUNTER — Ambulatory Visit (INDEPENDENT_AMBULATORY_CARE_PROVIDER_SITE_OTHER): Payer: Medicare HMO | Admitting: Legal Medicine

## 2022-03-28 VITALS — BP 135/74 | HR 61 | Temp 96.7°F | Resp 15 | Ht 59.6 in | Wt 141.0 lb

## 2022-03-28 DIAGNOSIS — I10 Essential (primary) hypertension: Secondary | ICD-10-CM | POA: Diagnosis not present

## 2022-03-28 DIAGNOSIS — I480 Paroxysmal atrial fibrillation: Secondary | ICD-10-CM

## 2022-03-28 DIAGNOSIS — J4540 Moderate persistent asthma, uncomplicated: Secondary | ICD-10-CM

## 2022-03-28 DIAGNOSIS — N3281 Overactive bladder: Secondary | ICD-10-CM | POA: Diagnosis not present

## 2022-03-28 DIAGNOSIS — K580 Irritable bowel syndrome with diarrhea: Secondary | ICD-10-CM

## 2022-03-28 DIAGNOSIS — G43009 Migraine without aura, not intractable, without status migrainosus: Secondary | ICD-10-CM

## 2022-03-28 DIAGNOSIS — E782 Mixed hyperlipidemia: Secondary | ICD-10-CM | POA: Diagnosis not present

## 2022-03-28 DIAGNOSIS — Z6827 Body mass index (BMI) 27.0-27.9, adult: Secondary | ICD-10-CM

## 2022-03-28 MED ORDER — DICYCLOMINE HCL 20 MG PO TABS: 20.0000 mg | ORAL_TABLET | Freq: Three times a day (TID) | ORAL | 2 refills | Status: DC

## 2022-03-31 ENCOUNTER — Telehealth: Payer: Self-pay

## 2022-03-31 NOTE — Chronic Care Management (AMB) (Signed)
Called patient to confirm how many Symbicort inhalers she had on hand  to coordination with [facility] delivery. Patient stated she has 2 unopened inhalers, she takes 2 puffs twice a day and each inhaler has 120 doses. Patient will not need a new refill until December.   [facility] notified.    Pattricia Boss, [place] Pharmacist Assistant [phone]

## 2022-04-13 ENCOUNTER — Telehealth: Payer: Self-pay

## 2022-04-13 NOTE — Chronic Care Management (AMB) (Signed)
Medication Review:  Patient called and left a message regarding Dicyclomine that was recently prescribed to her from Dr Henrene Pastor, she stated she had a refill left on medication that was good until 02/28/2023 and she has enough to last her for 2 more weeks and she is receiving calls from [facility] to refill medication.   Called patient she just wanted to let us know that she was getting call from [facility] to fill Dicyclomine but she does not need any at this time, she was prescribed enough to take medication 4 times and is now only taking 2 tablets a day which is working really well. Patient counted pills and has 2 weeks of medication left and she no longer uses [facility], has transferred to [facility] now and not sure why [facility] is still calling her. Patient aware I will call [facility] to have them transfer Dicyclomine 20 mg refills over to [facility] and notify them that she is no longer using [facility] for her maintenance medications.  Called [facility] and spoke with Pharmacist Retta Diones, they are transferring over dicyclomine (BENTYL) 20 MG,  Take 1 tablet (20 mg total) by mouth 4 (four) times daily -  before meals and at bedtime. #180 2 RF to [facility], [id number]. Informed  [facility] of transfer and that patient does not need a refill at this time, she has 2 weeks of medication left, updating onboard sheet, not needed for delivery until 04/24/2022 if patient still needs at that time.   Pattricia Boss, [place] Pharmacist Assistant [phone]

## 2022-04-17 ENCOUNTER — Other Ambulatory Visit: Payer: Self-pay | Admitting: Legal Medicine

## 2022-04-17 DIAGNOSIS — K580 Irritable bowel syndrome with diarrhea: Secondary | ICD-10-CM

## 2022-04-25 ENCOUNTER — Ambulatory Visit (INDEPENDENT_AMBULATORY_CARE_PROVIDER_SITE_OTHER): Payer: Medicare HMO

## 2022-04-25 DIAGNOSIS — N952 Postmenopausal atrophic vaginitis: Secondary | ICD-10-CM | POA: Diagnosis not present

## 2022-04-25 DIAGNOSIS — Z23 Encounter for immunization: Secondary | ICD-10-CM | POA: Diagnosis not present

## 2022-04-25 DIAGNOSIS — R339 Retention of urine, unspecified: Secondary | ICD-10-CM | POA: Diagnosis not present

## 2022-04-25 DIAGNOSIS — N3281 Overactive bladder: Secondary | ICD-10-CM | POA: Diagnosis not present

## 2022-05-16 ENCOUNTER — Encounter: Payer: Self-pay | Admitting: Legal Medicine

## 2022-05-16 ENCOUNTER — Ambulatory Visit (INDEPENDENT_AMBULATORY_CARE_PROVIDER_SITE_OTHER): Payer: Medicare HMO | Admitting: Legal Medicine

## 2022-05-16 VITALS — BP 128/68 | HR 78 | Temp 97.8°F | Ht 59.6 in | Wt 142.0 lb

## 2022-05-16 DIAGNOSIS — F419 Anxiety disorder, unspecified: Secondary | ICD-10-CM | POA: Diagnosis not present

## 2022-05-16 DIAGNOSIS — F411 Generalized anxiety disorder: Secondary | ICD-10-CM

## 2022-05-16 DIAGNOSIS — R151 Fecal smearing: Secondary | ICD-10-CM | POA: Insufficient documentation

## 2022-05-16 HISTORY — DX: Generalized anxiety disorder: F41.1

## 2022-05-16 HISTORY — DX: Fecal smearing: R15.1

## 2022-05-16 HISTORY — DX: Anxiety disorder, unspecified: F41.9

## 2022-05-16 MED ORDER — ALPRAZOLAM 0.25 MG PO TABS: 0.2500 mg | ORAL_TABLET | Freq: Two times a day (BID) | ORAL | 3 refills | Status: DC | PRN

## 2022-05-16 NOTE — Progress Notes (Signed)
Acute Office Visit  Subjective:    Patient ID: Shannon Martin, female    DOB: September 18, 1940, 81 y.o.   MRN: [id number]  Chief Complaint  Patient presents with   Nervous/Anxiety    HPI Patient is in today for anxiety, she has experienced 3 attemped break-ins. She can only sleep 4 hours at night and she is anxious all the time.  She has tried to effect changes in locks but to no one listening.  She is having fecal soilage constantly. Needs GI consult. If not get surgery consult.  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   Laryngopharyngeal reflux (LPR) 03/24/2015   Left knee pain 10/01/2014   Memory changes 09/16/2021   Migraine without aura and without status migrainosus, not intractable 07/28/2019   Migraines 1943 to present   "fairly frequently"; Takes Propranolol (01/12/2015)   Mixed hyperlipidemia 12/11/2018   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   Overactive bladder    Takes Vesicare   PAF (paroxysmal atrial fibrillation) ([place]) 05/04/2021   Palpitations 04/08/2021   Primary osteoarthritis of left knee 12/03/2014   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   Seborrheic dermatitis 12/21/2020   Syncope and collapse 12/11/2018    Past Surgical History:  Procedure Laterality Date   BREAST BIOPSY Left ~ [place], BILATERAL Bilateral 2003   CHEST TUBE INSERTION  January 2015   X 2 at [place]  4-5   JOINT REPLACEMENT     REPLACEMENT TOTAL KNEE Left    TONSILLECTOMY AND ADENOIDECTOMY  1946; 1947   TOTAL KNEE ARTHROPLASTY Left 01/11/2015   TOTAL KNEE  ARTHROPLASTY Left 01/11/2015   Procedure: Left TOTAL KNEE ARTHROPLASTY;  Surgeon: Vickey Huger, MD;  Location: [place];  Service: Orthopedics;  Laterality: Left;   VIDEO ASSISTED THORACOSCOPY (VATS)/THOROCOTOMY Left 07/13/2013   VATS with insertion of chest tubes    Family History  Problem Relation Age of Onset   Hypertension Mother    Diabetes Father    Hypertension Maternal Grandmother    Hypertension Maternal Grandfather     Social History   Socioeconomic History   Marital status: Widowed    Spouse name: Not on file   Number of children: 1   Years of education: Not on file   Highest education level: Not on file  Occupational History   Not on file  Tobacco Use   Smoking status: Never   Smokeless tobacco: Never  Vaping Use   Vaping Use: Never used  Substance and Sexual Activity   Alcohol use: Yes    Alcohol/week: 2.0 standard drinks of alcohol    Types: 2 Glasses of wine per week   Drug use: No   Sexual activity: Not Currently  Other Topics Concern   Not on file  Social History Narrative   Daughter lives nearby, granddaughter lives in [place] - married 3 times, all deceased.  Has not been in contact with her sister in >20 years   Social Determinants of Health   Financial Resource Strain: Low Risk  (03/14/2022)   Overall Financial Resource Strain (CARDIA)    Difficulty  of Paying Living Expenses: Not hard at all  Food Insecurity: No Food Insecurity (10/11/2021)   Hunger Vital Sign    Worried About Running Out of Food in the Last Year: Never true    Ran Out of Food in the Last Year: Never true  Transportation Needs: No Transportation Needs (03/14/2022)   PRAPARE - Hydrologist (Medical): No    Lack of Transportation (Non-Medical): No  Physical Activity: Insufficiently Active (10/11/2021)   Exercise Vital Sign    Days of Exercise per Week: 2 days    Minutes of Exercise per Session: 10 min  Stress: No Stress Concern Present (10/11/2021)    [place]    Feeling of Stress : Not at all  Social Connections: Unknown (10/11/2021)   Social Connection and Isolation Panel [NHANES]    Frequency of Communication with Friends and Family: More than three times a week    Frequency of Social Gatherings with Friends and Family: More than three times a week    Attends Religious Services: Not on file    Active Member of Clubs or Organizations: No    Attends Archivist Meetings: Never    Marital Status: Widowed  Intimate Partner Violence: Not on file    Outpatient Medications Prior to Visit  Medication Sig Dispense Refill   albuterol (VENTOLIN HFA) 108 (90 Base) MCG/ACT inhaler Inhale two puffs every four to six hours as needed for cough or wheeze. 8 g 3   apixaban (ELIQUIS) 5 MG TABS tablet Take 1 tablet (5 mg total) by mouth 2 (two) times daily. 180 tablet 1   aspirin EC 81 MG tablet Take 81 mg by mouth daily.     atorvastatin (LIPITOR) 40 MG tablet Take 1 tablet (40 mg total) by mouth daily. 90 tablet 3   budesonide-formoterol (SYMBICORT) 80-4.5 MCG/ACT inhaler INHALE 2 PUFFS INTO LUNGS TWICE DAILY, IN THE MORNING AND EVENING 30.6 g 6   Calcium Carb-Cholecalciferol (CALCIUM + D3) 600-200 MG-UNIT TABS Take 1 tablet by mouth 2 (two) times daily.     celecoxib (CELEBREX) 100 MG capsule TAKE 1 CAPSULE(100 MG) BY MOUTH TWICE DAILY 180 capsule 2   Cholecalciferol (D3-1000) 25 MCG (1000 UT) capsule Take 1,000 Units by mouth daily.     dicyclomine (BENTYL) 20 MG tablet TAKE ONE TABLET BY MOUTH FOUR TIMES DAILY BEFORE MEALS AND AT BEDTIME 60 tablet 1   estradiol (ESTRACE) 0.1 MG/GM vaginal cream Place 1 Applicatorful vaginally at bedtime.     famotidine (PEPCID) 40 MG tablet TAKE 1 TABLET(40 MG) BY MOUTH TWICE DAILY 180 tablet 2   fluticasone (FLONASE) 50 MCG/ACT nasal spray Can use one spray in each nostril once daily as directed. 48 g 1   GEMTESA 75 MG TABS Take 1 tablet  by mouth daily. 90 tablet 2   Hypromellose (ARTIFICIAL TEARS OP) Apply 1 drop to eye as needed for dry eyes.     loratadine (CLARITIN) 10 MG tablet Take 10 mg by mouth daily.     Multiple Vitamins-Minerals (MULTIVITAMIN PO) Take 1 tablet by mouth daily.     tamsulosin (FLOMAX) 0.4 MG CAPS capsule Take 0.4 mg by mouth daily.     VITAMIN E PO Take 400 Units by mouth 2 (two) times daily.     No facility-administered medications prior to visit.    Allergies  Allergen Reactions   Tetanus Toxoids Swelling and Other (See Comments)    Swelling  around throat and jaws   Bee Venom Swelling and Other (See Comments)    Crusty area on skin   Hornet Venom Swelling    Crusty area on skin   Dilaudid [Hydromorphone] Nausea And Vomiting    Causes nausea and vomiting   Erythromycin Nausea And Vomiting   Fiorinal [Butalbital-Aspirin-Caffeine]     Altered mental status   Amoxicillin Other (See Comments)    dizzy   Doxycycline Other (See Comments)    dizzy   Latex Other (See Comments)    Skin will crack open and bleed   Neomycin Other (See Comments)    Irritates skin   Penicillins Itching, Swelling and Rash    Review of Systems  Constitutional:  Negative for appetite change, fatigue and fever.  HENT:  Negative for congestion, ear pain, sinus pressure and sore throat.   Eyes:  Negative for visual disturbance.  Respiratory:  Negative for cough, chest tightness, shortness of breath and wheezing.   Cardiovascular:  Negative for chest pain and palpitations.  Gastrointestinal:  Negative for abdominal pain, constipation, diarrhea, nausea and vomiting.  Genitourinary:  Negative for dysuria and hematuria.  Musculoskeletal:  Negative for arthralgias, back pain, joint swelling and myalgias.  Skin:  Negative for rash.  Neurological:  Negative for dizziness, weakness and headaches.  Psychiatric/Behavioral:  Negative for dysphoric mood. The patient is nervous/anxious.        Objective:    Physical  Exam Vitals reviewed.  Constitutional:      Appearance: Normal appearance. She is normal weight.  HENT:     Head: Normocephalic and atraumatic.     Right Ear: Tympanic membrane normal.     Left Ear: Tympanic membrane normal.     Nose: Nose normal.     Mouth/Throat:     Mouth: Mucous membranes are moist.     Pharynx: Oropharynx is clear.  Eyes:     Extraocular Movements: Extraocular movements intact.     Conjunctiva/sclera: Conjunctivae normal.     Pupils: Pupils are equal, round, and reactive to light.  Cardiovascular:     Rate and Rhythm: Normal rate and regular rhythm.     Heart sounds: Normal heart sounds.  Pulmonary:     Effort: Pulmonary effort is normal. No respiratory distress.     Breath sounds: Normal breath sounds. No wheezing.  Abdominal:     General: Abdomen is flat. Bowel sounds are normal. There is no distension.     Palpations: Abdomen is soft.     Tenderness: There is no abdominal tenderness.  Genitourinary:    Comments: Fecal soilage Musculoskeletal:        General: Normal range of motion.     Right lower leg: No edema.     Left lower leg: No edema.  Skin:    General: Skin is warm and dry.     Capillary Refill: Capillary refill takes less than 2 seconds.  Neurological:     General: No focal deficit present.     Mental Status: She is alert and oriented to person, place, and time. Mental status is at baseline.     Gait: Gait normal.  Psychiatric:        Mood and Affect: Mood normal.        Behavior: Behavior normal.     Comments: anxious     BP 128/68 (BP Location: Left Arm, Patient Position: Sitting)   Pulse 78   Temp 97.8 F (36.6 C) (Temporal)   Ht 4' 11.6" (1.514 m)  Wt 142 lb (64.4 kg)   SpO2 98%   BMI 28.11 kg/m  Wt Readings from Last 3 Encounters:  05/16/22 142 lb (64.4 kg)  03/28/22 141 lb (64 kg)  03/22/22 141 lb (64 kg)    Health Maintenance Due  Topic Date Due   Zoster Vaccines- Shingrix (2 of 2) 02/24/2022   COVID-19 Vaccine  (6 - 2023-24 season) 05/18/2022    There are no preventive care reminders to display for this patient.   Lab Results  Component Value Date   TSH 1.290 04/08/2021   Lab Results  Component Value Date   WBC 5.0 03/21/2022   HGB 11.8 03/21/2022   HCT 36.1 03/21/2022   MCV 92 03/21/2022   PLT 255 03/21/2022   Lab Results  Component Value Date   NA 138 03/21/2022   K 3.9 03/21/2022   CO2 21 03/21/2022   GLUCOSE 97 03/21/2022   BUN 19 03/21/2022   CREATININE 1.09 (H) 03/21/2022   BILITOT 0.4 03/21/2022   ALKPHOS 78 03/21/2022   AST 19 03/21/2022   ALT 14 03/21/2022   PROT 6.3 03/21/2022   ALBUMIN 4.5 03/21/2022   CALCIUM 10.0 03/21/2022   ANIONGAP 7 01/12/2015   EGFR 51 (L) 03/21/2022   Lab Results  Component Value Date   CHOL 160 03/21/2022   Lab Results  Component Value Date   HDL 64 03/21/2022   Lab Results  Component Value Date   LDLCALC 79 03/21/2022   Lab Results  Component Value Date   TRIG 90 03/21/2022   Lab Results  Component Value Date   CHOLHDL 2.5 03/21/2022   No results found for: "HGBA1C"       Assessment & Plan:   Anxiety - Plan: ALPRAZolam (XANAX) 0.25 MG tablet Patient is becoming increasingly anxious sleeping only 4 hours at night she has had at least 3 attempts breaking into her apartment and has only 1 block.  Landlord will not instal deadbolt.  She brought something up from the door at this point.  Since his apartment on February supported, I recommended she talk to protective service or at least send a letter to her Birder Robson so this can be looked into for safety of the geriatric tenets.  Fecal smearing - Plan: Ambulatory referral to Gastroenterology, patient has seen GI without any help may need to refer to the general surgeon .  A total of 30 minutes were spent face-to-face with the patient during this encounter and over half of that time was spent on counseling and coordination of care.       I,Lauren M Auman,acting as a  scribe for Reinaldo Meeker, MD.,have documented all relevant documentation on the behalf of Reinaldo Meeker, MD,as directed by  Reinaldo Meeker, MD while in the presence of Reinaldo Meeker, MD.   Reinaldo Meeker, MD

## 2022-05-24 ENCOUNTER — Encounter: Payer: Self-pay | Admitting: Legal Medicine

## 2022-05-24 ENCOUNTER — Ambulatory Visit (INDEPENDENT_AMBULATORY_CARE_PROVIDER_SITE_OTHER): Payer: Medicare HMO | Admitting: Legal Medicine

## 2022-05-24 VITALS — BP 144/86 | HR 53 | Temp 97.7°F | Ht 59.6 in | Wt 145.0 lb

## 2022-05-24 DIAGNOSIS — K648 Other hemorrhoids: Secondary | ICD-10-CM

## 2022-05-24 HISTORY — DX: Other hemorrhoids: K64.8

## 2022-05-24 MED ORDER — HYDROCORTISONE (PERIANAL) 2.5 % EX CREA: 1.0000 | TOPICAL_CREAM | Freq: Two times a day (BID) | CUTANEOUS | 0 refills | Status: DC

## 2022-05-24 NOTE — Progress Notes (Signed)
Subjective:  Patient ID: Shannon Martin, female    DOB: 24-Oct-1940  Age: 81 y.o. MRN: [id number]  Chief Complaint  Patient presents with   Blood in stool    Follow up    HPI   Patient is having fecal soilage constantly and came in over a week ago and was referred to Dr. Jennye BoroughsMisenheimer who can't get her into his office until January and patient is concerned that is too far out for her symptoms.She has BRBPR for 3 days, then it stopped yesterday, Dr. Braulio ConteMeisenheimer unable to see her until January. No abdominal pain, no diarrhea.    Current Outpatient Medications on File Prior to Visit  Medication Sig Dispense Refill   albuterol (VENTOLIN HFA) 108 (90 Base) MCG/ACT inhaler Inhale two puffs every four to six hours as needed for cough or wheeze. 8 g 3   ALPRAZolam (XANAX) 0.25 MG tablet Take 1 tablet (0.25 mg total) by mouth 2 (two) times daily as needed for anxiety. 20 tablet 3   apixaban (ELIQUIS) 5 MG TABS tablet Take 1 tablet (5 mg total) by mouth 2 (two) times daily. 180 tablet 1   aspirin EC 81 MG tablet Take 81 mg by mouth daily.     atorvastatin (LIPITOR) 40 MG tablet Take 1 tablet (40 mg total) by mouth daily. 90 tablet 3   budesonide-formoterol (SYMBICORT) 80-4.5 MCG/ACT inhaler INHALE 2 PUFFS INTO LUNGS TWICE DAILY, IN THE MORNING AND EVENING 30.6 g 6   Calcium Carb-Cholecalciferol (CALCIUM + D3) 600-200 MG-UNIT TABS Take 1 tablet by mouth 2 (two) times daily.     celecoxib (CELEBREX) 100 MG capsule TAKE 1 CAPSULE(100 MG) BY MOUTH TWICE DAILY 180 capsule 2   Cholecalciferol (D3-1000) 25 MCG (1000 UT) capsule Take 1,000 Units by mouth daily.     dicyclomine (BENTYL) 20 MG tablet TAKE ONE TABLET BY MOUTH FOUR TIMES DAILY BEFORE MEALS AND AT BEDTIME 60 tablet 1   estradiol (ESTRACE) 0.1 MG/GM vaginal cream Place 1 Applicatorful vaginally at bedtime.     famotidine (PEPCID) 40 MG tablet TAKE 1 TABLET(40 MG) BY MOUTH TWICE DAILY 180 tablet 2   fluticasone (FLONASE) 50 MCG/ACT nasal spray  Can use one spray in each nostril once daily as directed. 48 g 1   GEMTESA 75 MG TABS Take 1 tablet by mouth daily. 90 tablet 2   Hypromellose (ARTIFICIAL TEARS OP) Apply 1 drop to eye as needed for dry eyes.     loratadine (CLARITIN) 10 MG tablet Take 10 mg by mouth daily.     Multiple Vitamins-Minerals (MULTIVITAMIN PO) Take 1 tablet by mouth daily.     tamsulosin (FLOMAX) 0.4 MG CAPS capsule Take 0.4 mg by mouth daily.     VITAMIN E PO Take 400 Units by mouth 2 (two) times daily.     No current facility-administered medications on file prior to visit.   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   Laryngopharyngeal reflux (LPR) 03/24/2015   Left knee pain 10/01/2014   Memory changes 09/16/2021   Migraine without aura and without status migrainosus, not intractable 07/28/2019   Migraines 1943 to present   "fairly frequently"; Takes Propranolol (  01/12/2015)   Mixed hyperlipidemia 12/11/2018   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   Overactive bladder    Takes Vesicare   PAF (paroxysmal atrial fibrillation) ([facility]) 05/04/2021   Palpitations 04/08/2021   Primary osteoarthritis of left knee 12/03/2014   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   Seborrheic dermatitis 12/21/2020   Syncope and collapse 12/11/2018   Past Surgical History:  Procedure Laterality Date   BREAST BIOPSY Left ~ 1973   CATARACT EXTRACTION W/ INTRAOCULAR LENS  IMPLANT, BILATERAL Bilateral 2003   CHEST TUBE INSERTION  January 2015   X 2 at [facility]   DILATION AND CURETTAGE OF UTERUS  4-5   JOINT REPLACEMENT     REPLACEMENT TOTAL KNEE Left    TONSILLECTOMY AND  ADENOIDECTOMY  1946; 1947   TOTAL KNEE ARTHROPLASTY Left 01/11/2015   TOTAL KNEE ARTHROPLASTY Left 01/11/2015   Procedure: Left TOTAL KNEE ARTHROPLASTY;  Surgeon: Dannielle Huh, MD;  Location: [facility];  Service: Orthopedics;  Laterality: Left;   VIDEO ASSISTED THORACOSCOPY (VATS)/THOROCOTOMY Left 07/13/2013   VATS with insertion of chest tubes    Family History  Problem Relation Age of Onset   Hypertension Mother    Diabetes Father    Hypertension Maternal Grandmother    Hypertension Maternal Grandfather    Social History   Socioeconomic History   Marital status: Widowed    Spouse name: Not on file   Number of children: 1   Years of education: Not on file   Highest education level: Not on file  Occupational History   Not on file  Tobacco Use   Smoking status: Never   Smokeless tobacco: Never  Vaping Use   Vaping Use: Never used  Substance and Sexual Activity   Alcohol use: Yes    Alcohol/week: 2.0 standard drinks of alcohol    Types: 2 Glasses of wine per week   Drug use: No   Sexual activity: Not Currently  Other Topics Concern   Not on file  Social History Narrative   Daughter lives nearby, granddaughter lives in [place] - married 3 times, all deceased.  Has not been in contact with her sister in >20 years   Social Determinants of Health   Financial Resource Strain: Low Risk  (03/14/2022)   Overall Financial Resource Strain (CARDIA)    Difficulty of Paying Living Expenses: Not hard at all  Food Insecurity: No Food Insecurity (10/11/2021)   Hunger Vital Sign    Worried About Running Out of Food in the Last Year: Never true    Ran Out of Food in the Last Year: Never true  Transportation Needs: No Transportation Needs (03/14/2022)   PRAPARE - Administrator, Civil Service (Medical): No    Lack of Transportation (Non-Medical): No  Physical Activity: Insufficiently Active (10/11/2021)   Exercise Vital Sign    Days of Exercise per Week: 2 days    Minutes of  Exercise per Session: 10 min  Stress: No Stress Concern Present (10/11/2021)   [employer] of Occupational Health - Occupational Stress Questionnaire    Feeling of Stress : Not at all  Social Connections: Unknown (10/11/2021)   Social Connection and Isolation Panel [NHANES]    Frequency of Communication with Friends and Family: More than three times a week    Frequency of Social Gatherings with Friends and Family: More than three times a week    Attends Religious Services: Not on file    Active Member of Clubs or Organizations: No  Attends Banker Meetings: Never    Marital Status: Widowed    Review of Systems  Constitutional:  Negative for appetite change, fatigue and fever.  HENT:  Negative for congestion, ear pain, sinus pressure and sore throat.   Eyes:  Negative for visual disturbance.  Respiratory:  Negative for cough, chest tightness, shortness of breath and wheezing.   Cardiovascular:  Negative for chest pain and palpitations.  Gastrointestinal:  Positive for blood in stool. Negative for abdominal pain, constipation, diarrhea, nausea and vomiting.  Endocrine: Negative.   Genitourinary:  Negative for dysuria and hematuria.  Musculoskeletal:  Negative for arthralgias, back pain, joint swelling and myalgias.  Skin:  Negative for rash.  Neurological:  Negative for dizziness, weakness and headaches.  Psychiatric/Behavioral:  Negative for dysphoric mood. The patient is not nervous/anxious.      Objective:  BP (!) 144/86 (BP Location: Left Arm, Patient Position: Sitting)   Pulse (!) 53   Temp 97.7 F (36.5 C) (Temporal)   Ht 4' 11.6" (1.514 m)   Wt 145 lb (65.8 kg)   SpO2 98%   BMI 28.70 kg/m      05/24/2022   11:33 AM 05/16/2022    9:06 AM 03/28/2022    8:47 AM  BP/Weight  Systolic BP 144 128 135  Diastolic BP 86 68 74  Wt. (Lbs) 145 142 141  BMI 28.7 kg/m2 28.11 kg/m2 27.91 kg/m2    Physical Exam Vitals reviewed.  Constitutional:       Appearance: Normal appearance. She is normal weight.  HENT:     Head: Normocephalic.     Right Ear: Tympanic membrane normal.     Left Ear: Tympanic membrane normal.     Nose: Nose normal.     Mouth/Throat:     Mouth: Mucous membranes are moist.     Pharynx: Oropharynx is clear.  Eyes:     Extraocular Movements: Extraocular movements intact.     Conjunctiva/sclera: Conjunctivae normal.     Pupils: Pupils are equal, round, and reactive to light.  Cardiovascular:     Rate and Rhythm: Normal rate and regular rhythm.     Pulses: Normal pulses.     Heart sounds: Normal heart sounds. No murmur heard.    No gallop.  Pulmonary:     Effort: Pulmonary effort is normal. No respiratory distress.     Breath sounds: Normal breath sounds. No wheezing.  Abdominal:     General: Abdomen is flat. Bowel sounds are normal. There is no distension.     Palpations: Abdomen is soft.     Tenderness: There is no abdominal tenderness.  Musculoskeletal:        General: Normal range of motion.     Cervical back: Normal range of motion and neck supple.     Right lower leg: No edema.     Left lower leg: No edema.  Skin:    General: Skin is warm.     Capillary Refill: Capillary refill takes less than 2 seconds.  Neurological:     General: No focal deficit present.     Mental Status: She is alert and oriented to person, place, and time. Mental status is at baseline.     Gait: Gait normal.  Psychiatric:        Mood and Affect: Mood normal.        Behavior: Behavior normal.     Diabetic Foot Exam - Simple   No data filed      Lab  Results  Component Value Date   WBC 5.0 03/21/2022   HGB 11.8 03/21/2022   HCT 36.1 03/21/2022   PLT 255 03/21/2022   GLUCOSE 97 03/21/2022   CHOL 160 03/21/2022   TRIG 90 03/21/2022   HDL 64 03/21/2022   LDLCALC 79 03/21/2022   ALT 14 03/21/2022   AST 19 03/21/2022   NA 138 03/21/2022   K 3.9 03/21/2022   CL 102 03/21/2022   CREATININE 1.09 (H) 03/21/2022   BUN  19 03/21/2022   CO2 21 03/21/2022   TSH 1.290 04/08/2021   INR 1.02 01/01/2015      Assessment & Plan:   Problem List Items Addressed This Visit       Cardiovascular and Mediastinum   Internal hemorrhoid, bleeding - Primary   Relevant Medications   hydrocortisone (ANUSOL-HC) 2.5 % rectal cream Hemocult negative, irritated internal hemorrhoids, treat with proctocream  .  Meds ordered this encounter  Medications   hydrocortisone (ANUSOL-HC) 2.5 % rectal cream    Sig: Place 1 Application rectally 2 (two) times daily.    Dispense:  30 g    Refill:  0    No orders of the defined types were placed in this encounter.    Follow-up: Return if symptoms worsen or fail to improve.  An After Visit Summary was printed and given to the patient.   I,Lauren M Auman,acting as a scribe for Brent Bulla, MD.,have documented all relevant documentation on the behalf of Brent Bulla, MD,as directed by  Brent Bulla, MD while in the presence of Brent Bulla, MD.    Brent Bulla, MD [facility] [phone]

## 2022-06-05 ENCOUNTER — Telehealth: Payer: Self-pay

## 2022-06-05 NOTE — Progress Notes (Addendum)
Chronic Care Management Pharmacy Assistant   Name: Shannon Martin  MRN: [id number] DOB: 1940/10/07   Reason for Encounter: Medication Coordination for Upstream    Recent office visits:  05/24/22 Brent Bulla MD. Seen for Internal Hemorrhoids. Started on Hydrocortisone 2.5%.   05/16/22 Brent Bulla MD. Seen for Anxiety. Referral to Gastroenterology. Started on Alprazolam 0.25mg .   03/28/22 Brent Bulla MD. Seen for routine visit. No med changes.  03/22/22 Brent Bulla MD. Seen for IBS. D/C [facility].  Recent consult visits:  None  Hospital visits:  None  Medications: Outpatient Encounter Medications as of 06/05/2022  Medication Sig   albuterol (VENTOLIN HFA) 108 (90 Base) MCG/ACT inhaler Inhale two puffs every four to six hours as needed for cough or wheeze.   ALPRAZolam (XANAX) 0.25 MG tablet Take 1 tablet (0.25 mg total) by mouth 2 (two) times daily as needed for anxiety.   apixaban (ELIQUIS) 5 MG TABS tablet Take 1 tablet (5 mg total) by mouth 2 (two) times daily.   aspirin EC 81 MG tablet Take 81 mg by mouth daily.   atorvastatin (LIPITOR) 40 MG tablet Take 1 tablet (40 mg total) by mouth daily.   budesonide-formoterol (SYMBICORT) 80-4.5 MCG/ACT inhaler INHALE 2 PUFFS INTO LUNGS TWICE DAILY, IN THE MORNING AND EVENING   Calcium Carb-Cholecalciferol (CALCIUM + D3) 600-200 MG-UNIT TABS Take 1 tablet by mouth 2 (two) times daily.   celecoxib (CELEBREX) 100 MG capsule TAKE 1 CAPSULE(100 MG) BY MOUTH TWICE DAILY   Cholecalciferol (D3-1000) 25 MCG (1000 UT) capsule Take 1,000 Units by mouth daily.   dicyclomine (BENTYL) 20 MG tablet TAKE ONE TABLET BY MOUTH FOUR TIMES DAILY BEFORE MEALS AND AT BEDTIME   estradiol (ESTRACE) 0.1 MG/GM vaginal cream Place 1 Applicatorful vaginally at bedtime.   famotidine (PEPCID) 40 MG tablet TAKE 1 TABLET(40 MG) BY MOUTH TWICE DAILY   fluticasone (FLONASE) 50 MCG/ACT nasal spray Can use one spray in each nostril once daily as directed.    GEMTESA 75 MG TABS Take 1 tablet by mouth daily.   hydrocortisone (ANUSOL-HC) 2.5 % rectal cream Place 1 Application rectally 2 (two) times daily.   Hypromellose (ARTIFICIAL TEARS OP) Apply 1 drop to eye as needed for dry eyes.   loratadine (CLARITIN) 10 MG tablet Take 10 mg by mouth daily.   Multiple Vitamins-Minerals (MULTIVITAMIN PO) Take 1 tablet by mouth daily.   tamsulosin (FLOMAX) 0.4 MG CAPS capsule Take 0.4 mg by mouth daily.   VITAMIN E PO Take 400 Units by mouth 2 (two) times daily.   No facility-administered encounter medications on file as of 06/05/2022.    Reviewed chart for medication changes ahead of medication coordination call.  No  Consults, or hospital visits since last care coordination call/Pharmacist visit.   BP Readings from Last 3 Encounters:  05/24/22 (!) 144/86  05/16/22 128/68  03/28/22 135/74    No results found for: "HGBA1C"   Patient obtains medications through Vials  30 Days  Pt wanted to changes to vials from packaging because she likes separating her own medications  Patient is due for first adherence delivery on: 06/15/22. Called patient and reviewed medications and coordinated delivery.  This delivery to include:  Atorvastatin 40mg  1 daily  Famotidine 40mg  twice daily  ASA 81mg  1 daily  Womens MVI 1 daily  Vitamin E 1 daily  Vitamin D 1 daily  Calcium-D 1 daily  Dicyclomine 20mg -1 tab 4x daily   Budesonide-formoterol 80-4. - INHALE 2 PUFFS INTO LUNGS TWICE DAILY,  IN THE MORNING AND EVENING  Fluticasone 56mcg-Can use one spray in each nostril once daily   Patient declined the following medications  Albuterol Inhaler 108 mcg- Pt only uses prn, does not need LF 12/30/21 16ds Alprazolam 0.25mg - Pt uses only prn, does not need LF 05/16/22 10ds Estradiol 0.1mg - Only uses prn, still has enough supply Eliquis 5mg - Pt still has a supply from another pharmacy and has not even opened the bottle we sent on 04/12/22, can wait until January  2024 Tamsulosin 0.4mg - Pt still has another 1.5 of supply, can wait until January 2024. Gemtesa 75mg -Pt has enough supply until January 2024 Celecoxib 100mg - Pt still has 150 pills left, can wait until January 2024  Note: Talked to pt in detail about all medications since this was her first onboard and verified everything   Patient needs refills  None  Confirmed delivery date of 06/15/22, advised patient that pharmacy will contact them the morning of delivery.   , CMA Clinical Pharmacist Assistant  [phone]

## 2022-06-07 NOTE — Telephone Encounter (Signed)
Compliant on meds 

## 2022-06-14 ENCOUNTER — Ambulatory Visit (INDEPENDENT_AMBULATORY_CARE_PROVIDER_SITE_OTHER): Payer: Medicare HMO | Admitting: Legal Medicine

## 2022-06-14 ENCOUNTER — Encounter: Payer: Self-pay | Admitting: Legal Medicine

## 2022-06-14 VITALS — BP 140/78 | HR 85 | Temp 98.7°F | Resp 14 | Ht 59.6 in | Wt 147.0 lb

## 2022-06-14 DIAGNOSIS — K648 Other hemorrhoids: Secondary | ICD-10-CM

## 2022-06-14 DIAGNOSIS — F419 Anxiety disorder, unspecified: Secondary | ICD-10-CM

## 2022-06-14 NOTE — Progress Notes (Unsigned)
Subjective:  Patient ID: Shannon Martin, female    DOB: [birth year]  Age: 81 y.o. MRN: [id number]  Chief Complaint  Patient presents with   Anxiety   Genital Warts    HPI  Patient started Xanax for Anxiety. She mentioned that she has not using it because her anxiety is getting better.  She also was seen for hemorrhoids few weeks ago and they get better.  Current Outpatient Medications on File Prior to Visit  Medication Sig Dispense Refill   albuterol (VENTOLIN HFA) 108 (90 Base) MCG/ACT inhaler Inhale two puffs every four to six hours as needed for cough or wheeze. 8 g 3   ALPRAZolam (XANAX) 0.25 MG tablet Take 1 tablet (0.25 mg total) by mouth 2 (two) times daily as needed for anxiety. 20 tablet 3   apixaban (ELIQUIS) 5 MG TABS tablet Take 1 tablet (5 mg total) by mouth 2 (two) times daily. 180 tablet 1   ascorbic acid (VITAMIN C) 1000 MG tablet Take 1,000 mg by mouth daily.     aspirin EC 81 MG tablet Take 81 mg by mouth daily.     atorvastatin (LIPITOR) 40 MG tablet Take 1 tablet (40 mg total) by mouth daily. 90 tablet 3   B Complex-C-Calcium (B-COMPLEX/VITAMIN C, W/ CA, PO) Take by mouth.     budesonide-formoterol (SYMBICORT) 80-4.5 MCG/ACT inhaler INHALE 2 PUFFS INTO LUNGS TWICE DAILY, IN THE MORNING AND EVENING 30.6 g 6   Calcium Carb-Cholecalciferol (CALCIUM + D3) 600-200 MG-UNIT TABS Take 1 tablet by mouth 2 (two) times daily.     celecoxib (CELEBREX) 100 MG capsule TAKE 1 CAPSULE(100 MG) BY MOUTH TWICE DAILY 180 capsule 2   Cholecalciferol (D3-1000) 25 MCG (1000 UT) capsule Take 1,000 Units by mouth daily.     dicyclomine (BENTYL) 20 MG tablet TAKE ONE TABLET BY MOUTH FOUR TIMES DAILY BEFORE MEALS AND AT BEDTIME 60 tablet 1   estradiol (ESTRACE) 0.1 MG/GM vaginal cream Place 1 Applicatorful vaginally at bedtime.     famotidine (PEPCID) 40 MG tablet TAKE 1 TABLET(40 MG) BY MOUTH TWICE DAILY 180 tablet 2   fluticasone (FLONASE) 50 MCG/ACT nasal spray Can use one spray in each  nostril once daily as directed. 48 g 1   GEMTESA 75 MG TABS Take 1 tablet by mouth daily. 90 tablet 2   hydrocortisone (ANUSOL-HC) 2.5 % rectal cream Place 1 Application rectally 2 (two) times daily. 30 g 0   Hypromellose (ARTIFICIAL TEARS OP) Apply 1 drop to eye as needed for dry eyes.     loratadine (CLARITIN) 10 MG tablet Take 10 mg by mouth daily.     Multiple Vitamins-Minerals (HAIR SKIN & NAILS PO) Take by mouth.     Multiple Vitamins-Minerals (MULTIVITAMIN PO) Take 1 tablet by mouth daily.     tamsulosin (FLOMAX) 0.4 MG CAPS capsule Take 0.4 mg by mouth daily.     VITAMIN E PO Take 400 Units by mouth 2 (two) times daily.     No current facility-administered medications on file prior to visit.   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   Laryngopharyngeal reflux (LPR) 03/24/2015   Left knee pain 10/01/2014  Memory changes 09/16/2021   Migraine without aura and without status migrainosus, not intractable 07/28/2019   Migraines 1943 to present   "fairly frequently"; Takes Propranolol (01/12/2015)   Mixed hyperlipidemia 12/11/2018   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   Overactive bladder    Takes Vesicare   PAF (paroxysmal atrial fibrillation) (HCC) 05/04/2021   Palpitations 04/08/2021   Primary osteoarthritis of left knee 12/03/2014   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   Seborrheic dermatitis 12/21/2020   Syncope and collapse 12/11/2018   Past Surgical History:  Procedure Laterality Date   BREAST BIOPSY Left ~ 1973   CATARACT EXTRACTION W/ INTRAOCULAR LENS  IMPLANT, BILATERAL Bilateral 2003   CHEST TUBE INSERTION  January 2015    [facility]   DILATION AND CURETTAGE OF UTERUS  4-5   JOINT REPLACEMENT     REPLACEMENT TOTAL KNEE Left    TONSILLECTOMY AND ADENOIDECTOMY  1946; 1947   TOTAL KNEE ARTHROPLASTY Left 01/11/2015   TOTAL KNEE ARTHROPLASTY Left 01/11/2015   Procedure: Left TOTAL KNEE ARTHROPLASTY;  Surgeon: Dannielle HuhSteve Lucey, MD;  Location: [facility] OR;  Service: Orthopedics;  Laterality: Left;   VIDEO ASSISTED THORACOSCOPY (VATS)/THOROCOTOMY Left 07/13/2013   VATS with insertion of chest tubes    Family History  Problem Relation Age of Onset   Hypertension Mother    Diabetes Father    Hypertension Maternal Grandmother    Hypertension Maternal Grandfather    Social History   Socioeconomic History   Marital status: Widowed    Spouse name: Not on file   Number of children: 1   Years of education: Not on file   Highest education level: Not on file  Occupational History   Not on file  Tobacco Use   Smoking status: Never   Smokeless tobacco: Never  Vaping Use   Vaping Use: Never used  Substance and Sexual Activity   Alcohol use: Yes    Alcohol/week: 2.0 standard drinks of alcohol    Types: 2 Glasses of wine per week   Drug use: No   Sexual activity: Not Currently  Other Topics Concern   Not on file  Social History Narrative   Daughter lives nearby, granddaughter [place] in [place] - married 3 times, all deceased.  Has not been in contact with her sister in >20 years   Social Determinants of Health   Financial Resource Strain: Low Risk  (03/14/2022)   Overall Financial Resource Strain (CARDIA)    Difficulty of Paying Living Expenses: Not hard at all  Food Insecurity: No Food Insecurity (10/11/2021)   Hunger Vital Sign    Worried About Running Out of Food in the Last Year: Never true    Ran Out of Food in the Last Year: Never true  Transportation Needs: No Transportation Needs (03/14/2022)   PRAPARE - Administrator, Civil ServiceTransportation    Lack of Transportation (Medical): No    Lack of Transportation  (Non-Medical): No  Physical Activity: Insufficiently Active (10/11/2021)   Exercise Vital Sign    Days of Exercise per Week: 2 days    Minutes of Exercise per Session: 10 min  Stress: No Stress Concern Present (10/11/2021)   [employer] - Occupational Stress Questionnaire    Feeling of Stress : Not at all  Social Connections: Unknown (10/11/2021)   Social Connection and Isolation Panel [NHANES]    Frequency of Communication with Friends and Family: More than three times a week    Frequency of Social Gatherings with Friends and Family:  More than three times a week    Attends Religious Services: Not on file    Active Member of Clubs or Organizations: No    Attends Club or Organization Meetings: Never    Marital Status: Widowed    Review of Systems  Constitutional:  Negative for chills, fatigue and fever.  HENT:  Negative for congestion, ear pain and sore throat.   Respiratory:  Negative for cough and shortness of breath.   Cardiovascular:  Negative for chest pain and palpitations.  Gastrointestinal:  Negative for abdominal pain, constipation, diarrhea, nausea and vomiting.  Endocrine: Negative for polydipsia, polyphagia and polyuria.  Genitourinary:  Negative for difficulty urinating and dysuria.  Musculoskeletal:  Negative for arthralgias, back pain and myalgias.  Skin:  Negative for rash.  Neurological:  Negative for headaches.  Psychiatric/Behavioral:  Negative for dysphoric mood. The patient is not nervous/anxious.      Objective:  BP (!) 160/80   Pulse 85   Temp 98.7 F (37.1 C)   Resp 14   Ht 4' 11.6" (1.514 m)   Wt 147 lb (66.7 kg)   SpO2 99%   BMI 29.10 kg/m      06/14/2022   11:07 AM 05/24/2022   11:33 AM 05/16/2022    9:06 AM  BP/Weight  Systolic BP 160 144 128  Diastolic BP 80 86 68  Wt. (Lbs) 147 145 142  BMI 29.1 kg/m2 28.7 kg/m2 28.11 kg/m2    Physical Exam Vitals reviewed.  Constitutional:      General: She is not in  acute distress.    Appearance: Normal appearance.  HENT:     Head: Normocephalic.     Right Ear: Tympanic membrane normal.     Left Ear: Tympanic membrane normal.     Mouth/Throat:     Mouth: Mucous membranes are moist.     Pharynx: Oropharynx is clear.  Eyes:     Extraocular Movements: Extraocular movements intact.     Conjunctiva/sclera: Conjunctivae normal.     Pupils: Pupils are equal, round, and reactive to light.  Cardiovascular:     Rate and Rhythm: Normal rate and regular rhythm.     Pulses: Normal pulses.     Heart sounds: Normal heart sounds. No murmur heard.    No gallop.  Pulmonary:     Effort: Pulmonary effort is normal. No respiratory distress.     Breath sounds: Normal breath sounds. No wheezing.  Abdominal:     General: Abdomen is flat. Bowel sounds are normal. There is no distension.     Palpations: Abdomen is soft.     Tenderness: There is no abdominal tenderness.  Musculoskeletal:        General: Normal range of motion.     Cervical back: Normal range of motion.     Right lower leg: No edema.     Left lower leg: No edema.  Skin:    General: Skin is warm.     Capillary Refill: Capillary refill takes less than 2 seconds.  Neurological:     General: No focal deficit present.     Mental Status: She is alert and oriented to person, place, and time. Mental status is at baseline.     Gait: Gait normal.         Lab Results  Component Value Date   WBC 5.0 03/21/2022   HGB 11.8 03/21/2022   HCT 36.1 03/21/2022   PLT 255 03/21/2022   GLUCOSE 97 03/21/2022   CHOL 160 03/21/2022  TRIG 90 03/21/2022   HDL 64 03/21/2022   LDLCALC 79 03/21/2022   ALT 14 03/21/2022   AST 19 03/21/2022   NA 138 03/21/2022   K 3.9 03/21/2022   CL 102 03/21/2022   CREATININE 1.09 (H) 03/21/2022   BUN 19 03/21/2022   CO2 21 03/21/2022   TSH 1.290 04/08/2021   INR 1.02 01/01/2015      Assessment & Plan:   Problem List Items Addressed This Visit        Cardiovascular and Mediastinum   Internal hemorrhoid, bleeding     Other   Anxiety - Primary         Follow-up: Return in about 3 months (around 09/13/2022).  An After Visit Summary was printed and given to the patient.  Brent Bulla, MD [facility] [phone]

## 2022-06-30 ENCOUNTER — Inpatient Hospital Stay (HOSPITAL_COMMUNITY): Payer: Medicare HMO | Admitting: Anesthesiology

## 2022-06-30 ENCOUNTER — Inpatient Hospital Stay (HOSPITAL_COMMUNITY): Payer: Medicare HMO

## 2022-06-30 ENCOUNTER — Emergency Department (HOSPITAL_COMMUNITY): Payer: Medicare HMO

## 2022-06-30 ENCOUNTER — Other Ambulatory Visit: Payer: Self-pay

## 2022-06-30 ENCOUNTER — Inpatient Hospital Stay (HOSPITAL_COMMUNITY)
Admission: EM | Admit: 2022-06-30 | Discharge: 2022-07-03 | DRG: 522 | Disposition: A | Discharge status: Skilled Nursing Facility | Payer: Medicare HMO | Attending: Internal Medicine | Admitting: Internal Medicine

## 2022-06-30 ENCOUNTER — Encounter (HOSPITAL_COMMUNITY): Payer: Self-pay | Admitting: Internal Medicine

## 2022-06-30 DIAGNOSIS — Z96652 Presence of left artificial knee joint: Secondary | ICD-10-CM | POA: Diagnosis present

## 2022-06-30 DIAGNOSIS — N3281 Overactive bladder: Secondary | ICD-10-CM | POA: Diagnosis present

## 2022-06-30 DIAGNOSIS — Z888 Allergy status to other drugs, medicaments and biological substances status: Secondary | ICD-10-CM | POA: Diagnosis not present

## 2022-06-30 DIAGNOSIS — Z961 Presence of intraocular lens: Secondary | ICD-10-CM | POA: Diagnosis present

## 2022-06-30 DIAGNOSIS — Z96642 Presence of left artificial hip joint: Secondary | ICD-10-CM | POA: Diagnosis not present

## 2022-06-30 DIAGNOSIS — M1990 Unspecified osteoarthritis, unspecified site: Secondary | ICD-10-CM | POA: Diagnosis not present

## 2022-06-30 DIAGNOSIS — Z7982 Long term (current) use of aspirin: Secondary | ICD-10-CM | POA: Diagnosis not present

## 2022-06-30 DIAGNOSIS — M159 Polyosteoarthritis, unspecified: Secondary | ICD-10-CM | POA: Diagnosis present

## 2022-06-30 DIAGNOSIS — R339 Retention of urine, unspecified: Secondary | ICD-10-CM | POA: Diagnosis not present

## 2022-06-30 DIAGNOSIS — E782 Mixed hyperlipidemia: Secondary | ICD-10-CM | POA: Diagnosis not present

## 2022-06-30 DIAGNOSIS — Z880 Allergy status to penicillin: Secondary | ICD-10-CM

## 2022-06-30 DIAGNOSIS — Z833 Family history of diabetes mellitus: Secondary | ICD-10-CM

## 2022-06-30 DIAGNOSIS — S72002A Fracture of unspecified part of neck of left femur, initial encounter for closed fracture: Secondary | ICD-10-CM | POA: Diagnosis not present

## 2022-06-30 DIAGNOSIS — M2041 Other hammer toe(s) (acquired), right foot: Secondary | ICD-10-CM | POA: Diagnosis present

## 2022-06-30 DIAGNOSIS — J4540 Moderate persistent asthma, uncomplicated: Secondary | ICD-10-CM | POA: Diagnosis not present

## 2022-06-30 DIAGNOSIS — Z9842 Cataract extraction status, left eye: Secondary | ICD-10-CM

## 2022-06-30 DIAGNOSIS — I480 Paroxysmal atrial fibrillation: Secondary | ICD-10-CM | POA: Diagnosis present

## 2022-06-30 DIAGNOSIS — S72042A Displaced fracture of base of neck of left femur, initial encounter for closed fracture: Secondary | ICD-10-CM | POA: Diagnosis not present

## 2022-06-30 DIAGNOSIS — Y92002 Bathroom of unspecified non-institutional (private) residence single-family (private) house as the place of occurrence of the external cause: Secondary | ICD-10-CM

## 2022-06-30 DIAGNOSIS — I10 Essential (primary) hypertension: Secondary | ICD-10-CM | POA: Diagnosis not present

## 2022-06-30 DIAGNOSIS — R079 Chest pain, unspecified: Secondary | ICD-10-CM | POA: Diagnosis not present

## 2022-06-30 DIAGNOSIS — F419 Anxiety disorder, unspecified: Secondary | ICD-10-CM | POA: Diagnosis not present

## 2022-06-30 DIAGNOSIS — Z9841 Cataract extraction status, right eye: Secondary | ICD-10-CM

## 2022-06-30 DIAGNOSIS — Z79899 Other long term (current) drug therapy: Secondary | ICD-10-CM

## 2022-06-30 DIAGNOSIS — M19011 Primary osteoarthritis, right shoulder: Secondary | ICD-10-CM | POA: Diagnosis not present

## 2022-06-30 DIAGNOSIS — W010XXA Fall on same level from slipping, tripping and stumbling without subsequent striking against object, initial encounter: Secondary | ICD-10-CM | POA: Diagnosis present

## 2022-06-30 DIAGNOSIS — Z7901 Long term (current) use of anticoagulants: Secondary | ICD-10-CM

## 2022-06-30 DIAGNOSIS — M25552 Pain in left hip: Secondary | ICD-10-CM | POA: Diagnosis not present

## 2022-06-30 DIAGNOSIS — F411 Generalized anxiety disorder: Secondary | ICD-10-CM | POA: Diagnosis present

## 2022-06-30 DIAGNOSIS — S0990XA Unspecified injury of head, initial encounter: Secondary | ICD-10-CM | POA: Diagnosis not present

## 2022-06-30 DIAGNOSIS — Z8249 Family history of ischemic heart disease and other diseases of the circulatory system: Secondary | ICD-10-CM

## 2022-06-30 DIAGNOSIS — I4891 Unspecified atrial fibrillation: Secondary | ICD-10-CM | POA: Diagnosis not present

## 2022-06-30 DIAGNOSIS — Y9301 Activity, walking, marching and hiking: Secondary | ICD-10-CM | POA: Diagnosis present

## 2022-06-30 DIAGNOSIS — F0394 Unspecified dementia, unspecified severity, with anxiety: Secondary | ICD-10-CM | POA: Diagnosis present

## 2022-06-30 DIAGNOSIS — Z881 Allergy status to other antibiotic agents status: Secondary | ICD-10-CM

## 2022-06-30 DIAGNOSIS — S72002D Fracture of unspecified part of neck of left femur, subsequent encounter for closed fracture with routine healing: Secondary | ICD-10-CM | POA: Diagnosis not present

## 2022-06-30 DIAGNOSIS — S72009A Fracture of unspecified part of neck of unspecified femur, initial encounter for closed fracture: Secondary | ICD-10-CM | POA: Diagnosis not present

## 2022-06-30 DIAGNOSIS — Z7951 Long term (current) use of inhaled steroids: Secondary | ICD-10-CM

## 2022-06-30 DIAGNOSIS — K219 Gastro-esophageal reflux disease without esophagitis: Secondary | ICD-10-CM | POA: Diagnosis present

## 2022-06-30 DIAGNOSIS — Z91030 Bee allergy status: Secondary | ICD-10-CM

## 2022-06-30 DIAGNOSIS — Z471 Aftercare following joint replacement surgery: Secondary | ICD-10-CM | POA: Diagnosis not present

## 2022-06-30 DIAGNOSIS — J45909 Unspecified asthma, uncomplicated: Secondary | ICD-10-CM | POA: Diagnosis not present

## 2022-06-30 DIAGNOSIS — Z043 Encounter for examination and observation following other accident: Secondary | ICD-10-CM | POA: Diagnosis not present

## 2022-06-30 DIAGNOSIS — S72009D Fracture of unspecified part of neck of unspecified femur, subsequent encounter for closed fracture with routine healing: Secondary | ICD-10-CM | POA: Diagnosis not present

## 2022-06-30 DIAGNOSIS — G8911 Acute pain due to trauma: Secondary | ICD-10-CM | POA: Diagnosis not present

## 2022-06-30 HISTORY — DX: Fracture of unspecified part of neck of unspecified femur, initial encounter for closed fracture: S72.009A

## 2022-06-30 HISTORY — DX: Retention of urine, unspecified: R33.9

## 2022-06-30 LAB — COMPREHENSIVE METABOLIC PANEL
ALT: 21 U/L (ref 0–44)
AST: 30 U/L (ref 15–41)
Albumin: 3.8 g/dL (ref 3.5–5.0)
Alkaline Phosphatase: 85 U/L (ref 38–126)
Anion gap: 12 (ref 5–15)
BUN: 19 mg/dL (ref 8–23)
CO2: 22 mmol/L (ref 22–32)
Calcium: 9.5 mg/dL (ref 8.9–10.3)
Chloride: 104 mmol/L (ref 98–111)
Creatinine, Ser: 0.96 mg/dL (ref 0.44–1.00)
GFR, Estimated: 60 mL/min — ABNORMAL LOW (ref >60)
Glucose, Bld: 118 mg/dL — ABNORMAL HIGH (ref 70–99)
Potassium: 3.5 mmol/L (ref 3.5–5.1)
Sodium: 138 mmol/L (ref 135–145)
Total Bilirubin: 0.5 mg/dL (ref 0.3–1.2)
Total Protein: 6.5 g/dL (ref 6.5–8.1)

## 2022-06-30 LAB — CBC
HCT: 35.5 % — ABNORMAL LOW (ref 36.0–46.0)
Hemoglobin: 12.3 g/dL (ref 12.0–15.0)
MCH: 31.4 pg (ref 26.0–34.0)
MCHC: 34.6 g/dL (ref 30.0–36.0)
MCV: 90.6 fL (ref 80.0–100.0)
Platelets: 251 10*3/uL (ref 150–400)
RBC: 3.92 MIL/uL (ref 3.87–5.11)
RDW: 12.5 % (ref 11.5–15.5)
WBC: 11.4 10*3/uL — ABNORMAL HIGH (ref 4.0–10.5)
nRBC: 0 % (ref 0.0–0.2)

## 2022-06-30 LAB — TYPE AND SCREEN
ABO/RH(D): B POS
Antibody Screen: NEGATIVE

## 2022-06-30 LAB — ETHANOL: Alcohol, Ethyl (B): <10 mg/dL (ref <10)

## 2022-06-30 LAB — ABO/RH: ABO/RH(D): B POS

## 2022-06-30 LAB — LACTIC ACID, PLASMA: Lactic Acid, Venous: 2.6 mmol/L (ref 0.5–1.9)

## 2022-06-30 MED ORDER — OXYCODONE HCL 5 MG PO TABS: 5.0000 mg | ORAL_TABLET | ORAL | Status: DC | PRN

## 2022-06-30 MED ORDER — TAMSULOSIN HCL 0.4 MG PO CAPS
0.4000 mg | ORAL_CAPSULE | Freq: Every day | ORAL | Status: DC
  Administered 2022-06-30 – 2022-07-03 (×3): 0.4 mg via ORAL
  Filled 2022-06-30 (×3): qty 1

## 2022-06-30 MED ORDER — ASPIRIN 81 MG PO TBEC
81.0000 mg | DELAYED_RELEASE_TABLET | Freq: Every day | ORAL | Status: DC
  Administered 2022-07-02 – 2022-07-03 (×2): 81 mg via ORAL
  Filled 2022-06-30 (×2): qty 1

## 2022-06-30 MED ORDER — BISACODYL 5 MG PO TBEC: 5.0000 mg | DELAYED_RELEASE_TABLET | Freq: Every day | ORAL | Status: DC | PRN

## 2022-06-30 MED ORDER — MOMETASONE FURO-FORMOTEROL FUM 100-5 MCG/ACT IN AERO
2.0000 | INHALATION_SPRAY | Freq: Two times a day (BID) | RESPIRATORY_TRACT | Status: DC
  Administered 2022-07-01 – 2022-07-03 (×4): 2 via RESPIRATORY_TRACT
  Filled 2022-06-30 (×2): qty 8.8

## 2022-06-30 MED ORDER — BUPIVACAINE-EPINEPHRINE (PF) 0.5% -1:200000 IJ SOLN
INTRAMUSCULAR | Status: DC | PRN
  Administered 2022-06-30: 25 mL via PERINEURAL

## 2022-06-30 MED ORDER — FLUTICASONE PROPIONATE 50 MCG/ACT NA SUSP
1.0000 | Freq: Every day | NASAL | Status: DC
  Administered 2022-07-02: 1 via NASAL
  Filled 2022-06-30: qty 16

## 2022-06-30 MED ORDER — FENTANYL CITRATE (PF) 100 MCG/2ML IJ SOLN
INTRAMUSCULAR | Status: AC
  Administered 2022-06-30: 25 ug
  Filled 2022-06-30: qty 2

## 2022-06-30 MED ORDER — ONDANSETRON HCL 4 MG/2ML IJ SOLN: 4.0000 mg | Freq: Four times a day (QID) | INTRAMUSCULAR | Status: DC | PRN

## 2022-06-30 MED ORDER — ALBUTEROL SULFATE (2.5 MG/3ML) 0.083% IN NEBU: 2.5000 mg | INHALATION_SOLUTION | RESPIRATORY_TRACT | Status: DC | PRN

## 2022-06-30 MED ORDER — METHOCARBAMOL 500 MG PO TABS
500.0000 mg | ORAL_TABLET | Freq: Four times a day (QID) | ORAL | Status: DC | PRN
  Administered 2022-07-02: 500 mg via ORAL
  Filled 2022-06-30: qty 1

## 2022-06-30 MED ORDER — ONDANSETRON HCL 4 MG/2ML IJ SOLN
4.0000 mg | Freq: Once | INTRAMUSCULAR | Status: AC
  Administered 2022-06-30: 4 mg via INTRAVENOUS
  Filled 2022-06-30: qty 2

## 2022-06-30 MED ORDER — ACETAMINOPHEN 325 MG PO TABS
650.0000 mg | ORAL_TABLET | Freq: Four times a day (QID) | ORAL | Status: DC | PRN
  Administered 2022-07-01 – 2022-07-02 (×3): 650 mg via ORAL
  Filled 2022-06-30 (×3): qty 2

## 2022-06-30 MED ORDER — ALPRAZOLAM 0.25 MG PO TABS
0.2500 mg | ORAL_TABLET | Freq: Two times a day (BID) | ORAL | Status: DC | PRN
  Administered 2022-07-02: 0.25 mg via ORAL
  Filled 2022-06-30: qty 1

## 2022-06-30 MED ORDER — MORPHINE SULFATE (PF) 4 MG/ML IV SOLN
4.0000 mg | INTRAVENOUS | Status: DC | PRN
  Administered 2022-06-30: 4 mg via INTRAVENOUS
  Filled 2022-06-30: qty 1

## 2022-06-30 MED ORDER — LORATADINE 10 MG PO TABS
10.0000 mg | ORAL_TABLET | Freq: Every day | ORAL | Status: DC
  Administered 2022-07-02 – 2022-07-03 (×2): 10 mg via ORAL
  Filled 2022-06-30 (×2): qty 1

## 2022-06-30 MED ORDER — ATORVASTATIN CALCIUM 40 MG PO TABS
40.0000 mg | ORAL_TABLET | Freq: Every day | ORAL | Status: DC
  Administered 2022-07-02 – 2022-07-03 (×2): 40 mg via ORAL
  Filled 2022-06-30 (×2): qty 1

## 2022-06-30 MED ORDER — MIRABEGRON ER 25 MG PO TB24
25.0000 mg | ORAL_TABLET | Freq: Every day | ORAL | Status: DC
  Administered 2022-07-02 – 2022-07-03 (×2): 25 mg via ORAL
  Filled 2022-06-30 (×3): qty 1

## 2022-06-30 MED ORDER — FAMOTIDINE 20 MG PO TABS
40.0000 mg | ORAL_TABLET | Freq: Two times a day (BID) | ORAL | Status: DC
  Administered 2022-06-30 – 2022-07-03 (×5): 40 mg via ORAL
  Filled 2022-06-30 (×5): qty 2

## 2022-06-30 MED ORDER — DICYCLOMINE HCL 20 MG PO TABS
20.0000 mg | ORAL_TABLET | Freq: Three times a day (TID) | ORAL | Status: DC
  Administered 2022-06-30 – 2022-07-03 (×10): 20 mg via ORAL
  Filled 2022-06-30 (×15): qty 1

## 2022-06-30 MED ORDER — ALBUTEROL SULFATE HFA 108 (90 BASE) MCG/ACT IN AERS: 2.0000 | INHALATION_SPRAY | RESPIRATORY_TRACT | Status: DC | PRN

## 2022-06-30 MED ORDER — LIDOCAINE-EPINEPHRINE (PF) 1.5 %-1:200000 IJ SOLN
INTRAMUSCULAR | Status: DC | PRN
  Administered 2022-06-30: 5 mL via PERINEURAL

## 2022-06-30 MED ORDER — POLYETHYLENE GLYCOL 3350 17 G PO PACK: 17.0000 g | PACK | Freq: Every day | ORAL | Status: DC | PRN

## 2022-06-30 MED ORDER — METHOCARBAMOL 1000 MG/10ML IJ SOLN: 500.0000 mg | Freq: Four times a day (QID) | INTRAVENOUS | Status: DC | PRN

## 2022-06-30 MED ORDER — POLYVINYL ALCOHOL 1.4 % OP SOLN: 1.0000 [drp] | OPHTHALMIC | Status: DC | PRN

## 2022-06-30 MED ORDER — DOCUSATE SODIUM 100 MG PO CAPS
100.0000 mg | ORAL_CAPSULE | Freq: Two times a day (BID) | ORAL | Status: DC
  Administered 2022-07-01 – 2022-07-03 (×4): 100 mg via ORAL
  Filled 2022-06-30 (×5): qty 1

## 2022-06-30 MED ORDER — MORPHINE SULFATE (PF) 2 MG/ML IV SOLN: 2.0000 mg | INTRAVENOUS | Status: DC | PRN

## 2022-06-30 NOTE — ED Triage Notes (Signed)
Pt. Stated, I fell during the night around 1200 am and hit my left hip ,and hit my head twice on the sink and floor. Pt takes Elaquis.

## 2022-06-30 NOTE — Anesthesia Preprocedure Evaluation (Signed)
Anesthesia Evaluation  Patient identified by MRN, date of birth, ID band Patient awake    Reviewed: Allergy & Precautions, H&P , NPO status , Patient's Chart, lab work & pertinent test results  Airway Mallampati: III  TM Distance: >3 FB Neck ROM: Full    Dental  (+) Edentulous Upper, Edentulous Lower   Pulmonary neg shortness of breath, asthma , neg sleep apnea, neg recent URI   breath sounds clear to auscultation       Cardiovascular hypertension, Pt. on medications (-) angina (-) Past MI + dysrhythmias Atrial Fibrillation  Rhythm:Regular     Neuro/Psych  Headaches, neg Seizures  Anxiety      negative psych ROS   GI/Hepatic Neg liver ROS,GERD  ,,  Endo/Other  negative endocrine ROS    Renal/GU negative Renal ROS     Musculoskeletal  (+) Arthritis ,  Left hip fracture   Abdominal   Peds  Hematology negative hematology ROS (+) Lab Results      Component                Value               Date                      WBC                      11.4 (H)            06/30/2022                HGB                      12.3                06/30/2022                HCT                      35.5 (L)            06/30/2022                MCV                      90.6                06/30/2022                PLT                      251                 06/30/2022            Eliquis last does 1/4 am   Anesthesia Other Findings   Reproductive/Obstetrics negative OB ROS                             Anesthesia Physical Anesthesia Plan  ASA: 2  Anesthesia Plan: Regional   Post-op Pain Management: Regional block*   Induction:   PONV Risk Score and Plan: 2 and Treatment may vary due to age or medical condition  Airway Management Planned: Natural Airway  Additional Equipment: None  Intra-op Plan:   Post-operative Plan:   Informed Consent: I have reviewed the patients History and Physical, chart,  labs and discussed the procedure including the risks, benefits and alternatives for the proposed anesthesia with the patient or authorized representative who has indicated his/her understanding and acceptance.       Plan Discussed with:   Anesthesia Plan Comments:        Anesthesia Quick Evaluation

## 2022-06-30 NOTE — Consult Note (Signed)
Reason for Consult:Left hip fx Referring Physician: Dorie Rank Time called: 0240 Time at bedside: [place]   Shannon Martin is an 82 y.o. female.  HPI: Shannon Martin was at home and slipped in her bathroom. She had immediate left hip pain. She managed to get up but was in considerable pain. She was brought to the ED where x-rays showed a left hip fx and orthopedic surgery was consulted. She lives at home alone and does not use any assistive devices to ambulate.  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   Laryngopharyngeal reflux (LPR) 03/24/2015   Memory changes 09/16/2021   Migraines 1943 to present   "fairly frequently"; Takes Propranolol (01/12/2015)   Mixed hyperlipidemia 12/11/2018   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   Overactive bladder    Takes Vesicare   PAF (paroxysmal atrial fibrillation) ([place]) 05/04/2021   Palpitations 04/08/2021   Seborrheic dermatitis 12/21/2020    Past Surgical History:  Procedure Laterality Date   BREAST BIOPSY Left ~ [facility], BILATERAL Bilateral 2003   CHEST TUBE INSERTION  January 2015   X 2 at [facility]  4-5   JOINT REPLACEMENT     REPLACEMENT TOTAL KNEE Left    TONSILLECTOMY AND ADENOIDECTOMY  1946; 1947   TOTAL KNEE ARTHROPLASTY Left 01/11/2015   TOTAL KNEE ARTHROPLASTY Left 01/11/2015   Procedure: Left TOTAL KNEE ARTHROPLASTY;  Surgeon: Vickey Huger, MD;  Location: [facility];  Service: Orthopedics;  Laterality: Left;   VIDEO ASSISTED THORACOSCOPY (VATS)/THOROCOTOMY Left 07/13/2013   VATS with insertion of chest tubes    Family History  Problem Relation Age of Onset    Hypertension Mother    Diabetes Father    Hypertension Maternal Grandmother    Hypertension Maternal Grandfather     Social History:  reports that she has never smoked. She has never used smokeless tobacco. She reports current alcohol use of about 2.0 standard drinks of alcohol per week. She reports that she does not use drugs.  Allergies:  Allergies  Allergen Reactions   Tetanus Toxoids Swelling and Other (See Comments)    Swelling around throat and jaws   Bee Venom Swelling and Other (See Comments)    Crusty area on skin   Hornet Venom Swelling    Crusty area on skin   Dilaudid [Hydromorphone] Nausea And Vomiting    Causes nausea and vomiting   Erythromycin Nausea And Vomiting   Fiorinal [Butalbital-Aspirin-Caffeine]     Altered mental status   Amoxicillin Other (See Comments)    dizzy   Doxycycline Other (See Comments)    dizzy   Latex Other (See Comments)    Skin will crack open and bleed   Neomycin Other (See Comments)    Irritates skin   Penicillins Itching, Swelling and Rash    Medications: I have reviewed the patient's current medications.  Results for orders placed or performed during the hospital encounter of 06/30/22 (from the past 48 hour(s))  Comprehensive metabolic panel     Status: Abnormal   Collection Time: 06/30/22  9:02 AM  Result Value Ref Range   Sodium 138 135 - 145 mmol/L   Potassium 3.5 3.5 -  5.1 mmol/L   Chloride 104 98 - 111 mmol/L   CO2 22 22 - 32 mmol/L   Glucose, Bld 118 (H) 70 - 99 mg/dL    Comment: Glucose reference range applies only to samples taken after fasting for at least 8 hours.   BUN 19 8 - 23 mg/dL   Creatinine, Ser 0.96 0.44 - 1.00 mg/dL   Calcium 9.5 8.9 - 10.3 mg/dL   Total Protein 6.5 6.5 - 8.1 g/dL   Albumin 3.8 3.5 - 5.0 g/dL   AST 30 15 - 41 U/L   ALT 21 0 - 44 U/L   Alkaline Phosphatase 85 38 - 126 U/L   Total Bilirubin 0.5 0.3 - 1.2 mg/dL   GFR, Estimated 60 (L) >60 mL/min    Comment: (NOTE) Calculated using the  CKD-EPI Creatinine Equation (2021)    Anion gap 12 5 - 15    Comment: Performed at [facility], [address] [place], [place] [address]  Ethanol     Status: None   Collection Time: 06/30/22  9:02 AM  Result Value Ref Range   Alcohol, Ethyl (B) <10 <10 mg/dL    Comment: (NOTE) Lowest detectable limit for serum alcohol is 10 mg/dL.  For medical purposes only. Performed at [facility], [address] [place], [place] [address]   Type and screen [facility]     Status: None   Collection Time: 06/30/22  9:02 AM  Result Value Ref Range   ABO/RH(D) B POS    Antibody Screen NEG    Sample Expiration      07/03/2022,2359 Performed at [facility], [address] [place], [place] [address]   CBC     Status: Abnormal   Collection Time: 06/30/22  9:02 AM  Result Value Ref Range   WBC 11.4 (H) 4.0 - 10.5 K/uL   RBC 3.92 3.87 - 5.11 MIL/uL   Hemoglobin 12.3 12.0 - 15.0 g/dL   HCT 35.5 (L) 36.0 - 46.0 %   MCV 90.6 80.0 - 100.0 fL   MCH 31.4 26.0 - 34.0 pg   MCHC 34.6 30.0 - 36.0 g/dL   RDW 12.5 11.5 - 15.5 %   Platelets 251 150 - 400 K/uL   nRBC 0.0 0.0 - 0.2 %    Comment: Performed at [facility], [address] [place],  [address]    DG Hip Unilat With Pelvis 2-3 Views Left  Result Date: 06/30/2022 CLINICAL DATA:  Pain post fall EXAM: DG HIP (WITH OR WITHOUT PELVIS) 2-3V LEFT COMPARISON:  12/23/2021 FINDINGS: Impacted mid cervical fracture of the left femur. No dislocation. Bony pelvis intact. 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 Aortic Atherosclerosis (ICD10-170.0). IMPRESSION: Impacted mid cervical left femoral fracture. Electronically Signed   By: D  Hassell M.D.   On: 06/30/2022 10:08   DG Chest 1 View  Result Date: 06/30/2022 CLINICAL DATA:  Pain after fall EXAM: CHEST  1 VIEW COMPARISON:  May 04, 2021 FINDINGS: Possible sclerosis in the proximal right humerus, not present on the comparison chest  x-ray. Visualized bones are otherwise unremarkable. The heart, hila, mediastinum, lungs, and pleura are otherwise unremarkable. IMPRESSION: 1. Possible sclerosis in the proximal right humerus, not present on the comparison chest x-ray. Recommend dedicated imaging of the right humerus. 2. No other abnormalities. Electronically Signed   By: David  Williams III M.D.   On: 06/30/2022 10:07   CT HEAD WO CONTRAST  Result Date:   06/30/2022 CLINICAL DATA:  Fall.  Patient is chronically anticoagulated. EXAM: CT HEAD WITHOUT CONTRAST TECHNIQUE: Contiguous axial images were obtained from the base of the skull through the vertex without intravenous contrast. RADIATION DOSE REDUCTION: This exam was performed according to the departmental dose-optimization program which includes automated exposure control, adjustment of the mA and/or kV according to patient size and/or use of iterative reconstruction technique. COMPARISON:  08/27/2019 FINDINGS: Brain: There is no evidence for acute hemorrhage, hydrocephalus, mass lesion, or abnormal extra-axial fluid collection. No definite CT evidence for acute infarction. Diffuse loss of parenchymal volume is consistent with atrophy. Vascular: Choose 1 Skull: No evidence for fracture. No worrisome lytic or sclerotic lesion. Sinuses/Orbits: Mucosal thickening with air-fluid level noted left maxillary sinus with areas of mucosal thickening noted in the ethmoid air cells and opacification of the hypoplastic left frontal sinus. No evidence for mastoid effusion. Visualized portions of the globes and intraorbital fat are unremarkable. Other: None. IMPRESSION: 1. No acute intracranial abnormality. 2. Mild atrophy. 3. 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 Electronically Signed   By: Misty Stanley M.D.   On: 06/30/2022 09:33    Review of Systems  HENT:  Negative for ear discharge, ear pain, hearing loss and tinnitus.   Eyes:  Negative for photophobia and pain.  Respiratory:  Negative for cough  and shortness of breath.   Cardiovascular:  Negative for chest pain.  Gastrointestinal:  Negative for abdominal pain, nausea and vomiting.  Genitourinary:  Negative for dysuria, flank pain, frequency and urgency.  Musculoskeletal:  Positive for arthralgias (Left hip). Negative for back pain, myalgias and neck pain.  Neurological:  Negative for dizziness and headaches.  Hematological:  Does not bruise/bleed easily.  Psychiatric/Behavioral:  The patient is not nervous/anxious.    Blood pressure (!) 166/66, pulse 82, temperature 98.3 F (36.8 C), temperature source Oral, resp. rate 18, height 4\' 11"  (1.499 m), weight 66.7 kg, SpO2 97 %. Physical Exam Constitutional:      General: She is not in acute distress.    Appearance: She is well-developed. She is not diaphoretic.  HENT:     Head: Normocephalic and atraumatic.  Eyes:     General: No scleral icterus.       Right eye: No discharge.        Left eye: No discharge.     Conjunctiva/sclera: Conjunctivae normal.  Cardiovascular:     Rate and Rhythm: Normal rate and regular rhythm.  Pulmonary:     Effort: Pulmonary effort is normal. No respiratory distress.  Musculoskeletal:     Cervical back: Normal range of motion.     Comments: LLE No traumatic wounds, ecchymosis, or rash  Hip TTP  No knee or ankle effusion  Knee stable to varus/ valgus and anterior/posterior stress  Sens DPN, SPN, TN intact  Motor EHL, ext, flex, evers 5/5  DP 2+, PT 2+, No significant edema  Skin:    General: Skin is warm and dry.  Neurological:     Mental Status: She is alert.  Psychiatric:        Mood and Affect: Mood normal.        Behavior: Behavior normal.     Assessment/Plan: Left hip fx -- Plan THA tomorrow with Dr. Zachery Dakins. Please keep NPO after MN and hold Eliquis.    Lisette Abu, PA-C Orthopedic Surgery [phone] 06/30/2022, 10:49 AM

## 2022-06-30 NOTE — H&P (View-Only) (Signed)
Reason for Consult:Left hip fx Referring Physician: Dorie Rank Time called: 0240 Time at bedside: [place]   Shannon Martin is an 82 y.o. female.  HPI: [name] was at home and slipped in her bathroom. She had immediate left hip pain. She managed to get up but was in considerable pain. She was brought to the ED where x-rays showed a left hip fx and orthopedic surgery was consulted. She lives at home alone and does not use any assistive devices to ambulate.  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   Laryngopharyngeal reflux (LPR) 03/24/2015   Memory changes 09/16/2021   Migraines 1943 to present   "fairly frequently"; Takes Propranolol (01/12/2015)   Mixed hyperlipidemia 12/11/2018   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   Overactive bladder    Takes Vesicare   PAF (paroxysmal atrial fibrillation) ([facility]) 05/04/2021   Palpitations 04/08/2021   Seborrheic dermatitis 12/21/2020    Past Surgical History:  Procedure Laterality Date   BREAST BIOPSY Left ~ [facility], BILATERAL Bilateral 2003   CHEST TUBE INSERTION  January 2015   X 2 at [facility]  4-5   JOINT REPLACEMENT     REPLACEMENT TOTAL KNEE Left    TONSILLECTOMY AND ADENOIDECTOMY  1946; 1947   TOTAL KNEE ARTHROPLASTY Left 01/11/2015   TOTAL KNEE ARTHROPLASTY Left 01/11/2015   Procedure: Left TOTAL KNEE ARTHROPLASTY;  Surgeon: Vickey Huger, MD;  Location: [facility];  Service: Orthopedics;  Laterality: Left;   VIDEO ASSISTED THORACOSCOPY (VATS)/THOROCOTOMY Left 07/13/2013   VATS with insertion of chest tubes    Family History  Problem Relation Age of Onset    Hypertension Mother    Diabetes Father    Hypertension Maternal Grandmother    Hypertension Maternal Grandfather     Social History:  reports that she has never smoked. She has never used smokeless tobacco. She reports current alcohol use of about 2.0 standard drinks of alcohol per week. She reports that she does not use drugs.  Allergies:  Allergies  Allergen Reactions   Tetanus Toxoids Swelling and Other (See Comments)    Swelling around throat and jaws   Bee Venom Swelling and Other (See Comments)    Crusty area on skin   Hornet Venom Swelling    Crusty area on skin   Dilaudid [Hydromorphone] Nausea And Vomiting    Causes nausea and vomiting   Erythromycin Nausea And Vomiting   Fiorinal [Butalbital-Aspirin-Caffeine]     Altered mental status   Amoxicillin Other (See Comments)    dizzy   Doxycycline Other (See Comments)    dizzy   Latex Other (See Comments)    Skin will crack open and bleed   Neomycin Other (See Comments)    Irritates skin   Penicillins Itching, Swelling and Rash    Medications: I have reviewed the patient's current medications.  Results for orders placed or performed during the hospital encounter of 06/30/22 (from the past 48 hour(s))  Comprehensive metabolic panel     Status: Abnormal   Collection Time: 06/30/22  9:02 AM  Result Value Ref Range   Sodium 138 135 - 145 mmol/L   Potassium 3.5 3.5 -  5.1 mmol/L   Chloride 104 98 - 111 mmol/L   CO2 22 22 - 32 mmol/L   Glucose, Bld 118 (H) 70 - 99 mg/dL    Comment: Glucose reference range applies only to samples taken after fasting for at least 8 hours.   BUN 19 8 - 23 mg/dL   Creatinine, Ser 0.96 0.44 - 1.00 mg/dL   Calcium 9.5 8.9 - 10.3 mg/dL   Total Protein 6.5 6.5 - 8.1 g/dL   Albumin 3.8 3.5 - 5.0 g/dL   AST 30 15 - 41 U/L   ALT 21 0 - 44 U/L   Alkaline Phosphatase 85 38 - 126 U/L   Total Bilirubin 0.5 0.3 - 1.2 mg/dL   GFR, Estimated 60 (L) >60 mL/min    Comment: (NOTE) Calculated using the  CKD-EPI Creatinine Equation (2021)    Anion gap 12 5 - 15    Comment: Performed at [facility] [address], [place], [place] [address]  Ethanol     Status: None   Collection Time: 06/30/22  9:02 AM  Result Value Ref Range   Alcohol, Ethyl (B) <10 <10 mg/dL    Comment: (NOTE) Lowest detectable limit for serum alcohol is 10 mg/dL.  For medical purposes only. Performed at [facility], [place] [address], [place], [place] [address]   Type and screen [facility]     Status: None   Collection Time: 06/30/22  9:02 AM  Result Value Ref Range   ABO/RH(D) B POS    Antibody Screen NEG    Sample Expiration      07/03/2022,2359 Performed at [facility], [place] [address], [place], [place] [address]   CBC     Status: Abnormal   Collection Time: 06/30/22  9:02 AM  Result Value Ref Range   WBC 11.4 (H) 4.0 - 10.5 K/uL   RBC 3.92 3.87 - 5.11 MIL/uL   Hemoglobin 12.3 12.0 - 15.0 g/dL   HCT 35.5 (L) 36.0 - 46.0 %   MCV 90.6 80.0 - 100.0 fL   MCH 31.4 26.0 - 34.0 pg   MCHC 34.6 30.0 - 36.0 g/dL   RDW 12.5 11.5 - 15.5 %   Platelets 251 150 - 400 K/uL   nRBC 0.0 0.0 - 0.2 %    Comment: Performed at [facility], [place] [address] [place],  [address]    DG Hip Unilat With Pelvis 2-3 Views Left  Result Date: 06/30/2022 CLINICAL DATA:  Pain post fall EXAM: DG HIP (WITH OR WITHOUT PELVIS) 2-3V LEFT COMPARISON:  12/23/2021 FINDINGS: Impacted mid cervical fracture of the left femur. No dislocation. Bony pelvis intact. 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 Aortic Atherosclerosis (ICD10-170.0). IMPRESSION: Impacted mid cervical left femoral fracture. Electronically Signed   By: Lucrezia Europe M.D.   On: 06/30/2022 10:08   DG Chest 1 View  Result Date: 06/30/2022 CLINICAL DATA:  Pain after fall EXAM: CHEST  1 VIEW COMPARISON:  May 04, 2021 FINDINGS: Possible sclerosis in the proximal right humerus, not present on the comparison chest  x-ray. Visualized bones are otherwise unremarkable. The heart, hila, mediastinum, lungs, and pleura are otherwise unremarkable. IMPRESSION: 1. Possible sclerosis in the proximal right humerus, not present on the comparison chest x-ray. Recommend dedicated imaging of the right humerus. 2. No other abnormalities. Electronically Signed   By: Dorise Bullion III M.D.   On: 06/30/2022 10:07   CT HEAD WO CONTRAST  Result Date:  06/30/2022 CLINICAL DATA:  Fall.  Patient is chronically anticoagulated. EXAM: CT HEAD WITHOUT CONTRAST TECHNIQUE: Contiguous axial images were obtained from the base of the skull through the vertex without intravenous contrast. RADIATION DOSE REDUCTION: This exam was performed according to the departmental dose-optimization program which includes automated exposure control, adjustment of the mA and/or kV according to patient size and/or use of iterative reconstruction technique. COMPARISON:  08/27/2019 FINDINGS: Brain: There is no evidence for acute hemorrhage, hydrocephalus, mass lesion, or abnormal extra-axial fluid collection. No definite CT evidence for acute infarction. Diffuse loss of parenchymal volume is consistent with atrophy. Vascular: Choose 1 Skull: No evidence for fracture. No worrisome lytic or sclerotic lesion. Sinuses/Orbits: Mucosal thickening with air-fluid level noted left maxillary sinus with areas of mucosal thickening noted in the ethmoid air cells and opacification of the hypoplastic left frontal sinus. No evidence for mastoid effusion. Visualized portions of the globes and intraorbital fat are unremarkable. Other: None. IMPRESSION: 1. No acute intracranial abnormality. 2. Mild atrophy. 3. 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 Electronically Signed   By: Misty Stanley M.D.   On: 06/30/2022 09:33    Review of Systems  HENT:  Negative for ear discharge, ear pain, hearing loss and tinnitus.   Eyes:  Negative for photophobia and pain.  Respiratory:  Negative for cough  and shortness of breath.   Cardiovascular:  Negative for chest pain.  Gastrointestinal:  Negative for abdominal pain, nausea and vomiting.  Genitourinary:  Negative for dysuria, flank pain, frequency and urgency.  Musculoskeletal:  Positive for arthralgias (Left hip). Negative for back pain, myalgias and neck pain.  Neurological:  Negative for dizziness and headaches.  Hematological:  Does not bruise/bleed easily.  Psychiatric/Behavioral:  The patient is not nervous/anxious.    Blood pressure (!) 166/66, pulse 82, temperature 98.3 F (36.8 C), temperature source Oral, resp. rate 18, height 4\' 11"  (1.499 m), weight 66.7 kg, SpO2 97 %. Physical Exam Constitutional:      General: She is not in acute distress.    Appearance: She is well-developed. She is not diaphoretic.  HENT:     Head: Normocephalic and atraumatic.  Eyes:     General: No scleral icterus.       Right eye: No discharge.        Left eye: No discharge.     Conjunctiva/sclera: Conjunctivae normal.  Cardiovascular:     Rate and Rhythm: Normal rate and regular rhythm.  Pulmonary:     Effort: Pulmonary effort is normal. No respiratory distress.  Musculoskeletal:     Cervical back: Normal range of motion.     Comments: LLE No traumatic wounds, ecchymosis, or rash  Hip TTP  No knee or ankle effusion  Knee stable to varus/ valgus and anterior/posterior stress  Sens DPN, SPN, TN intact  Motor EHL, ext, flex, evers 5/5  DP 2+, PT 2+, No significant edema  Skin:    General: Skin is warm and dry.  Neurological:     Mental Status: She is alert.  Psychiatric:        Mood and Affect: Mood normal.        Behavior: Behavior normal.     Assessment/Plan: Left hip fx -- Plan THA tomorrow with Dr. Zachery Dakins. Please keep NPO after MN and hold Eliquis.    Lisette Abu, PA-C Orthopedic Surgery [phone] 06/30/2022, 10:49 AM

## 2022-06-30 NOTE — ED Provider Notes (Signed)
[facility] EMERGENCY DEPARTMENT Provider Note   CSN: [id number] Arrival date & time: 06/30/22  0845     History  Chief Complaint  Patient presents with   Fall   Hip Pain   Head Injury    Shannon Martin is a 82 y.o. female.   Fall  Hip Pain  Head Injury    Patient presents to the ED for evaluation of a hip injury.  Patient states she was walking in the bathroom early this morning around 12 AM when her toe got caught and she ended up tripping and falling.  Patient landed on her left hip.  Patient states she did hit her head but denies any headache.  No neck pain.  Patient primarily having pain in her left hip and cannot stand.  She denies any other injuries in her knee ankle shoulders or arms.  She has not been feeling sick or ill recently  Home Medications Prior to Admission medications   Medication Sig Start Date End Date Taking? Authorizing Provider  albuterol (VENTOLIN HFA) 108 (90 Base) MCG/ACT inhaler Inhale two puffs every four to six hours as needed for cough or wheeze. 03/27/22   Lillard Anes, MD  ALPRAZolam Duanne Moron) 0.25 MG tablet Take 1 tablet (0.25 mg total) by mouth 2 (two) times daily as needed for anxiety. 05/16/22   Lillard Anes, MD  apixaban (ELIQUIS) 5 MG TABS tablet Take 1 tablet (5 mg total) by mouth 2 (two) times daily. 03/22/22   Revankar, Reita Cliche, MD  ascorbic acid (VITAMIN C) 1000 MG tablet Take 1,000 mg by mouth daily.    [provider]  aspirin EC 81 MG tablet Take 81 mg by mouth daily.    [provider]  atorvastatin (LIPITOR) 40 MG tablet Take 1 tablet (40 mg total) by mouth daily. 03/27/22   Lillard Anes, MD  B Complex-C-Calcium (B-COMPLEX/VITAMIN C, W/ CA, PO) Take by mouth.    [provider]  budesonide-formoterol (SYMBICORT) 80-4.5 MCG/ACT inhaler INHALE 2 PUFFS INTO LUNGS TWICE DAILY, IN THE MORNING AND EVENING 03/27/22   Lillard Anes, MD  Calcium  Carb-Cholecalciferol (CALCIUM + D3) 600-200 MG-UNIT TABS Take 1 tablet by mouth 2 (two) times daily.    [provider]  celecoxib (CELEBREX) 100 MG capsule TAKE 1 CAPSULE(100 MG) BY MOUTH TWICE DAILY 03/27/22   Lillard Anes, MD  Cholecalciferol (D3-1000) 25 MCG (1000 UT) capsule Take 1,000 Units by mouth daily.    [provider]  dicyclomine (BENTYL) 20 MG tablet TAKE ONE TABLET BY MOUTH FOUR TIMES DAILY BEFORE MEALS AND AT BEDTIME 04/17/22   Lillard Anes, MD  estradiol (ESTRACE) 0.1 MG/GM vaginal cream Place 1 Applicatorful vaginally at bedtime.    [provider]  famotidine (PEPCID) 40 MG tablet TAKE 1 TABLET(40 MG) BY MOUTH TWICE DAILY 03/27/22   Lillard Anes, MD  fluticasone [facility]) 50 MCG/ACT nasal spray Can use one spray in each nostril once daily as directed. 03/27/22   Lillard Anes, MD  GEMTESA 75 MG TABS Take 1 tablet by mouth daily. 09/16/21   Lillard Anes, MD  hydrocortisone (ANUSOL-HC) 2.5 % rectal cream Place 1 Application rectally 2 (two) times daily. 05/24/22   Lillard Anes, MD  Hypromellose (ARTIFICIAL TEARS OP) Apply 1 drop to eye as needed for dry eyes.    [provider]  loratadine (CLARITIN) 10 MG tablet Take 10 mg by mouth daily.    [provider]  Multiple Vitamins-Minerals (HAIR SKIN & NAILS PO) Take by mouth.    [provider]  Multiple Vitamins-Minerals (MULTIVITAMIN PO) Take 1 tablet by mouth daily.    [provider]  tamsulosin (FLOMAX) 0.4 MG CAPS capsule Take 0.4 mg by mouth daily.    [provider]  VITAMIN E PO Take 400 Units by mouth 2 (two) times daily.    [provider]      Allergies    Tetanus toxoids, Bee venom, Hornet venom, Dilaudid [hydromorphone], Erythromycin, Fiorinal [butalbital-aspirin-caffeine], Amoxicillin, Doxycycline, Latex, Neomycin, and Penicillins    Review of Systems   Review of Systems  Physical  Exam Updated Vital Signs BP (!) 166/66   Pulse 82   Temp 98.3 F (36.8 C) (Oral)   Resp 18   Ht 1.499 m (4\' 11" )   Wt 66.7 kg   SpO2 97%   BMI 29.69 kg/m  Physical Exam Vitals and nursing note reviewed.  Constitutional:      Appearance: She is well-developed.  HENT:     Head: Normocephalic and atraumatic.     Right Ear: External ear normal.     Left Ear: External ear normal.  Eyes:     General: No scleral icterus.       Right eye: No discharge.        Left eye: No discharge.     Conjunctiva/sclera: Conjunctivae normal.  Neck:     Trachea: No tracheal deviation.  Cardiovascular:     Rate and Rhythm: Normal rate and regular rhythm.  Pulmonary:     Effort: Pulmonary effort is normal. No respiratory distress.     Breath sounds: Normal breath sounds. No stridor. No wheezing or rales.  Abdominal:     General: Bowel sounds are normal. There is no distension.     Palpations: Abdomen is soft.     Tenderness: There is no abdominal tenderness. There is no guarding or rebound.  Musculoskeletal:     Right shoulder: Normal.     Left shoulder: Normal.     Right forearm: Normal.     Left forearm: Normal.     Right wrist: Normal.     Left wrist: Normal.     Cervical back: Normal and neck supple.     Thoracic back: Normal.     Lumbar back: Normal.     Left hip: Deformity, tenderness and bony tenderness present.     Right knee: Normal.     Left knee: Normal.     Comments: Lower extremity decreased in length compared to the right  Skin:    General: Skin is warm and dry.     Findings: No rash.  Neurological:     General: No focal deficit present.     Mental Status: She is alert.     Cranial Nerves: No cranial nerve deficit, dysarthria or facial asymmetry.     Sensory: No sensory deficit.     Motor: No abnormal muscle tone or seizure activity.     Coordination: Coordination normal.  Psychiatric:        Mood and Affect: Mood normal.     ED Results / Procedures / Treatments    Labs (all labs ordered are listed, but only abnormal results are displayed) Labs Reviewed  COMPREHENSIVE METABOLIC PANEL - Abnormal; Notable for the following components:      Result Value   Glucose, Bld 118 (*)    GFR, Estimated 60 (*)    All other components  within normal limits  CBC - Abnormal; Notable for the following components:   WBC 11.4 (*)    HCT 35.5 (*)    All other components within normal limits  ETHANOL  URINALYSIS, ROUTINE W REFLEX MICROSCOPIC  LACTIC ACID, PLASMA  I-STAT CHEM 8, ED  TYPE AND SCREEN  ABO/RH    EKG EKG Interpretation  Date/Time:  Friday June 30 2022 09:41:29 EST Ventricular Rate:  70 PR Interval:  186 QRS Duration: 86 QT Interval:  405 QTC Calculation: 422 R Axis:   54 Text Interpretation: Sinus rhythm Ventricular premature complex No significant change since last tracing Confirmed by Linwood Dibbles [phone]) on 06/30/2022 9:43:07 AM  Radiology DG Hip Unilat With Pelvis 2-3 Views Left  Result Date: 06/30/2022 CLINICAL DATA:  Pain post fall EXAM: DG HIP (WITH OR WITHOUT PELVIS) 2-3V LEFT COMPARISON:  12/23/2021 FINDINGS: Impacted mid cervical fracture of the left femur. No dislocation. Bony pelvis intact. 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 Aortic Atherosclerosis (ICD10-170.0). IMPRESSION: Impacted mid cervical left femoral fracture. Electronically Signed   By: Corlis Leak M.D.   On: 06/30/2022 10:08   DG Chest 1 View  Result Date: 06/30/2022 CLINICAL DATA:  Pain after fall EXAM: CHEST  1 VIEW COMPARISON:  May 04, 2021 FINDINGS: Possible sclerosis in the proximal right humerus, not present on the comparison chest x-ray. Visualized bones are otherwise unremarkable. The heart, hila, mediastinum, lungs, and pleura are otherwise unremarkable. IMPRESSION: 1. Possible sclerosis in the proximal right humerus, not present on the comparison chest x-ray. Recommend dedicated imaging of the right humerus. 2. No other abnormalities.  Electronically Signed   By: Gerome Sam III M.D.   On: 06/30/2022 10:07   CT HEAD WO CONTRAST  Result Date: 06/30/2022 CLINICAL DATA:  Fall.  Patient is chronically anticoagulated. EXAM: CT HEAD WITHOUT CONTRAST TECHNIQUE: Contiguous axial images were obtained from the base of the skull through the vertex without intravenous contrast. RADIATION DOSE REDUCTION: This exam was performed according to the departmental dose-optimization program which includes automated exposure control, adjustment of the mA and/or kV according to patient size and/or use of iterative reconstruction technique. COMPARISON:  08/27/2019 FINDINGS: Brain: There is no evidence for acute hemorrhage, hydrocephalus, mass lesion, or abnormal extra-axial fluid collection. No definite CT evidence for acute infarction. Diffuse loss of parenchymal volume is consistent with atrophy. Vascular: Choose 1 Skull: No evidence for fracture. No worrisome lytic or sclerotic lesion. Sinuses/Orbits: Mucosal thickening with air-fluid level noted left maxillary sinus with areas of mucosal thickening noted in the ethmoid air cells and opacification of the hypoplastic left frontal sinus. No evidence for mastoid effusion. Visualized portions of the globes and intraorbital fat are unremarkable. Other: None. IMPRESSION: 1. No acute intracranial abnormality. 2. Mild atrophy. 3. 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 Electronically Signed   By: Kennith Center M.D.   On: 06/30/2022 09:33    Procedures Procedures    Medications Ordered in ED Medications  morphine (PF) 4 MG/ML injection 4 mg (has no administration in time range)  ondansetron (ZOFRAN) injection 4 mg (has no administration in time range)    ED Course/ Medical Decision Making/ A&P Clinical Course as of 06/30/22 1050  Fri Jun 30, 2022  1010 Prelim review of hip films, femoral neck fracture [JK]  1010 DG Chest 1 View Chest x-ray suggest possible sclerosis of the right humerus.  Humerus films  recommended [JK]  1010 CT HEAD WO CONTRAST Head CT without acute abnormalities other than sinusitis findings [JK]  1020  Case discussed with Earney Hamburg, [place] orthopedic service. [JK]  1047 Case reviewed with Dr. Ophelia Charter [JK]  1049 Labs reviewed.  CBC metabolic panel unremarkable. [JK]    Clinical Course User Index [JK] Linwood Dibbles, MD                           Medical Decision Making Problems Addressed: Closed fracture of left hip, initial encounter [facility]): acute illness or injury that poses a threat to life or bodily functions  Amount and/or Complexity of Data Reviewed Labs: ordered. Decision-making details documented in ED Course. Radiology: ordered and independent interpretation performed. Decision-making details documented in ED Course. Discussion of management or test interpretation with external provider(s): Orthopedics and hospitalist service  Risk Prescription drug management. Decision regarding hospitalization.   Patient presented to the ED for evaluation after mechanical fall.  Patient activated as a level 2 trauma because of her use of anticoagulants and hitting her head.  Fortunately head CT does not show any signs of serious head injury.  Patient denies any significant pain or discomfort in her head.  She does have a left femoral neck fracture.  This correlates with her covid.  Consult with orthopedics.  Plan on admission to the hospital for further treatment.  Incidental numerous findings noted.  Will get formal x-ray as recommended.  CT also suggest some sinus inflammation.  Patient states she often has sign issues and has recently been getting over a cold.  Do not feel treatment for bacterial sinusitis indicated at this time        Final Clinical Impression(s) / ED Diagnoses Final diagnoses:  Closed fracture of left hip, initial encounter [facility])    Rx / DC Orders ED Discharge Orders     None         Linwood Dibbles, MD 06/30/22 1050

## 2022-06-30 NOTE — Anesthesia Procedure Notes (Signed)
Anesthesia Regional Block: Peng block   Pre-Anesthetic Checklist: , timeout performed,  Correct Patient, Correct Site, Correct Laterality,  Correct Procedure, Correct Position, site marked,  Risks and benefits discussed,  Surgical consent,  Pre-op evaluation,  At surgeon's request and post-op pain management  Laterality: Left and Lower  Prep: chloraprep       Needles:  Injection technique: Single-shot      Needle Length: 9cm  Needle Gauge: 22     Additional Needles: Arrow StimuQuik ECHO Echogenic Stimulating PNB Needle  Procedures:,,,, ultrasound used (permanent image in chart),,    Narrative:  Start time: 06/30/2022 1:27 PM End time: 06/30/2022 1:33 PM Injection made incrementally with aspirations every 5 mL.  Performed by: Personally  Anesthesiologist: Oleta Mouse, MD

## 2022-06-30 NOTE — Anesthesia Preprocedure Evaluation (Signed)
Anesthesia Evaluation  Patient identified by MRN, date of birth, ID band Patient awake    Reviewed: Allergy & Precautions, H&P , NPO status , Patient's Chart, lab work & pertinent test results  Airway Mallampati: III  TM Distance: >3 FB Neck ROM: Full    Dental  (+) Edentulous Upper, Edentulous Lower   Pulmonary neg shortness of breath, asthma , neg sleep apnea, neg recent URI   Pulmonary exam normal breath sounds clear to auscultation       Cardiovascular hypertension, Pt. on medications (-) angina (-) Past MI + dysrhythmias Atrial Fibrillation  Rhythm:Regular Rate:Normal     Neuro/Psych  Headaches, neg Seizures  Anxiety      negative psych ROS   GI/Hepatic Neg liver ROS,GERD  ,,  Endo/Other  negative endocrine ROS    Renal/GU negative Renal ROS     Musculoskeletal  (+) Arthritis ,  Left hip fracture   Abdominal   Peds  Hematology negative hematology ROS (+) Lab Results      Component                Value               Date                      WBC                      11.4 (H)            06/30/2022                HGB                      12.3                06/30/2022                HCT                      35.5 (L)            06/30/2022                MCV                      90.6                06/30/2022                PLT                      251                 06/30/2022            Eliquis last does 1/4 am   Anesthesia Other Findings   Reproductive/Obstetrics                             Anesthesia Physical Anesthesia Plan  ASA: 3  Anesthesia Plan: General   Post-op Pain Management: Ofirmev IV (intra-op)*   Induction: Intravenous  PONV Risk Score and Plan: 2 and Treatment may vary due to age or medical condition, Ondansetron and Dexamethasone  Airway Management Planned: Oral ETT  Additional Equipment: None  Intra-op Plan:   Post-operative Plan: Extubation in  OR  Informed Consent: I have  reviewed the patients History and Physical, chart, labs and discussed the procedure including the risks, benefits and alternatives for the proposed anesthesia with the patient or authorized representative who has indicated his/her understanding and acceptance.     Dental advisory given  Plan Discussed with: CRNA  Anesthesia Plan Comments:        Anesthesia Quick Evaluation

## 2022-06-30 NOTE — Progress Notes (Signed)
Patient receiving nerve block by Ermalene Postin, MD. Verbal for 25mg  fentanyl once. Given, unable to release order.

## 2022-06-30 NOTE — H&P (Signed)
History and Physical    Patient: Shannon Martin [id number] DOB: 1941/05/19 DOA: 06/30/2022 DOS: the patient was seen and examined on 06/30/2022 PCP: Rochel Brome, MD  Patient coming from: Home - lives alone; NOK: Daughter, [phone]   Chief Complaint: Fall  HPI: Shannon Martin is a 82 y.o. female with medical history significant of asthma; HTN; dementia; HLD; and afib on Eliquis presenting with a fall.  She reports that she awoke about MN, has chronic bowel/bladder problems.  She went into the bathroom and reached around to turn on the light.  She caught her shoe and fell.  She hit her left head on the sink and floor but does not have a headache.  She then landed directly on the left hip.  She takes Eliquis once daily in the morning so her last dose was yesterday AM.    ER Course:  Hip fracture after mechanical fall.  On Eliquis, hit head, negative head CT.  Ortho consulting, surgery likely tomorrow.  Humeral sclerosis? On CXR - humerus xray ordered but no pain.     Review of Systems: As mentioned in the history of present illness. All other systems reviewed and are negative. 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   GERD (gastroesophageal reflux disease)    Iliotibial band syndrome of left side 06/17/2015   Laryngopharyngeal reflux (LPR) 03/24/2015   Memory changes 09/16/2021   Migraines 1943 to present   "fairly frequently"; Takes Propranolol (01/12/2015)   Mixed hyperlipidemia 12/11/2018   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   Overactive bladder    Takes Vesicare   PAF (paroxysmal atrial fibrillation) ([place]) 05/04/2021   Palpitations 04/08/2021   Seborrheic dermatitis 12/21/2020   Past Surgical History:  Procedure Laterality Date   BREAST BIOPSY Left ~ [place], BILATERAL Bilateral 2003   CHEST TUBE INSERTION  January 2015   X 2 at [place]  4-5   JOINT REPLACEMENT     REPLACEMENT TOTAL KNEE Left    TONSILLECTOMY AND ADENOIDECTOMY  1946; 1947   TOTAL KNEE ARTHROPLASTY Left 01/11/2015   TOTAL KNEE ARTHROPLASTY Left 01/11/2015   Procedure: Left TOTAL KNEE ARTHROPLASTY;  Surgeon: Vickey Huger, MD;  Location: [facility];  Service: Orthopedics;  Laterality: Left;   VIDEO ASSISTED THORACOSCOPY (VATS)/THOROCOTOMY Left 07/13/2013   VATS with insertion of chest tubes   Social History:  reports that she has never smoked. She has never used smokeless tobacco. She reports current alcohol use of about 2.0 standard drinks of alcohol per week. She reports that she does not use drugs.  Allergies  Allergen Reactions   Tetanus Toxoids Swelling and Other (See Comments)    Swelling around throat and jaws   Bee Venom Swelling and Other (See Comments)    Crusty area on skin   Hornet Venom Swelling    Crusty area on skin   Dilaudid [Hydromorphone] Nausea And Vomiting    Causes nausea and vomiting   Erythromycin Nausea And Vomiting   Fiorinal [Butalbital-Aspirin-Caffeine]     Altered mental status   Amoxicillin Other (See Comments)    dizzy   Doxycycline Other (See Comments)    dizzy   Latex Other (See Comments)    Skin will crack open and bleed   Neomycin Other (See Comments)  Irritates skin   Penicillins Itching, Swelling and Rash    Family History  Problem Relation Age of Onset   Hypertension Mother    Diabetes Father    Hypertension Maternal Grandmother    Hypertension Maternal Grandfather     Prior to Admission medications   Medication Sig Start Date End Date Taking? Authorizing Provider  albuterol (VENTOLIN HFA) 108 (90 Base) MCG/ACT inhaler Inhale two puffs every four to six hours as needed for cough or wheeze. 03/27/22   Lillard Anes, MD  ALPRAZolam Duanne Moron) 0.25 MG tablet Take 1 tablet  (0.25 mg total) by mouth 2 (two) times daily as needed for anxiety. 05/16/22   Lillard Anes, MD  apixaban (ELIQUIS) 5 MG TABS tablet Take 1 tablet (5 mg total) by mouth 2 (two) times daily. 03/22/22   Revankar, Reita Cliche, MD  ascorbic acid (VITAMIN C) 1000 MG tablet Take 1,000 mg by mouth daily.    [provider]  aspirin EC 81 MG tablet Take 81 mg by mouth daily.    [provider]  atorvastatin (LIPITOR) 40 MG tablet Take 1 tablet (40 mg total) by mouth daily. 03/27/22   Lillard Anes, MD  B Complex-C-Calcium (B-COMPLEX/VITAMIN C, W/ CA, PO) Take by mouth.    [provider]  budesonide-formoterol (SYMBICORT) 80-4.5 MCG/ACT inhaler INHALE 2 PUFFS INTO LUNGS TWICE DAILY, IN THE MORNING AND EVENING 03/27/22   Lillard Anes, MD  Calcium Carb-Cholecalciferol (CALCIUM + D3) 600-200 MG-UNIT TABS Take 1 tablet by mouth 2 (two) times daily.    [provider]  celecoxib (CELEBREX) 100 MG capsule TAKE 1 CAPSULE(100 MG) BY MOUTH TWICE DAILY 03/27/22   Lillard Anes, MD  Cholecalciferol (D3-1000) 25 MCG (1000 UT) capsule Take 1,000 Units by mouth daily.    [provider]  dicyclomine (BENTYL) 20 MG tablet TAKE ONE TABLET BY MOUTH FOUR TIMES DAILY BEFORE MEALS AND AT BEDTIME 04/17/22   Lillard Anes, MD  estradiol (ESTRACE) 0.1 MG/GM vaginal cream Place 1 Applicatorful vaginally at bedtime.    [provider]  famotidine (PEPCID) 40 MG tablet TAKE 1 TABLET(40 MG) BY MOUTH TWICE DAILY 03/27/22   Lillard Anes, MD  fluticasone [facility]) 50 MCG/ACT nasal spray Can use one spray in each nostril once daily as directed. 03/27/22   Lillard Anes, MD  GEMTESA 75 MG TABS Take 1 tablet by mouth daily. 09/16/21   Lillard Anes, MD  hydrocortisone (ANUSOL-HC) 2.5 % rectal cream Place 1 Application rectally 2 (two) times daily. 05/24/22   Lillard Anes, MD  Hypromellose (ARTIFICIAL TEARS OP)  Apply 1 drop to eye as needed for dry eyes.    [provider]  loratadine (CLARITIN) 10 MG tablet Take 10 mg by mouth daily.    [provider]  Multiple Vitamins-Minerals (HAIR SKIN & NAILS PO) Take by mouth.    [provider]  Multiple Vitamins-Minerals (MULTIVITAMIN PO) Take 1 tablet by mouth daily.    [provider]  tamsulosin (FLOMAX) 0.4 MG CAPS capsule Take 0.4 mg by mouth daily.    [provider]  VITAMIN E PO Take 400 Units by mouth 2 (two) times daily.    [provider]    Physical Exam: Vitals:   06/30/22 1000 06/30/22 1235 06/30/22 1335 06/30/22 1533  BP: (!) 166/66 137/74 (!) 141/69 114/77  Pulse:  69 74 76  Resp: 18 11 16 18   Temp:  98.1 F (36.7 C)  99.6 F (37.6 C)  TempSrc:    Oral  SpO2:  96% 94% 98%  Weight:      Height:       General:  Appears calm and comfortable and is in NAD, L hip pain with movement Eyes:   EOMI, normal lids, iris ENT:  grossly normal hearing, lips & tongue, mmm; artifical upper dentition, absent lower dentition Neck:  no LAD, masses or thyromegaly Cardiovascular:  RRR, no m/r/g. No LE edema.  Respiratory:   CTA bilaterally with no wheezes/rales/rhonchi.  Normal respiratory effort. Abdomen:  soft, NT, ND Skin:  no rash or induration seen on limited exam Musculoskeletal:  LLE is shortened and externally rotated; R hammer toe Psychiatric:  grossly normal mood and affect, speech fluent and appropriate, AOx3 Neurologic:  CN 2-12 grossly intact, moves all extremities in coordinated fashion other than LLE due to pain   Radiological Exams on Admission: Independently reviewed - see discussion in A/P where applicable  DG FEMUR PORT MIN 2 VIEWS LEFT  Result Date: 06/30/2022 CLINICAL DATA:  Fall.  Left hip pain.  Left hip fracture. EXAM: LEFT FEMUR PORTABLE 2 VIEWS COMPARISON:  Hip radiographs same date. FINDINGS: The bones are mildly demineralized. A mildly displaced fracture of the mid  left femoral neck is again noted, better seen on dedicated hip radiographs. No distal femoral fracture is identified. Patient is status post left total knee arthroplasty. There is no hardware loosening or significant knee joint effusion. Probable small calcified uterine fibroids. IMPRESSION: Mildly displaced fracture of the left femoral neck. No distal femoral fracture identified. Electronically Signed   By: Richardean Sale M.D.   On: 06/30/2022 11:24   DG Humerus Right  Result Date: 06/30/2022 CLINICAL DATA:  Recent fall EXAM: RIGHT HUMERUS - 2+ VIEW COMPARISON:  None Available. FINDINGS: There is no evidence of acute fracture. Alignment is normal. There is mild glenohumeral and AC joint osteoarthritis. IMPRESSION: Negative right humerus radiographs. Electronically Signed   By: Maurine Simmering M.D.   On: 06/30/2022 11:24   DG Hip Unilat With Pelvis 2-3 Views Left  Result Date: 06/30/2022 CLINICAL DATA:  Pain post fall EXAM: DG HIP (WITH OR WITHOUT PELVIS) 2-3V LEFT COMPARISON:  12/23/2021 FINDINGS: Impacted mid cervical fracture of the left femur. No dislocation. Bony pelvis intact. 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 Aortic Atherosclerosis (ICD10-170.0). IMPRESSION: Impacted mid cervical left femoral fracture. Electronically Signed   By: Lucrezia Europe M.D.   On: 06/30/2022 10:08   DG Chest 1 View  Result Date: 06/30/2022 CLINICAL DATA:  Pain after fall EXAM: CHEST  1 VIEW COMPARISON:  May 04, 2021 FINDINGS: Possible sclerosis in the proximal right humerus, not present on the comparison chest x-ray. Visualized bones are otherwise unremarkable. The heart, hila, mediastinum, lungs, and pleura are otherwise unremarkable. IMPRESSION: 1. Possible sclerosis in the proximal right humerus, not present on the comparison chest x-ray. Recommend dedicated imaging of the right humerus. 2. No other abnormalities. Electronically Signed   By: Dorise Bullion III M.D.   On: 06/30/2022 10:07   CT  HEAD WO CONTRAST  Result Date: 06/30/2022 CLINICAL DATA:  Fall.  Patient is chronically anticoagulated. EXAM: CT HEAD WITHOUT CONTRAST TECHNIQUE: Contiguous axial images were obtained from the base of the skull through the vertex without intravenous contrast. RADIATION DOSE REDUCTION: This exam was performed according to the departmental dose-optimization program which includes automated exposure control, adjustment of the mA and/or kV according to patient size and/or use of iterative reconstruction technique. COMPARISON:  08/27/2019 FINDINGS: Brain: There is no evidence for acute hemorrhage, hydrocephalus, mass lesion, or abnormal extra-axial fluid collection. No definite CT evidence for acute infarction. Diffuse loss of parenchymal volume is consistent with atrophy. Vascular: Choose 1 Skull: No evidence for fracture. No worrisome lytic or sclerotic lesion. Sinuses/Orbits: Mucosal thickening with air-fluid level noted left maxillary sinus with areas of mucosal thickening noted in the ethmoid air cells and opacification of the hypoplastic left frontal sinus. No evidence for mastoid effusion. Visualized portions of the globes and intraorbital fat are unremarkable. Other: None. IMPRESSION: 1. No acute intracranial abnormality. 2. Mild atrophy. 3. 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 Electronically Signed   By: Misty Stanley M.D.   On: 06/30/2022 09:33    EKG: Independently reviewed.  NSR with rate 70; no evidence of acute ischemia   Labs on Admission: I have personally reviewed the available labs and imaging studies at the time of the admission.  Pertinent labs:    Glucose 118 BUN 19/Creatinine 0.96/GFR 60 - stable WBC 11.4 ETOH <10   Assessment and Plan: Principal Problem:   Hip fracture (HCC) Active Problems:   Moderate persistent asthma   Mixed hyperlipidemia   PAF (paroxysmal atrial fibrillation) (HCC)   Anxiety   Urinary retention with incomplete bladder emptying    Hip  fracture -Apparently mechanical fall resulting in hip fracture -Orthopedics consulted -NPO after midnight in anticipation of surgical repair tomorrow -SCDs overnight, start Lovenox post-operatively (or as per ortho) -Pain control with Tylenol, Robaxin, Oxycodone, and Morphine prn -TOC team consult for rehab placement - would prefer [facility] -Will need PT consult post-operatively -Hip fracture order set utilized -TXA per orthopedics -Fascia iliacus block ordered per anesthesia  Pre-operative stratification -Orthopedic/spinal surgery is associated with an intermediate (1-5%) cardiovascular risk for cardiac death and nonfatal MI -Without known ASCVD RF, her revised cardiac index gives a risk estimate of 3.9% (low risk) -It is reasonable for her to go to the OR without additional evaluation  HTN -She does not appear to be taking medications for this issue at this time   HLD -Continue atorvastatin  Afib -Only taking Eliquis once daily, which is ineffective; will hold but suggest resumption at BID dosing post-operatively -She has also been taking ASA daily; will continue ASA while AC is on hold but there is not an obvious indication for both at this time -She does not appear to be taking rate-controlling medications at this time  Anxiety -Continue prn alprazolam  Asthma -Continue Symbicort (Dulera per formulary), loratadine, Flonase, prn albuterol  Urinary retention -Continue Gemtesa (mirabegron per formulary) and tamsulosin     Advance Care Planning:   Code Status: Full Code - Code status was discussed with the patient and/or family at the time of admission.  The patient would want to receive full resuscitative measures at this time.   Consults: Orthopedics; SW, Nutrition; will need PT post-operatively   DVT Prophylaxis: SCDs until approved for Lovenox by orthopedics  Family Communication: None present at the time of my evaluation, but her daughter had been in the ER with her  throughout the visit   Severity of Illness: The appropriate patient status for this patient is INPATIENT. Inpatient status is judged to be reasonable and necessary in order to provide the required intensity of service to ensure the patient's safety. The patient's presenting symptoms, physical exam findings, and initial radiographic and laboratory data in the context of their chronic comorbidities is felt to place them at high risk for further clinical deterioration. Furthermore,  it is not anticipated that the patient will be medically stable for discharge from the hospital within 2 midnights of admission.   * I certify that at the point of admission it is my clinical judgment that the patient will require inpatient hospital care spanning beyond 2 midnights from the point of admission due to high intensity of service, high risk for further deterioration and high frequency of surveillance required.*  Author: Karmen Bongo, MD 06/30/2022 4:55 PM  For on call review [email or web address].

## 2022-07-01 ENCOUNTER — Encounter (HOSPITAL_COMMUNITY): Payer: Self-pay | Admitting: Internal Medicine

## 2022-07-01 ENCOUNTER — Other Ambulatory Visit: Payer: Self-pay

## 2022-07-01 ENCOUNTER — Inpatient Hospital Stay (HOSPITAL_COMMUNITY): Payer: Medicare HMO

## 2022-07-01 ENCOUNTER — Encounter (HOSPITAL_COMMUNITY)
Admission: EM | Disposition: A | Discharge status: Skilled Nursing Facility | Payer: Self-pay | Source: Home / Self Care | Attending: Internal Medicine

## 2022-07-01 ENCOUNTER — Inpatient Hospital Stay (HOSPITAL_COMMUNITY): Payer: Medicare HMO | Admitting: Anesthesiology

## 2022-07-01 DIAGNOSIS — S72009D Fracture of unspecified part of neck of unspecified femur, subsequent encounter for closed fracture with routine healing: Secondary | ICD-10-CM

## 2022-07-01 DIAGNOSIS — S72002A Fracture of unspecified part of neck of left femur, initial encounter for closed fracture: Secondary | ICD-10-CM

## 2022-07-01 DIAGNOSIS — I4891 Unspecified atrial fibrillation: Secondary | ICD-10-CM | POA: Diagnosis not present

## 2022-07-01 DIAGNOSIS — I10 Essential (primary) hypertension: Secondary | ICD-10-CM | POA: Diagnosis not present

## 2022-07-01 DIAGNOSIS — M1990 Unspecified osteoarthritis, unspecified site: Secondary | ICD-10-CM | POA: Diagnosis not present

## 2022-07-01 HISTORY — PX: TOTAL HIP ARTHROPLASTY: SHX124

## 2022-07-01 LAB — CBC
HCT: 29.5 % — ABNORMAL LOW (ref 36.0–46.0)
Hemoglobin: 10.4 g/dL — ABNORMAL LOW (ref 12.0–15.0)
MCH: 31.2 pg (ref 26.0–34.0)
MCHC: 35.3 g/dL (ref 30.0–36.0)
MCV: 88.6 fL (ref 80.0–100.0)
Platelets: 197 10*3/uL (ref 150–400)
RBC: 3.33 MIL/uL — ABNORMAL LOW (ref 3.87–5.11)
RDW: 12.7 % (ref 11.5–15.5)
WBC: 7.8 10*3/uL (ref 4.0–10.5)
nRBC: 0 % (ref 0.0–0.2)

## 2022-07-01 LAB — BASIC METABOLIC PANEL
Anion gap: 9 (ref 5–15)
BUN: 21 mg/dL (ref 8–23)
CO2: 23 mmol/L (ref 22–32)
Calcium: 8.8 mg/dL — ABNORMAL LOW (ref 8.9–10.3)
Chloride: 105 mmol/L (ref 98–111)
Creatinine, Ser: 0.99 mg/dL (ref 0.44–1.00)
GFR, Estimated: 58 mL/min — ABNORMAL LOW (ref >60)
Glucose, Bld: 114 mg/dL — ABNORMAL HIGH (ref 70–99)
Potassium: 3.7 mmol/L (ref 3.5–5.1)
Sodium: 137 mmol/L (ref 135–145)

## 2022-07-01 LAB — SURGICAL PCR SCREEN
MRSA, PCR: NEGATIVE
Staphylococcus aureus: NEGATIVE

## 2022-07-01 SURGERY — ARTHROPLASTY, HIP, TOTAL,POSTERIOR APPROACH
Anesthesia: General | Site: Hip | Laterality: Left

## 2022-07-01 MED ORDER — BUPIVACAINE LIPOSOME 1.3 % IJ SUSP
INTRAMUSCULAR | Status: DC | PRN
  Administered 2022-07-01: 20 mL

## 2022-07-01 MED ORDER — IRRISEPT - 450ML BOTTLE WITH 0.05% CHG IN STERILE WATER, USP 99.95% OPTIME
TOPICAL | Status: DC | PRN
  Administered 2022-07-01: 450 mL

## 2022-07-01 MED ORDER — FENTANYL CITRATE (PF) 100 MCG/2ML IJ SOLN: 25.0000 ug | INTRAMUSCULAR | Status: DC | PRN

## 2022-07-01 MED ORDER — PHENYLEPHRINE 80 MCG/ML (10ML) SYRINGE FOR IV PUSH (FOR BLOOD PRESSURE SUPPORT)
PREFILLED_SYRINGE | INTRAVENOUS | Status: DC | PRN
  Administered 2022-07-01: 160 ug via INTRAVENOUS
  Administered 2022-07-01 (×2): 80 ug via INTRAVENOUS

## 2022-07-01 MED ORDER — LIDOCAINE 2% (20 MG/ML) 5 ML SYRINGE
INTRAMUSCULAR | Status: AC
  Filled 2022-07-01: qty 5

## 2022-07-01 MED ORDER — VANCOMYCIN HCL IN DEXTROSE 1-5 GM/200ML-% IV SOLN
INTRAVENOUS | Status: AC
  Filled 2022-07-01: qty 200

## 2022-07-01 MED ORDER — FENTANYL CITRATE (PF) 250 MCG/5ML IJ SOLN
INTRAMUSCULAR | Status: AC
  Filled 2022-07-01: qty 5

## 2022-07-01 MED ORDER — APIXABAN 5 MG PO TABS: 5.0000 mg | ORAL_TABLET | Freq: Two times a day (BID) | ORAL | Status: DC

## 2022-07-01 MED ORDER — PROPOFOL 10 MG/ML IV BOLUS
INTRAVENOUS | Status: AC
  Filled 2022-07-01: qty 20

## 2022-07-01 MED ORDER — ACETAMINOPHEN 10 MG/ML IV SOLN
INTRAVENOUS | Status: AC
  Filled 2022-07-01: qty 100

## 2022-07-01 MED ORDER — SODIUM CHLORIDE (PF) 0.9 % IJ SOLN
INTRAMUSCULAR | Status: AC
  Filled 2022-07-01: qty 50

## 2022-07-01 MED ORDER — BOOST PLUS PO LIQD
237.0000 mL | ORAL | Status: DC
  Administered 2022-07-02 – 2022-07-03 (×2): 237 mL via ORAL
  Filled 2022-07-01 (×2): qty 237

## 2022-07-01 MED ORDER — LACTATED RINGERS IV SOLN: INTRAVENOUS | Status: DC | PRN

## 2022-07-01 MED ORDER — VANCOMYCIN HCL IN DEXTROSE 1-5 GM/200ML-% IV SOLN
1000.0000 mg | INTRAVENOUS | Status: AC
  Administered 2022-07-01: 1000 mg via INTRAVENOUS

## 2022-07-01 MED ORDER — AMISULPRIDE (ANTIEMETIC) 5 MG/2ML IV SOLN: 10.0000 mg | Freq: Once | INTRAVENOUS | Status: DC | PRN

## 2022-07-01 MED ORDER — POVIDONE-IODINE 10 % EX SWAB
2.0000 | Freq: Once | CUTANEOUS | Status: AC
  Administered 2022-07-01: 2 via TOPICAL

## 2022-07-01 MED ORDER — ORAL CARE MOUTH RINSE: 15.0000 mL | Freq: Once | OROMUCOSAL | Status: AC

## 2022-07-01 MED ORDER — SODIUM CHLORIDE 0.9 % IR SOLN
Status: DC | PRN
  Administered 2022-07-01: 3000 mL

## 2022-07-01 MED ORDER — DEXAMETHASONE SODIUM PHOSPHATE 10 MG/ML IJ SOLN
INTRAMUSCULAR | Status: DC | PRN
  Administered 2022-07-01: 10 mg via INTRAVENOUS

## 2022-07-01 MED ORDER — PHENYLEPHRINE HCL-NACL 20-0.9 MG/250ML-% IV SOLN: INTRAVENOUS | Status: DC | PRN

## 2022-07-01 MED ORDER — ROCURONIUM BROMIDE 10 MG/ML (PF) SYRINGE
PREFILLED_SYRINGE | INTRAVENOUS | Status: AC
  Filled 2022-07-01: qty 10

## 2022-07-01 MED ORDER — ONDANSETRON HCL 4 MG/2ML IJ SOLN
INTRAMUSCULAR | Status: DC | PRN
  Administered 2022-07-01: 4 mg via INTRAVENOUS

## 2022-07-01 MED ORDER — 0.9 % SODIUM CHLORIDE (POUR BTL) OPTIME
TOPICAL | Status: DC | PRN
  Administered 2022-07-01: 1000 mL

## 2022-07-01 MED ORDER — PHENYLEPHRINE HCL-NACL 20-0.9 MG/250ML-% IV SOLN
INTRAVENOUS | Status: DC | PRN
  Administered 2022-07-01: 25 ug/min via INTRAVENOUS

## 2022-07-01 MED ORDER — SODIUM CHLORIDE (PF) 0.9 % IJ SOLN
INTRAMUSCULAR | Status: DC | PRN
  Administered 2022-07-01: 60 mL

## 2022-07-01 MED ORDER — SUGAMMADEX SODIUM 200 MG/2ML IV SOLN
INTRAVENOUS | Status: DC | PRN
  Administered 2022-07-01: 200 mg via INTRAVENOUS

## 2022-07-01 MED ORDER — ACETAMINOPHEN 10 MG/ML IV SOLN
INTRAVENOUS | Status: DC | PRN
  Administered 2022-07-01: 1000 mg via INTRAVENOUS

## 2022-07-01 MED ORDER — LACTATED RINGERS IV SOLN: INTRAVENOUS | Status: DC

## 2022-07-01 MED ORDER — FENTANYL CITRATE (PF) 250 MCG/5ML IJ SOLN
INTRAMUSCULAR | Status: DC | PRN
  Administered 2022-07-01 (×2): 25 ug via INTRAVENOUS
  Administered 2022-07-01: 50 ug via INTRAVENOUS

## 2022-07-01 MED ORDER — DEXAMETHASONE SODIUM PHOSPHATE 10 MG/ML IJ SOLN
INTRAMUSCULAR | Status: AC
  Filled 2022-07-01: qty 1

## 2022-07-01 MED ORDER — BUPIVACAINE LIPOSOME 1.3 % IJ SUSP
INTRAMUSCULAR | Status: AC
  Filled 2022-07-01: qty 20

## 2022-07-01 MED ORDER — PHENYLEPHRINE 80 MCG/ML (10ML) SYRINGE FOR IV PUSH (FOR BLOOD PRESSURE SUPPORT)
PREFILLED_SYRINGE | INTRAVENOUS | Status: AC
  Filled 2022-07-01: qty 10

## 2022-07-01 MED ORDER — OXYCODONE HCL 5 MG PO TABS
2.5000 mg | ORAL_TABLET | ORAL | Status: DC | PRN
  Administered 2022-07-01 – 2022-07-02 (×3): 5 mg via ORAL
  Filled 2022-07-01 (×3): qty 1

## 2022-07-01 MED ORDER — WATER FOR IRRIGATION, STERILE IR SOLN
Status: DC | PRN
  Administered 2022-07-01: 1000 mL

## 2022-07-01 MED ORDER — APIXABAN 2.5 MG PO TABS
2.5000 mg | ORAL_TABLET | Freq: Two times a day (BID) | ORAL | Status: DC
  Administered 2022-07-02 – 2022-07-03 (×3): 2.5 mg via ORAL
  Filled 2022-07-01 (×3): qty 1

## 2022-07-01 MED ORDER — ONDANSETRON HCL 4 MG/2ML IJ SOLN
INTRAMUSCULAR | Status: AC
  Filled 2022-07-01: qty 2

## 2022-07-01 MED ORDER — LIDOCAINE 2% (20 MG/ML) 5 ML SYRINGE
INTRAMUSCULAR | Status: DC | PRN
  Administered 2022-07-01: 60 mg via INTRAVENOUS

## 2022-07-01 MED ORDER — ROCURONIUM BROMIDE 10 MG/ML (PF) SYRINGE
PREFILLED_SYRINGE | INTRAVENOUS | Status: DC | PRN
  Administered 2022-07-01: 60 mg via INTRAVENOUS

## 2022-07-01 MED ORDER — CHLORHEXIDINE GLUCONATE 4 % EX LIQD
60.0000 mL | Freq: Once | CUTANEOUS | Status: AC
  Administered 2022-07-01: 4 via TOPICAL
  Filled 2022-07-01: qty 60

## 2022-07-01 MED ORDER — TRANEXAMIC ACID-NACL 1000-0.7 MG/100ML-% IV SOLN
1000.0000 mg | INTRAVENOUS | Status: AC
  Administered 2022-07-01: 1000 mg via INTRAVENOUS

## 2022-07-01 MED ORDER — VANCOMYCIN HCL IN DEXTROSE 1-5 GM/200ML-% IV SOLN
1000.0000 mg | Freq: Once | INTRAVENOUS | Status: AC
  Administered 2022-07-01: 1000 mg via INTRAVENOUS
  Filled 2022-07-01: qty 200

## 2022-07-01 MED ORDER — PROPOFOL 10 MG/ML IV BOLUS
INTRAVENOUS | Status: DC | PRN
  Administered 2022-07-01: 80 mg via INTRAVENOUS

## 2022-07-01 MED ORDER — FENTANYL CITRATE (PF) 100 MCG/2ML IJ SOLN: 25.0000 ug | Freq: Once | INTRAMUSCULAR | Status: DC

## 2022-07-01 MED ORDER — TRANEXAMIC ACID-NACL 1000-0.7 MG/100ML-% IV SOLN
INTRAVENOUS | Status: AC
  Filled 2022-07-01: qty 100

## 2022-07-01 MED ORDER — CHLORHEXIDINE GLUCONATE 0.12 % MT SOLN: 15.0000 mL | Freq: Once | OROMUCOSAL | Status: AC

## 2022-07-01 MED ORDER — ENSURE ENLIVE PO LIQD
237.0000 mL | ORAL | Status: DC
  Administered 2022-07-01 – 2022-07-02 (×2): 237 mL via ORAL

## 2022-07-01 MED ORDER — CHLORHEXIDINE GLUCONATE 0.12 % MT SOLN
OROMUCOSAL | Status: AC
  Administered 2022-07-01: 15 mL via OROMUCOSAL
  Filled 2022-07-01: qty 15

## 2022-07-01 SURGICAL SUPPLY — 62 items
ADH SKN CLS APL DERMABOND .7 (GAUZE/BANDAGES/DRESSINGS) ×1
APL PRP STRL LF DISP 70% ISPRP (MISCELLANEOUS) ×2
BLADE SAGITTAL 25.0X1.27X90 (BLADE) ×1 IMPLANT
BRUSH FEMORAL CANAL (MISCELLANEOUS) IMPLANT
CHLORAPREP W/TINT 26 (MISCELLANEOUS) ×2 IMPLANT
COVER SURGICAL LIGHT HANDLE (MISCELLANEOUS) ×1 IMPLANT
DERMABOND ADVANCED .7 DNX12 (GAUZE/BANDAGES/DRESSINGS) IMPLANT
DRAPE HALF SHEET 40X57 (DRAPES) ×1 IMPLANT
DRAPE HIP W/POCKET STRL (MISCELLANEOUS) ×1 IMPLANT
DRAPE INCISE IOBAN 66X45 STRL (DRAPES) IMPLANT
DRAPE INCISE IOBAN 85X60 (DRAPES) ×1 IMPLANT
DRAPE POUCH INSTRU U-SHP 10X18 (DRAPES) ×1 IMPLANT
DRAPE U-SHAPE 47X51 STRL (DRAPES) ×2 IMPLANT
DRSG AQUACEL AG ADV 3.5X10 (GAUZE/BANDAGES/DRESSINGS) ×1 IMPLANT
ELECT BLADE 4.0 EZ CLEAN MEGAD (MISCELLANEOUS) ×1
ELECTRODE BLDE 4.0 EZ CLN MEGD (MISCELLANEOUS) ×1 IMPLANT
GLOVE BIOGEL PI IND STRL 8 (GLOVE) ×1 IMPLANT
GLOVE SRG 8 PF TXTR STRL LF DI (GLOVE) ×1 IMPLANT
GLOVE SURG ORTHO 8.0 STRL STRW (GLOVE) ×2 IMPLANT
GLOVE SURG UNDER POLY LF SZ8 (GLOVE) ×1
GOWN STRL REUS W/ TWL LRG LVL3 (GOWN DISPOSABLE) ×2 IMPLANT
GOWN STRL REUS W/ TWL XL LVL3 (GOWN DISPOSABLE) ×1 IMPLANT
GOWN STRL REUS W/TWL LRG LVL3 (GOWN DISPOSABLE) ×2
GOWN STRL REUS W/TWL XL LVL3 (GOWN DISPOSABLE) ×1
HANDPIECE INTERPULSE COAX TIP (DISPOSABLE) ×1
HEAD BIOLOX HIP 36/-2.5 (Joint) IMPLANT
HIP BIOLOX HD 36/-2.5 (Joint) ×1 IMPLANT
HOOD PEEL AWAY FLYTE STAYCOOL (MISCELLANEOUS) ×3 IMPLANT
INSERT 0 DEGREE 36 (Miscellaneous) IMPLANT
IRRIGATION SURGIPHOR STRL (IV SOLUTION) ×1 IMPLANT
KIT BASIN OR (CUSTOM PROCEDURE TRAY) ×1 IMPLANT
KIT TURNOVER KIT A (KITS) ×1 IMPLANT
MANIFOLD NEPTUNE II (INSTRUMENTS) ×1 IMPLANT
MARKER SKIN DUAL TIP RULER LAB (MISCELLANEOUS) ×1 IMPLANT
NDL 18GX1X1/2 (RX/OR ONLY) (NEEDLE) ×1 IMPLANT
NEEDLE 18GX1X1/2 (RX/OR ONLY) (NEEDLE) ×1 IMPLANT
NS IRRIG 1000ML POUR BTL (IV SOLUTION) ×1 IMPLANT
PACK TOTAL JOINT (CUSTOM PROCEDURE TRAY) ×1 IMPLANT
PRESSURIZER FEMORAL UNIV (MISCELLANEOUS) IMPLANT
RETRIEVER SUT HEWSON (MISCELLANEOUS) ×1 IMPLANT
SCREW HEX LP 6.5X20 (Screw) IMPLANT
SCREW HEX LP 6.5X30 (Screw) IMPLANT
SEALER BIPOLAR AQUA 6.0 (INSTRUMENTS) ×1 IMPLANT
SET HNDPC FAN SPRY TIP SCT (DISPOSABLE) ×1 IMPLANT
SHELL ACETAB TRIDENT 48 (Shell) IMPLANT
STEM HIP 127 DEG (Stem) IMPLANT
SUCTION FRAZIER HANDLE 10FR (MISCELLANEOUS) ×1
SUCTION TUBE FRAZIER 10FR DISP (MISCELLANEOUS) ×1 IMPLANT
SUT BONE WAX W31G (SUTURE) ×1 IMPLANT
SUT ETHIBOND 2 V 37 (SUTURE) ×1 IMPLANT
SUT MNCRL AB 3-0 PS2 18 (SUTURE) ×1 IMPLANT
SUT STRATAFIX 1PDS 45CM VIOLET (SUTURE) ×2 IMPLANT
SUT VIC AB 0 CT1 27 (SUTURE) ×1
SUT VIC AB 0 CT1 27XBRD ANBCTR (SUTURE) ×1 IMPLANT
SUT VIC AB 2-0 CT2 27 (SUTURE) ×2 IMPLANT
SYR 20ML LL LF (SYRINGE) ×1 IMPLANT
SYR 50ML LL SCALE MARK (SYRINGE) ×1 IMPLANT
TOWEL GREEN STERILE (TOWEL DISPOSABLE) ×1 IMPLANT
TOWER CARTRIDGE SMART MIX (DISPOSABLE) IMPLANT
TRAY FOLEY MTR SLVR 16FR STAT (SET/KITS/TRAYS/PACK) ×1 IMPLANT
TUBE SUCT ARGYLE STRL (TUBING) ×1 IMPLANT
WATER STERILE IRR 1000ML POUR (IV SOLUTION) ×1 IMPLANT

## 2022-07-01 NOTE — Anesthesia Procedure Notes (Signed)
Procedure Name: Intubation Date/Time: 07/01/2022 7:49 AM  Performed by: Kyung Rudd, CRNAPre-anesthesia Checklist: Patient identified, Emergency Drugs available, Suction available and Patient being monitored Patient Re-evaluated:Patient Re-evaluated prior to induction Oxygen Delivery Method: Circle system utilized Preoxygenation: Pre-oxygenation with 100% oxygen Induction Type: IV induction Ventilation: Mask ventilation without difficulty Laryngoscope Size: Mac and 3 Grade View: Grade I Tube type: Oral Tube size: 7.0 mm Number of attempts: 1 Airway Equipment and Method: Stylet Placement Confirmation: ETT inserted through vocal cords under direct vision, positive ETCO2 and breath sounds checked- equal and bilateral Secured at: 19 cm Tube secured with: Tape Dental Injury: Teeth and Oropharynx as per pre-operative assessment

## 2022-07-01 NOTE — Plan of Care (Signed)

## 2022-07-01 NOTE — Plan of Care (Signed)

## 2022-07-01 NOTE — Progress Notes (Signed)
Pt taken to OR.  This RN is unaware that PT has left the floor.  Daughter called and updated that surgery time is 0730.

## 2022-07-01 NOTE — Progress Notes (Signed)
Initial Nutrition Assessment  DOCUMENTATION CODES:   Not applicable  INTERVENTION:  - Continue Regular diet.  - Ensure Plus High Protein po once daily, provides 350 kcal and 20 grams of protein. - Boost Plus po once daily, provides 360 kcal and 14 grams of protein. - Encourage intake at all meals and of supplements.  - Monitor weight trends.    NUTRITION DIAGNOSIS:   Increased nutrient needs related to acute illness as evidenced by estimated needs.  GOAL:   Patient will meet greater than or equal to 90% of their needs  MONITOR:   Weight trends, PO intake, Supplement acceptance, Labs  REASON FOR ASSESSMENT:   Consult Hip fracture protocol  ASSESSMENT:   82 y.o. female with medical history significant of asthma; HTN; dementia; HLD; and afib on Eliquis presenting with a fall with resulting hip fracture.  Called and spoke with both patient and patient's daughter Tye [place]. Patient reports a UBW of 140# and did not feel she has had any recent changes in weight. Per EMR, patient weighed at 157# 1 year ago and dropped to 141# by September - a 16# or 10% weight loss in 8 months, not significant for the time frame. Weight has been stable/increased since that time.   Patient reports she typically eats 2 meals a day but occasionally 3.  Breakfast: hot tea with cookies or oatmeal Lunch: occasionally consumes, will have a sandwich Dinner: cooks a lot of vegetables and eats salads, eats meat only a few times a week Patient does not consume any nutrition supplements at home as she notes they can be very expensive.   Patient's current appetite is good and she was able to eat some cream of potato soup after returning to her room from surgery this afternoon. She is agreeable to try a few different nutrition supplements to support intake and aid in meeting increased nutrient needs for healing.  Medications reviewed and include: Colace  Labs reviewed:  -   NUTRITION - FOCUSED PHYSICAL  EXAM:  RD working remotely  Diet Order:   Diet Order             Diet regular Room service appropriate? Yes; Fluid consistency: Thin  Diet effective now                   EDUCATION NEEDS:  Education needs have been addressed  Skin:  Skin Assessment: Skin Integrity Issues: Skin Integrity Issues:: Incisions Incisions: L hip  Last BM:  PTA  Height:  Ht Readings from Last 1 Encounters:  07/01/22 4' 11.02" (1.499 m)   Weight:  Wt Readings from Last 1 Encounters:  07/01/22 66.7 kg    BMI:  Body mass index is 29.67 kg/m.  Estimated Nutritional Needs:  Kcal:  1850-2000 kcals Protein:  85-100 grams Fluid:  >/= 1.8L    Samson Frederic RD, LDN For contact information, refer to [facility].

## 2022-07-01 NOTE — Discharge Instructions (Signed)
INSTRUCTIONS AFTER JOINT REPLACEMENT   Remove items at home which could result in a fall. This includes throw rugs or furniture in walking pathways ICE to the affected joint every three hours while awake for 30 minutes at a time, for at least the first 3-5 days, and then as needed for pain and swelling.  Continue to use ice for pain and swelling. You may notice swelling that will progress down to the foot and ankle.  This is normal after surgery.  Elevate your leg when you are not up walking on it.   Continue to use the breathing machine you got in the hospital (incentive spirometer) which will help keep your temperature down.  It is common for your temperature to cycle up and down following surgery, especially at night when you are not up moving around and exerting yourself.  The breathing machine keeps your lungs expanded and your temperature down.   DIET:  As you were doing prior to hospitalization, we recommend a well-balanced diet.  DRESSING / WOUND CARE / SHOWERING  Keep the surgical dressing until follow up.  The dressing is water proof, so you can shower without any extra covering.  IF THE DRESSING FALLS OFF or the wound gets wet inside, change the dressing with sterile gauze.  Please use good hand washing techniques before changing the dressing.  Do not use any lotions or creams on the incision until instructed by your surgeon.    ACTIVITY  Increase activity slowly as tolerated, but follow the weight bearing instructions below.   No driving for 6 weeks or until further direction given by your physician.  You cannot drive while taking narcotics.  No lifting or carrying greater than 10 lbs. until further directed by your surgeon. Avoid periods of inactivity such as sitting longer than an hour when not asleep. This helps prevent blood clots.  You may return to work once you are authorized by your doctor.     WEIGHT BEARING   Weight bearing as tolerated with assist device (walker, cane,  etc) as directed, use it as long as suggested by your surgeon or therapist, typically at least 4-6 weeks.   EXERCISES  Results after joint replacement surgery are often greatly improved when you follow the exercise, range of motion and muscle strengthening exercises prescribed by your doctor. Safety measures are also important to protect the joint from further injury. Any time any of these exercises cause you to have increased pain or swelling, decrease what you are doing until you are comfortable again and then slowly increase them. If you have problems or questions, call your caregiver or physical therapist for advice.   Rehabilitation is important following a joint replacement. After just a few days of immobilization, the muscles of the leg can become weakened and shrink (atrophy).  These exercises are designed to build up the tone and strength of the thigh and leg muscles and to improve motion. Often times heat used for twenty to thirty minutes before working out will loosen up your tissues and help with improving the range of motion but do not use heat for the first two weeks following surgery (sometimes heat can increase post-operative swelling).   These exercises can be done on a training (exercise) mat, on the floor, on a table or on a bed. Use whatever works the best and is most comfortable for you.    Use music or television while you are exercising so that the exercises are a pleasant break in your   day. This will make your life better with the exercises acting as a break in your routine that you can look forward to.   Perform all exercises about fifteen times, three times per day or as directed.  You should exercise both the operative leg and the other leg as well.  Exercises include:   Quad Sets - Tighten up the muscle on the front of the thigh (Quad) and hold for 5-10 seconds.   Straight Leg Raises - With your knee straight (if you were given a brace, keep it on), lift the leg to 60  degrees, hold for 3 seconds, and slowly lower the leg.  Perform this exercise against resistance later as your leg gets stronger.  Leg Slides: Lying on your back, slowly slide your foot toward your buttocks, bending your knee up off the floor (only go as far as is comfortable). Then slowly slide your foot back down until your leg is flat on the floor again.  Angel Wings: Lying on your back spread your legs to the side as far apart as you can without causing discomfort.  Hamstring Strength:  Lying on your back, push your heel against the floor with your leg straight by tightening up the muscles of your buttocks.  Repeat, but this time bend your knee to a comfortable angle, and push your heel against the floor.  You may put a pillow under the heel to make it more comfortable if necessary.   A rehabilitation program following joint replacement surgery can speed recovery and prevent re-injury in the future due to weakened muscles. Contact your doctor or a physical therapist for more information on knee rehabilitation.    CONSTIPATION  Constipation is defined medically as fewer than three stools per week and severe constipation as less than one stool per week.  Even if you have a regular bowel pattern at home, your normal regimen is likely to be disrupted due to multiple reasons following surgery.  Combination of anesthesia, postoperative narcotics, change in appetite and fluid intake all can affect your bowels.   YOU MUST use at least one of the following options; they are listed in order of increasing strength to get the job done.  They are all available over the counter, and you may need to use some, POSSIBLY even all of these options:    Drink plenty of fluids (prune juice may be helpful) and high fiber foods Colace 100 mg by mouth twice a day  Senokot for constipation as directed and as needed Dulcolax (bisacodyl), take with full glass of water  Miralax (polyethylene glycol) once or twice a day as  needed.  If you have tried all these things and are unable to have a bowel movement in the first 3-4 days after surgery call either your surgeon or your primary doctor.    If you experience loose stools or diarrhea, hold the medications until you stool forms back up.  If your symptoms do not get better within 1 week or if they get worse, check with your doctor.  If you experience "the worst abdominal pain ever" or develop nausea or vomiting, please contact the office immediately for further recommendations for treatment.   ITCHING:  If you experience itching with your medications, try taking only a single pain pill, or even half a pain pill at a time.  You can also use Benadryl over the counter for itching or also to help with sleep.   TED HOSE STOCKINGS:  Use stockings on both   legs until for at least 2 weeks or as directed by physician office. They may be removed at night for sleeping.  MEDICATIONS:  See your medication summary on the "After Visit Summary" that nursing will review with you.  You may have some home medications which will be placed on hold until you complete the course of blood thinner medication.  It is important for you to complete the blood thinner medication as prescribed.   Blood clot prevention (DVT Prophylaxis): After surgery you are at an increased risk for a blood clot. You will resume your eliquis after surgery to help reduce your risk of getting a blood clot. This will help prevent a blood clot. Signs of a pulmonary embolus (blood clot in the lungs) include sudden short of breath, feeling lightheaded or dizzy, chest pain with a deep breath, rapid pulse rapid breathing. Signs of a blood clot in your arms or legs include new unexplained swelling and cramping, warm, red or darkened skin around the painful area. Please call the office or 911 right away if these signs or symptoms develop.  PRECAUTIONS:  If you experience chest pain or shortness of breath - call 911 immediately  for transfer to the hospital emergency department.   If you develop a fever greater that 101 F, purulent drainage from wound, increased redness or drainage from wound, foul odor from the wound/dressing, or calf pain - CONTACT YOUR SURGEON.                                                   FOLLOW-UP APPOINTMENTS:  If you do not already have a post-op appointment, please call the office for an appointment to be seen by your surgeon.  Guidelines for how soon to be seen are listed in your "After Visit Summary", but are typically between 2-3 weeks after surgery.   POST-OPERATIVE OPIOID TAPER INSTRUCTIONS: It is important to wean off of your opioid medication as soon as possible. If you do not need pain medication after your surgery it is ok to stop day one. Opioids include: Codeine, Hydrocodone(Norco, Vicodin), Oxycodone(Percocet, oxycontin) and hydromorphone amongst others.  Long term and even short term use of opiods can cause: Increased pain response Dependence Constipation Depression Respiratory depression And more.  Withdrawal symptoms can include Flu like symptoms Nausea, vomiting And more Techniques to manage these symptoms Hydrate well Eat regular healthy meals Stay active Use relaxation techniques(deep breathing, meditating, yoga) Do Not substitute Alcohol to help with tapering If you have been on opioids for less than two weeks and do not have pain than it is ok to stop all together.  Plan to wean off of opioids This plan should start within one week post op of your joint replacement. Maintain the same interval or time between taking each dose and first decrease the dose.  Cut the total daily intake of opioids by one tablet each day Next start to increase the time between doses. The last dose that should be eliminated is the evening dose.   MAKE SURE YOU:  Understand these instructions.  Get help right away if you are not doing well or get worse.    Thank you for letting  us be a part of your medical care team.  It is a privilege we respect greatly.  We hope these instructions will help you stay on track  for a fast and full recovery!

## 2022-07-01 NOTE — Progress Notes (Addendum)
Had to put another order in for PCR.  Surgery prep, bath, and check list done.

## 2022-07-01 NOTE — Op Note (Addendum)
07/01/2022  9:46 AM  PATIENT:  Shannon Martin   MRN: [id number]  PRE-OPERATIVE DIAGNOSIS:  Left displaced femoral neck fracture  POST-OPERATIVE DIAGNOSIS:  same  PROCEDURE:  Procedure(s): Left total hip arthroplasty posterior approach  PREOPERATIVE INDICATIONS:    Shannon Martin is an 82 y.o. female who has a diagnosis of Left displaced femoral neck fracture and elected for surgical management.  Patient is an independent ambulator without assistive device at baseline.  Felt to be a good candidate for total hip arthroplasty the risks benefits and alternatives were discussed with the patient including but not limited to the risks of nonoperative treatment, versus surgical intervention including infection, bleeding, nerve injury, periprosthetic fracture, the need for revision surgery, dislocation, leg length discrepancy, blood clots, cardiopulmonary complications, morbidity, mortality, among others, and they were willing to proceed.     OPERATIVE REPORT     SURGEON:  Weber Cooks, MD    ASSISTANT: Skip Mayer, PA-C, (Present throughout the entire procedure,  necessary for completion of procedure in a timely manner, assisting with retraction, instrumentation, and closure)     ANESTHESIA: General  ESTIMATED BLOOD LOSS: 250cc    COMPLICATIONS:  None.     COMPONENTS:   [employer] Trident 2 TriTanium 48 mm acetabular shell, Trident X.3 neutral polyethylene liner, 6.5 hex screws x 2, Accolade 2 with 127 degree neck angle size #5, 36-2.5 ceramic head Implant Name Type Inv. Item Serial No. Manufacturer Lot No. LRB No. Used Action  SHELL ACETAB TRIDENT 48 - [id number] Shell SHELL ACETAB TRIDENT 48  [employer] [id number] A Left 1 Implanted  SCREW HEX LP 6.5X30 - [id number] Screw SCREW HEX LP 6.5X30  [employer] [id number] Left 1 Implanted  SCREW HEX LP 6.5X20 - [id number] Screw SCREW HEX LP 6.5X20  [employer] [id number] Left 1 Implanted  INSERT 0 DEGREE 36 - [id number]  Miscellaneous INSERT 0 DEGREE 36  [employer] [id number] Left 1 Implanted  STEM HIP 127 DEG - [id number] Stem STEM HIP 127 DEG  [employer] [id number] A Left 1 Implanted  HIP BIOLOX HD 36/-2.5 - [id number] Joint HIP BIOLOX HD 36/-2.5  [employer] [id number] Left 1 Implanted      PROCEDURE IN DETAIL:   The patient was met in the holding area and  identified.  The appropriate hip was identified and marked at the operative site.  The patient was then transported to the OR  and  placed under anesthesia.  At that point, the patient was  placed in the lateral decubitus position with the operative side up and  secured to the operating room table  and all bony prominences padded. A subaxillary role was also placed.    The operative lower extremity was prepped from the iliac crest to the distal leg.  Sterile draping was performed.  Preoperative antibiotics, 1 gm of vanc (given hx of swelling with PCN),1 gm of Tranexamic Acid, and Decadron administered. Time out was performed prior to incision.      A routine posterolateral approach was utilized via sharp dissection  carried down to the subcutaneous tissue.  Gross bleeders were Bovie coagulated.  The iliotibial band was identified and incised along the length of the skin incision through the glute max fascia.  Charnley retractor was placed with care to protect the sciatic nerve posteriorly.  With the hip internally rotated, the piriformis tendon was identified and released from the femoral insertion and tagged with a #2 Ethibond.  A capsulotomy was then performed off the femoral insertion and also tagged  with a #2 Ethibond.    The femoral neck was exposed, and I resected the femoral neck just below the distal aspect of the fracture. This left about 1cm of calcar above the lesser troch.    I then exposed the deep acetabulum, cleared out any tissue including the ligamentum teres.  After adequate visualization, I excised the labrum.  I then started  reaming with a 44 mm reamer, first medializing to the floor of the cotyloid fossa, and then in the position of the cup aiming towards the greater sciatic notch, matching the version of the transverse acetabular ligament and tucked under the anterior wall. I reamed up to 48 mm reamer with good bony bed preparation and a 48 mm cup was chosen.  The real cup was then impacted into place.  Appropriate version and inclination was confirmed clinically matching their bony anatomy, and also with the use of the jig.  I placed 2 screws in the posterior superior quadrant to augment fixation.  A neutral liner was placed and impacted. It was confirmed to be appropriately seated and the acetabular retractors were removed.    I then prepared the proximal femur using the box cutter, Charnley awl, and then sequentially broached starting with 0 up to a size 5.  A trial broach, neck, and head was utilized, and I reduced the hip and it was found to have excellent stability.  There was no impingement with full extension and 90 degrees external rotation.  The hip was stable at the position of sleep and with 90 degrees flexion and 90 degrees of internal rotation.  Leg lengths were also clinically assessed in the lateral position and felt to be equal. Intra-Op flatplate was obtained and confirmed appropriate component positions.  Good fill of the femur with the size 5 broach.  And restoration of leg length and offset. No evidence or concern for fracture.  A final femoral prosthesis size 5 was selected. I then impacted the real femoral prosthesis into place.I again trialed and selected a 36 -2.22mm ball. The hip was then reduced and taken through a range of motion. There was no impingement with full extension and 90 degrees external rotation.  The hip was stable at the position of sleep and with 90 degrees flexion and  90 degrees of internal rotation. Leg lengths were  again assessed and felt to be restored.  We then opened, and I  impacted the real head ball into place.  The posterior capsule was then closed with #2 Ethibond.  The piriformis was repaired through the base of the abductor tendon using a [place] suture passer.  I then irrigated the hip copiously with Irrisept irrigation and with normal saline pulse lavage. Periarticular injection was then performed with Exparel.   We repaired the fascia #1 barbed suture, followed by 0 barbed suture for the subcutaneous fat.  Skin was closed with 2-0 Vicryl and 3-0 Monocryl.  Dermabond and Aquacel dressing were applied. The patient was then awakened and returned to PACU in stable and satisfactory condition.  Leg lengths in the supine position were assessed and felt to be clinically equal. There were no complications.  Post op recs: WB: WBAT LLE, No formal hip precautions Abx: Vancomycin given history of penicillin swelling reaction Imaging: PACU pelvis Xray Dressing: Aquacell, keep intact until follow up DVT prophylaxis: Eliquis 2.5 mg twice daily starting postop day 1 and 2, resume Eliquis 5 mg starting postop day 3 Follow up: 2 weeks after surgery for a wound check  with Dr. Zachery Dakins at [facility].  Address: [address], [place], [place] [address]  Office Phone: [phone]   Charlies Constable, MD Orthopedic Surgeon

## 2022-07-01 NOTE — Interval H&P Note (Signed)
The patient has been re-examined, and the chart reviewed, and there have been no interval changes to the documented history and physical.    Plan for L THA for displaced femoral neck fx.  The operative side was examined and the patient was confirmed to have sensation to DPN, SPN, TN intact, Motor EHL, ext, flex 5/5, and DP 2+, PT 2+, No significant edema.   The risks, benefits, and alternatives have been discussed at length with patient, and the patient is willing to proceed.  Left hip marked. Consent has been signed.  

## 2022-07-01 NOTE — Progress Notes (Signed)
PROGRESS NOTE  Shannon Martin [id number] DOB: 1941-05-17 DOA: 06/30/2022 PCP: Rochel Brome, MD   LOS: 1 day   Brief Narrative / Interim history: 82 year old female with history of asthma, HTN, dementia, HLD, PAF on Eliquis who comes into the hospital after a fall.  She went to the bathroom upon waking up around midnight, tripped and fell.  She was found to have a left hip fracture and was admitted to the hospital.  Orthopedic surgery was consulted  Subjective / 24h Interval events: Seen in the post op area, doing well, no complaints. Pain controlled. Denies chest pain / dyspnea, no nausea/vomiting.   Assesement and Plan: Principal Problem:   Hip fracture ([facility]) Active Problems:   Moderate persistent asthma   Mixed hyperlipidemia   PAF (paroxysmal atrial fibrillation) (HCC)   Anxiety   Urinary retention with incomplete bladder emptying   Principal problem Left hip fracture-following ground-level fall, orthopedic surgery consulted, she was taken to the OR this morning 07/01/2022 and is status post left THA posterior approach -DVT prophylaxis per orthopedic surgery postop, timing to resume anticoagulation to be determined -PT consult, likely needs SNF  Active problems Essential hypertension-does not appear to be taking medications for this, monitor blood pressure while hospitalized  Hyperlipidemia-continue statin  PAF-only taking Eliquis once daily, probably could resume twice daily postoperatively when cleared by Ortho  Anxiety-continue as needed alprazolam  Asthma-stable, no wheezing, continue inhalers as below  Urinary retention-continue tamsulosin, mirabegron   Scheduled Meds:  [MAR Hold] aspirin EC  81 mg Oral Daily   [MAR Hold] atorvastatin  40 mg Oral Daily   [MAR Hold] dicyclomine  20 mg Oral TID AC & HS   [MAR Hold] docusate sodium  100 mg Oral BID   [MAR Hold] famotidine  40 mg Oral BID   [MAR Hold] fluticasone  1 spray Each Nare Daily   [MAR Hold] loratadine   10 mg Oral Daily   [MAR Hold] mirabegron ER  25 mg Oral Daily   [MAR Hold] mometasone-formoterol  2 puff Inhalation BID   [MAR Hold] tamsulosin  0.4 mg Oral Daily   Continuous Infusions:  lactated ringers 10 mL/hr at 07/01/22 0708   [MAR Hold] methocarbamol (ROBAXIN) IV     tranexamic acid     PRN Meds:.0.9 % irrigation (POUR BTL), [MAR Hold] acetaminophen, [MAR Hold] albuterol, [MAR Hold] ALPRAZolam, [MAR Hold] bisacodyl, [MAR Hold] methocarbamol **OR** [MAR Hold] methocarbamol (ROBAXIN) IV, [MAR Hold]  morphine injection, [MAR Hold] ondansetron (ZOFRAN) IV, [MAR Hold] oxyCODONE, [MAR Hold] polyethylene glycol, [MAR Hold] polyvinyl alcohol, sodium chloride irrigation, tranexamic acid, water for irrigation, sterile  Current Outpatient Medications  Medication Instructions   albuterol (VENTOLIN HFA) 108 (90 Base) MCG/ACT inhaler Inhale two puffs every four to six hours as needed for cough or wheeze.   ALPRAZolam (XANAX) 0.25 mg, Oral, 2 times daily PRN   apixaban (ELIQUIS) 5 mg, Oral, 2 times daily   ascorbic acid (VITAMIN C) 1,000 mg, Oral, Daily   aspirin EC 81 mg, Oral, Daily   atorvastatin (LIPITOR) 40 mg, Oral, Daily   B Complex-C-Calcium (B-COMPLEX/VITAMIN C, W/ CA, PO) Oral   budesonide-formoterol (SYMBICORT) 80-4.5 MCG/ACT inhaler INHALE 2 PUFFS INTO LUNGS TWICE DAILY, IN THE MORNING AND EVENING   Calcium Carb-Cholecalciferol (CALCIUM + D3) 600-200 MG-UNIT TABS 1 tablet, Oral, 2 times daily   celecoxib (CELEBREX) 100 MG capsule TAKE 1 CAPSULE(100 MG) BY MOUTH TWICE DAILY   D3-1000 1,000 Units, Oral, Daily   dicyclomine (BENTYL) 20 MG tablet TAKE  ONE TABLET BY MOUTH FOUR TIMES DAILY BEFORE MEALS AND AT BEDTIME   estradiol (ESTRACE) 0.1 MG/GM vaginal cream 1 Applicatorful, Vaginal, Daily at bedtime   famotidine (PEPCID) 40 MG tablet TAKE 1 TABLET(40 MG) BY MOUTH TWICE DAILY   fluticasone (FLONASE) 50 MCG/ACT nasal spray Can use one spray in each nostril once daily as directed.    GEMTESA 75 MG TABS 1 tablet, Oral, Daily   hydrocortisone (ANUSOL-HC) 2.5 % rectal cream 1 Application, Rectal, 2 times daily   Hypromellose (ARTIFICIAL TEARS OP) 1 drop, Ophthalmic, As needed   loratadine (CLARITIN) 10 mg, Oral, Daily   Multiple Vitamins-Minerals (HAIR SKIN & NAILS PO) Oral   Multiple Vitamins-Minerals (MULTIVITAMIN PO) 1 tablet, Oral, Daily   tamsulosin (FLOMAX) 0.4 mg, Oral, Daily   VITAMIN E PO 400 Units, Oral, 2 times daily    Diet Orders (From admission, onward)     Start     Ordered   07/01/22 0001  Diet NPO time specified Except for: Sips with Meds  Diet effective midnight       Question:  Except for  Answer:  Sips with Meds   06/30/22 1303            DVT prophylaxis: SCDs Start: 06/30/22 1303   Lab Results  Component Value Date   PLT 197 07/01/2022      Code Status: Full Code  Family Communication: no family at bedside   Status is: Inpatient  Remains inpatient appropriate because: surgery today   Level of care: Med-Surg  Consultants:  Orthopedic surgery   Objective: Vitals:   06/30/22 1830 06/30/22 1926 07/01/22 0600 07/01/22 0705  BP: 136/61 (!) 129/54 (!) 128/58   Pulse: 79 78    Resp: 18 18 20    Temp:  99.5 F (37.5 C) 98.2 F (36.8 C)   TempSrc:  Oral Oral   SpO2: 96% 95% 93%   Weight:    66.7 kg  Height:    4' 11.02" (1.499 m)    Intake/Output Summary (Last 24 hours) at 07/01/2022 0909 Last data filed at 07/01/2022 0845 Gross per 24 hour  Intake 540 ml  Output 1050 ml  Net -510 ml   Wt Readings from Last 3 Encounters:  07/01/22 66.7 kg  06/14/22 66.7 kg  05/24/22 65.8 kg    Examination:  Constitutional: NAD Eyes: no scleral icterus ENMT: Mucous membranes are moist.  Neck: normal, supple Respiratory: clear to auscultation bilaterally, no wheezing, no crackles. Normal respiratory effort. No accessory muscle use.  Cardiovascular: Regular rate and rhythm, no murmurs / rubs / gallops. No LE edema.  Abdomen: non  distended, no tenderness. Bowel sounds positive.  Musculoskeletal: no clubbing / cyanosis.    Data Reviewed: I have independently reviewed following labs and imaging studies   CBC Recent Labs  Lab 06/30/22 0902 07/01/22 0331  WBC 11.4* 7.8  HGB 12.3 10.4*  HCT 35.5* 29.5*  PLT 251 197  MCV 90.6 88.6  MCH 31.4 31.2  MCHC 34.6 35.3  RDW 12.5 12.7    Recent Labs  Lab 06/30/22 0902 07/01/22 0331  NA 138 137  K 3.5 3.7  CL 104 105  CO2 22 23  GLUCOSE 118* 114*  BUN 19 21  CREATININE 0.96 0.99  CALCIUM 9.5 8.8*  AST 30  --   ALT 21  --   ALKPHOS 85  --   BILITOT 0.5  --   ALBUMIN 3.8  --   LATICACIDVEN 2.6*  --     ------------------------------------------------------------------------------------------------------------------  No results for input(s): "CHOL", "HDL", "LDLCALC", "TRIG", "CHOLHDL", "LDLDIRECT" in the last 72 hours.  No results found for: "HGBA1C" ------------------------------------------------------------------------------------------------------------------ No results for input(s): "TSH", "T4TOTAL", "T3FREE", "THYROIDAB" in the last 72 hours.  Invalid input(s): "FREET3"  Cardiac Enzymes No results for input(s): "CKMB", "TROPONINI", "MYOGLOBIN" in the last 168 hours.  Invalid input(s): "CK" ------------------------------------------------------------------------------------------------------------------ No results found for: "BNP"  CBG: No results for input(s): "GLUCAP" in the last 168 hours.  Recent Results (from the past 240 hour(s))  Surgical pcr screen     Status: None   Collection Time: 07/01/22  4:17 AM   Specimen: Nasal Mucosa; Nasal Swab  Result Value Ref Range Status   MRSA, PCR NEGATIVE NEGATIVE Final   Staphylococcus aureus NEGATIVE NEGATIVE Final    Comment: (NOTE) The Xpert SA Assay (FDA approved for NASAL specimens in patients 80 years of age and older), is one component of a comprehensive surveillance program. It is not  intended to diagnose infection nor to guide or monitor treatment. Performed at [facility], [place] [address] [place], [place] [address]      Radiology Studies: DG FEMUR PORT MIN 2 VIEWS LEFT  Result Date: 06/30/2022 CLINICAL DATA:  Fall.  Left hip pain.  Left hip fracture. EXAM: LEFT FEMUR PORTABLE 2 VIEWS COMPARISON:  Hip radiographs same date. FINDINGS: The bones are mildly demineralized. A mildly displaced fracture of the mid left femoral neck is again noted, better seen on dedicated hip radiographs. No distal femoral fracture is identified. Patient is status post left total knee arthroplasty. There is no hardware loosening or significant knee joint effusion. Probable small calcified uterine fibroids. IMPRESSION: Mildly displaced fracture of the left femoral neck. No distal femoral fracture identified. Electronically Signed   By: Richardean Sale M.D.   On: 06/30/2022 11:24   DG Humerus Right  Result Date: 06/30/2022 CLINICAL DATA:  Recent fall EXAM: RIGHT HUMERUS - 2+ VIEW COMPARISON:  None Available. FINDINGS: There is no evidence of acute fracture. Alignment is normal. There is mild glenohumeral and AC joint osteoarthritis. IMPRESSION: Negative right humerus radiographs. Electronically Signed   By: Maurine Simmering M.D.   On: 06/30/2022 11:24   DG Hip Unilat With Pelvis 2-3 Views Left  Result Date: 06/30/2022 CLINICAL DATA:  Pain post fall EXAM: DG HIP (WITH OR WITHOUT PELVIS) 2-3V LEFT COMPARISON:  12/23/2021 FINDINGS: Impacted mid cervical fracture of the left femur. No dislocation. Bony pelvis intact. 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 Aortic Atherosclerosis (ICD10-170.0). IMPRESSION: Impacted mid cervical left femoral fracture. Electronically Signed   By: Lucrezia Europe M.D.   On: 06/30/2022 10:08   DG Chest 1 View  Result Date: 06/30/2022 CLINICAL DATA:  Pain after fall EXAM: CHEST  1 VIEW COMPARISON:  May 04, 2021 FINDINGS: Possible sclerosis in the proximal  right humerus, not present on the comparison chest x-ray. Visualized bones are otherwise unremarkable. The heart, hila, mediastinum, lungs, and pleura are otherwise unremarkable. IMPRESSION: 1. Possible sclerosis in the proximal right humerus, not present on the comparison chest x-ray. Recommend dedicated imaging of the right humerus. 2. No other abnormalities. Electronically Signed   By: Dorise Bullion III M.D.   On: 06/30/2022 10:07   CT HEAD WO CONTRAST  Result Date: 06/30/2022 CLINICAL DATA:  Fall.  Patient is chronically anticoagulated. EXAM: CT HEAD WITHOUT CONTRAST TECHNIQUE: Contiguous axial images were obtained from the base of the skull through the vertex without intravenous contrast. RADIATION DOSE REDUCTION: This exam was performed according to the departmental  dose-optimization program which includes automated exposure control, adjustment of the mA and/or kV according to patient size and/or use of iterative reconstruction technique. COMPARISON:  08/27/2019 FINDINGS: Brain: There is no evidence for acute hemorrhage, hydrocephalus, mass lesion, or abnormal extra-axial fluid collection. No definite CT evidence for acute infarction. Diffuse loss of parenchymal volume is consistent with atrophy. Vascular: Choose 1 Skull: No evidence for fracture. No worrisome lytic or sclerotic lesion. Sinuses/Orbits: Mucosal thickening with air-fluid level noted left maxillary sinus with areas of mucosal thickening noted in the ethmoid air cells and opacification of the hypoplastic left frontal sinus. No evidence for mastoid effusion. Visualized portions of the globes and intraorbital fat are unremarkable. Other: None. IMPRESSION: 1. No acute intracranial abnormality. 2. Mild atrophy. 3. 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 Electronically Signed   By: Misty Stanley M.D.   On: 06/30/2022 09:33     Marzetta Board, MD, PhD [facility]  Between 7 am - 7 pm I am available, please contact me via Amion (for  emergencies) or Securechat (non urgent messages)  Between 7 pm - 7 am I am not available, please contact night coverage MD/APP via Amion

## 2022-07-01 NOTE — Anesthesia Postprocedure Evaluation (Signed)
Anesthesia Post Note  Patient: Shannon Martin  Procedure(s) Performed: TOTAL HIP ARTHROPLASTY (Left: Hip)     Patient location during evaluation: PACU Anesthesia Type: General Level of consciousness: sedated and patient cooperative Pain management: pain level controlled Vital Signs Assessment: post-procedure vital signs reviewed and stable Respiratory status: spontaneous breathing Cardiovascular status: stable Anesthetic complications: no   No notable events documented.  Last Vitals:  Vitals:   07/01/22 1100 07/01/22 1124  BP: 97/77 (!) 123/55  Pulse: 62 62  Resp: 13 14  Temp: 36.6 C 36.8 C  SpO2: 99% 90%    Last Pain:  Vitals:   07/01/22 1240  TempSrc:   PainSc: [place]

## 2022-07-01 NOTE — Transfer of Care (Signed)
Immediate Anesthesia Transfer of Care Note  Patient: Shannon Martin  Procedure(s) Performed: TOTAL HIP ARTHROPLASTY (Left: Hip)  Patient Location: PACU  Anesthesia Type:General  Level of Consciousness: awake, alert , and oriented  Airway & Oxygen Therapy: Patient Spontanous Breathing  Post-op Assessment: Report given to RN, Post -op Vital signs reviewed and stable, and Patient moving all extremities  Post vital signs: Reviewed and stable  Last Vitals:  Vitals Value Taken Time  BP 136/97 07/01/22 1030  Temp    Pulse 70 07/01/22 1030  Resp 17 07/01/22 1030  SpO2 92 % 07/01/22 1030  Vitals shown include unvalidated device data.  Last Pain:  Vitals:   07/01/22 0600  TempSrc: Oral  PainSc:          Complications: No notable events documented.

## 2022-07-02 DIAGNOSIS — S72009D Fracture of unspecified part of neck of unspecified femur, subsequent encounter for closed fracture with routine healing: Secondary | ICD-10-CM | POA: Diagnosis not present

## 2022-07-02 NOTE — Progress Notes (Signed)
Pt foley removed per order.  Pt given peri care before and after removal.  Pt didn't have any issues or complaints.  Pt is due to void @1230 .

## 2022-07-02 NOTE — Evaluation (Signed)
Physical Therapy Evaluation Patient Details Name: Shannon Martin MRN: [id number] DOB: 09/18/1940 Today's Date: 07/02/2022  History of Present Illness  Pt is an 82 y.o. female who presented 06/30/22 s/p fall in which pt sustained a L hip fx. S/p L THA posterior approach. PMH: asthma, HTN, dementia, HLD, afib on Eliquis   Clinical Impression  Pt presents with condition above and deficits mentioned below, see PT Problem List. PTA, she was independent without DME, living alone in a 1-level apartment with 1 short STE. Pt has a hx of dementia and no family was present during this session so it is unclear if pt is currently at her baseline cognitive status, but she was disoriented to location and displayed STM deficits along with restless and impulsive tendencies. These factors along with her deficits in balance and activity tolerance place her at high risk for subsequent falls. She is not managing the RW safely, needing repeated cues to remain proximal and within the RW when ambulating. Pt would be unsafe to be home alone, thus if pt can obtain frequent/constant supervision/assistance upon d/c then she could go home with HHPT follow-up. The pt reports this may not be possible, thus recommending rehab at a SNF. Will continue to follow acutely.     Recommendations for follow up therapy are one component of a multi-disciplinary discharge planning process, led by the attending physician.  Recommendations may be updated based on patient status, additional functional criteria and insurance authorization.  Follow Up Recommendations Skilled nursing-short term rehab (<3 hours/day) (vs HHPT if can get 24/7 care) Can patient physically be transported by private vehicle: Yes    Assistance Recommended at Discharge Frequent or constant Supervision/Assistance  Patient can return home with the following  A little help with walking and/or transfers;A little help with bathing/dressing/bathroom;Assistance with  cooking/housework;Direct supervision/assist for medications management;Direct supervision/assist for financial management;Assist for transportation;Help with stairs or ramp for entrance    Equipment Recommendations Rolling walker (2 wheels);BSC/3in1  Recommendations for Other Services       Functional Status Assessment Patient has had a recent decline in their functional status and demonstrates the ability to make significant improvements in function in a reasonable and predictable amount of time.     Precautions / Restrictions Precautions Precautions: Fall Restrictions Weight Bearing Restrictions: Yes LLE Weight Bearing: Weight bearing as tolerated Other Position/Activity Restrictions: no formal hip precautions per Dr. Blanchie Dessert 07/02/22      Mobility  Bed Mobility Overal bed mobility: Needs Assistance Bed Mobility: Supine to Sit     Supine to sit: Supervision, HOB elevated     General bed mobility comments: Pt initially trying to sit up L EOB with L bed rail still up even though bed rail on R had been dropped and PT was on her R. Needed cues to redirect to sit up on R EOB. Extra time and effort, supervision for safety    Transfers Overall transfer level: Needs assistance Equipment used: Rolling walker (2 wheels) Transfers: Sit to/from Stand Sit to Stand: Min guard, Min assist           General transfer comment: Min guard-minA for stability coming to stand from EOB 1x and from toilet 1x.    Ambulation/Gait Ambulation/Gait assistance: Min assist (mod cues) Gait Distance (Feet): 20 Feet (x2 bouts of ~20 ft each) Assistive device: Rolling walker (2 wheels) Gait Pattern/deviations: Step-to pattern, Decreased stance time - left, Decreased step length - right, Decreased stride length, Trunk flexed, Antalgic Gait velocity: reduced Gait velocity interpretation: <  1.31 ft/sec, indicative of household ambulator   General Gait Details: Pt with slow, antalgic step-to gait  pattern due to L hip pain. MinA for stability throughout and repeated moderate cues needed to remain proximal to and within RW.  Stairs            Wheelchair Mobility    Modified Rankin (Stroke Patients Only)       Balance Overall balance assessment: Needs assistance Sitting-balance support: No upper extremity supported, Feet supported Sitting balance-Leahy Scale: Good Sitting balance - Comments: Can reach down to toes to donn socks while sitting EOB   Standing balance support: Bilateral upper extremity supported, Single extremity supported, During functional activity, Reliant on assistive device for balance Standing balance-Leahy Scale: Poor Standing balance comment: Reliant on UE support                             Pertinent Vitals/Pain Pain Assessment Pain Assessment: Faces Faces Pain Scale: Hurts even more Pain Location: L hip Pain Descriptors / Indicators: Discomfort, Grimacing, Guarding, Operative site guarding, Moaning Pain Intervention(s): Limited activity within patient's tolerance, Monitored during session, Premedicated before session, Repositioned    Home Living Family/patient expects to be discharged to:: Private residence Living Arrangements: Alone Available Help at Discharge: Family;Available PRN/intermittently;Neighbor Type of Home: Apartment Home Access: Stairs to enter Entrance Stairs-Rails:  (post) Entrance Stairs-Number of Steps: 1 (short)   Home Layout: One level Home Equipment: Cane - quad;Cane - single point;Grab bars - toilet;Grab bars - tub/shower      Prior Function Prior Level of Function : Independent/Modified Independent;Driving             Mobility Comments: Does not use an AD. This was the only fall in the past 6 months per pt. ADLs Comments: She no longer drives at night.     Hand Dominance        Extremity/Trunk Assessment   Upper Extremity Assessment Upper Extremity Assessment: Defer to OT evaluation     Lower Extremity Assessment Lower Extremity Assessment: LLE deficits/detail LLE Deficits / Details: Pain limiting AROM of hip, but strength WFL    Cervical / Trunk Assessment Cervical / Trunk Assessment: Kyphotic  Communication   Communication: No difficulties  Cognition Arousal/Alertness: Awake/alert Behavior During Therapy: Restless, Impulsive Overall Cognitive Status: History of cognitive impairments - at baseline                                 General Comments: Pt has a hx of dementia per chart review. She was oriented to self, current month and year, and situation but stated she was "in one of your houses" and "it seems you all live here but also work out of your house". Pt asking if PT would return later even though PT just stated PT would not return and the nursing staff would help her back to bed later. Pt restless, repeatedly folding and unfolding blankets. Impulsive and needs cues to direct or remain on task also. No family present to confirm if this is pt's baseline.        General Comments      Exercises     Assessment/Plan    PT Assessment Patient needs continued PT services  PT Problem List Decreased range of motion;Decreased activity tolerance;Decreased balance;Decreased mobility;Decreased cognition;Decreased knowledge of use of DME;Pain       PT Treatment Interventions DME instruction;Gait training;Stair training;Functional  mobility training;Therapeutic activities;Therapeutic exercise;Balance training;Neuromuscular re-education;Cognitive remediation;Patient/family education    PT Goals (Current goals can be found in the Care Plan section)  Acute Rehab PT Goals Patient Stated Goal: to go to [facility] or home PT Goal Formulation: With patient Time For Goal Achievement: 07/16/22 Potential to Achieve Goals: Good    Frequency Min 4X/week     Co-evaluation               AM-PAC PT "6 Clicks" Mobility  Outcome Measure Help needed turning  from your back to your side while in a flat bed without using bedrails?: A Little Help needed moving from lying on your back to sitting on the side of a flat bed without using bedrails?: A Little Help needed moving to and from a bed to a chair (including a wheelchair)?: A Little Help needed standing up from a chair using your arms (e.g., wheelchair or bedside chair)?: A Little Help needed to walk in hospital room?: A Lot (mod cues) Help needed climbing 3-5 steps with a railing? : Total 6 Click Score: 15    End of Session   Activity Tolerance: Patient tolerated treatment well Patient left: in chair;with call bell/phone within reach;with chair alarm set Nurse Communication: Mobility status PT Visit Diagnosis: Unsteadiness on feet (R26.81);Other abnormalities of gait and mobility (R26.89);Difficulty in walking, not elsewhere classified (R26.2);Pain Pain - Right/Left: Left Pain - part of body: Hip    Time: 7124-5809 PT Time Calculation (min) (ACUTE ONLY): 29 min   Charges:   PT Evaluation $PT Eval Moderate Complexity: 1 Mod PT Treatments $Gait Training: 8-22 mins        Moishe Spice, PT, DPT Acute Rehabilitation Services  Office: [phone]   Orvan Falconer 07/02/2022, 1:39 PM

## 2022-07-02 NOTE — Progress Notes (Signed)
PROGRESS NOTE  Shannon Martin NWG:[id number] DOB: 1940/11/19 DOA: 06/30/2022 PCP: Rochel Brome, MD   LOS: 2 days   Brief Narrative / Interim history: 82 year old female with history of asthma, HTN, dementia, HLD, PAF on Eliquis who comes into the hospital after a fall.  She went to the bathroom upon waking up around midnight, tripped and fell.  She was found to have a left hip fracture and was admitted to the hospital.  Orthopedic surgery was consulted  Subjective / 24h Interval events: Doing well, mild soreness but no significant pain.  Assesement and Plan: Principal Problem:   Hip fracture ([place]) Active Problems:   Moderate persistent asthma   Mixed hyperlipidemia   PAF (paroxysmal atrial fibrillation) (HCC)   Anxiety   Urinary retention with incomplete bladder emptying   Principal problem Left hip fracture-following ground-level fall, orthopedic surgery consulted, she was taken to the OR this morning 07/01/2022 and is status post left THA posterior approach -DVT prophylaxis per orthopedic surgery postop, resume Eliquis today -PT noted today, recommends SNF, placement pending  Active problems Essential hypertension-does not appear to be taking medications for this, monitor blood pressure while hospitalized.  Continue to monitor, blood pressure acceptable for now  Hyperlipidemia-continue statin  PAF-resume Eliquis  Anxiety-continue as needed alprazolam  Asthma-stable, no wheezing, continue inhalers as below  Urinary retention-continue tamsulosin, mirabegron   Scheduled Meds:  apixaban  2.5 mg Oral BID   [START ON 07/04/2022] apixaban  5 mg Oral BID   aspirin EC  81 mg Oral Daily   atorvastatin  40 mg Oral Daily   dicyclomine  20 mg Oral TID AC & HS   docusate sodium  100 mg Oral BID   famotidine  40 mg Oral BID   feeding supplement  237 mL Oral Q24H   fluticasone  1 spray Each Nare Daily   lactose free nutrition  237 mL Oral Q24H   loratadine  10 mg Oral Daily    mirabegron ER  25 mg Oral Daily   mometasone-formoterol  2 puff Inhalation BID   tamsulosin  0.4 mg Oral Daily   Continuous Infusions:  methocarbamol (ROBAXIN) IV     PRN Meds:.acetaminophen, albuterol, ALPRAZolam, bisacodyl, methocarbamol **OR** methocarbamol (ROBAXIN) IV, morphine injection, ondansetron (ZOFRAN) IV, oxyCODONE, polyethylene glycol, polyvinyl alcohol  Current Outpatient Medications  Medication Instructions   albuterol (VENTOLIN HFA) 108 (90 Base) MCG/ACT inhaler Inhale two puffs every four to six hours as needed for cough or wheeze.   ALPRAZolam (XANAX) 0.25 mg, Oral, 2 times daily PRN   apixaban (ELIQUIS) 5 mg, Oral, 2 times daily   ascorbic acid (VITAMIN C) 1,000 mg, Oral, Daily   aspirin EC 81 mg, Oral, Daily   atorvastatin (LIPITOR) 40 mg, Oral, Daily   B Complex-C-Calcium (B-COMPLEX/VITAMIN C, W/ CA, PO) Oral   budesonide-formoterol (SYMBICORT) 80-4.5 MCG/ACT inhaler INHALE 2 PUFFS INTO LUNGS TWICE DAILY, IN THE MORNING AND EVENING   Calcium Carb-Cholecalciferol (CALCIUM + D3) 600-200 MG-UNIT TABS 1 tablet, Oral, 2 times daily   celecoxib (CELEBREX) 100 MG capsule TAKE 1 CAPSULE(100 MG) BY MOUTH TWICE DAILY   D3-1000 1,000 Units, Oral, Daily   dicyclomine (BENTYL) 20 MG tablet TAKE ONE TABLET BY MOUTH FOUR TIMES DAILY BEFORE MEALS AND AT BEDTIME   estradiol (ESTRACE) 0.1 MG/GM vaginal cream 1 Applicatorful, Vaginal, Daily at bedtime   famotidine (PEPCID) 40 MG tablet TAKE 1 TABLET(40 MG) BY MOUTH TWICE DAILY   fluticasone (FLONASE) 50 MCG/ACT nasal spray Can use one spray in  each nostril once daily as directed.   GEMTESA 75 MG TABS 1 tablet, Oral, Daily   hydrocortisone (ANUSOL-HC) 2.5 % rectal cream 1 Application, Rectal, 2 times daily   Hypromellose (ARTIFICIAL TEARS OP) 1 drop, Ophthalmic, As needed   loratadine (CLARITIN) 10 mg, Oral, Daily   Multiple Vitamins-Minerals (HAIR SKIN & NAILS PO) Oral   Multiple Vitamins-Minerals (MULTIVITAMIN PO) 1 tablet, Oral,  Daily   tamsulosin (FLOMAX) 0.4 mg, Oral, Daily   VITAMIN E PO 400 Units, Oral, 2 times daily    Diet Orders (From admission, onward)     Start     Ordered   07/01/22 1118  Diet regular Room service appropriate? Yes; Fluid consistency: Thin  Diet effective now       Question Answer Comment  Room service appropriate? Yes   Fluid consistency: Thin      07/01/22 1117            DVT prophylaxis: apixaban (ELIQUIS) tablet 2.5 mg Start: 07/02/22 1000 SCDs Start: 06/30/22 1303 apixaban (ELIQUIS) tablet 2.5 mg  apixaban (ELIQUIS) tablet 5 mg   Lab Results  Component Value Date   PLT 197 07/01/2022      Code Status: Full Code  Family Communication: no family at bedside   Status is: Inpatient  Remains inpatient appropriate because: SNF pending  Level of care: Med-Surg  Consultants:  Orthopedic surgery   Objective: Vitals:   07/01/22 2115 07/02/22 0105 07/02/22 0527 07/02/22 0900  BP: 137/64 (!) 108/92 (!) 141/60 132/61  Pulse: 84 83 77 77  Resp: 20 16  20   Temp: 98.1 F (36.7 C) 98.3 F (36.8 C) 98.4 F (36.9 C) 98.3 F (36.8 C)  TempSrc: Oral Oral Oral Oral  SpO2: 99% 98% 98% 97%  Weight:      Height:        Intake/Output Summary (Last 24 hours) at 07/02/2022 1455 Last data filed at 07/02/2022 0630 Gross per 24 hour  Intake 0 ml  Output 1075 ml  Net -1075 ml    Wt Readings from Last 3 Encounters:  07/01/22 66.7 kg  06/14/22 66.7 kg  05/24/22 65.8 kg    Examination:  Constitutional: NAD Eyes: lids and conjunctivae normal, no scleral icterus ENMT: mmm Neck: normal, supple Respiratory: clear to auscultation bilaterally, no wheezing, no crackles. Normal respiratory effort.  Cardiovascular: Regular rate and rhythm, no murmurs / rubs / gallops. No LE edema. Abdomen: soft, no distention, no tenderness. Bowel sounds positive.  Skin: no rashes  Data Reviewed: I have independently reviewed following labs and imaging studies   CBC Recent Labs  Lab  06/30/22 0902 07/01/22 0331  WBC 11.4* 7.8  HGB 12.3 10.4*  HCT 35.5* 29.5*  PLT 251 197  MCV 90.6 88.6  MCH 31.4 31.2  MCHC 34.6 35.3  RDW 12.5 12.7     Recent Labs  Lab 06/30/22 0902 07/01/22 0331  NA 138 137  K 3.5 3.7  CL 104 105  CO2 22 23  GLUCOSE 118* 114*  BUN 19 21  CREATININE 0.96 0.99  CALCIUM 9.5 8.8*  AST 30  --   ALT 21  --   ALKPHOS 85  --   BILITOT 0.5  --   ALBUMIN 3.8  --   LATICACIDVEN 2.6*  --      ------------------------------------------------------------------------------------------------------------------ No results for input(s): "CHOL", "HDL", "LDLCALC", "TRIG", "CHOLHDL", "LDLDIRECT" in the last 72 hours.  No results found for: "HGBA1C" ------------------------------------------------------------------------------------------------------------------ No results for input(s): "TSH", "T4TOTAL", "T3FREE", "THYROIDAB" in  the last 72 hours.  Invalid input(s): "FREET3"  Cardiac Enzymes No results for input(s): "CKMB", "TROPONINI", "MYOGLOBIN" in the last 168 hours.  Invalid input(s): "CK" ------------------------------------------------------------------------------------------------------------------ No results found for: "BNP"  CBG: No results for input(s): "GLUCAP" in the last 168 hours.  Recent Results (from the past 240 hour(s))  Surgical pcr screen     Status: None   Collection Time: 07/01/22  4:17 AM   Specimen: Nasal Mucosa; Nasal Swab  Result Value Ref Range Status   MRSA, PCR NEGATIVE NEGATIVE Final   Staphylococcus aureus NEGATIVE NEGATIVE Final    Comment: (NOTE) The Xpert SA Assay (FDA approved for NASAL specimens in patients 66 years of age and older), is one component of a comprehensive surveillance program. It is not intended to diagnose infection nor to guide or monitor treatment. Performed at [facility], [address], [place], [place] [address]      Radiology Studies: No results found.   Pamella Pert, MD, PhD [facility]  Between 7 am - 7 pm I am available, please contact me via Amion (for emergencies) or Securechat (non urgent messages)  Between 7 pm - 7 am I am not available, please contact night coverage MD/APP via Amion

## 2022-07-02 NOTE — Plan of Care (Signed)
  Problem: Education: Goal: Knowledge of General Education information will improve Description: Including pain rating scale, medication(s)/side effects and non-pharmacologic comfort measures Outcome: Not Progressing   Problem: Health Behavior/Discharge Planning: Goal: Ability to manage health-related needs will improve Outcome: Not Progressing   Problem: Clinical Measurements: Goal: Ability to maintain clinical measurements within normal limits will improve Outcome: Not Progressing Goal: Will remain free from infection Outcome: Not Progressing Goal: Diagnostic test results will improve Outcome: Not Progressing Goal: Respiratory complications will improve Outcome: Not Progressing Goal: Cardiovascular complication will be avoided Outcome: Not Progressing   Problem: Activity: Goal: Risk for activity intolerance will decrease Outcome: Not Progressing   Problem: Nutrition: Goal: Adequate nutrition will be maintained Outcome: Not Progressing   Problem: Coping: Goal: Level of anxiety will decrease Outcome: Not Progressing   Problem: Elimination: Goal: Will not experience complications related to bowel motility Outcome: Not Progressing Goal: Will not experience complications related to urinary retention Outcome: Not Progressing   Problem: Pain Managment: Goal: General experience of comfort will improve Outcome: Not Progressing   Problem: Safety: Goal: Ability to remain free from injury will improve Outcome: Not Progressing   Problem: Skin Integrity: Goal: Risk for impaired skin integrity will decrease Outcome: Not Progressing   Problem: Education: Goal: Knowledge of General Education information will improve Description: Including pain rating scale, medication(s)/side effects and non-pharmacologic comfort measures Outcome: Not Progressing   Problem: Health Behavior/Discharge Planning: Goal: Ability to manage health-related needs will improve Outcome: Not  Progressing   Problem: Clinical Measurements: Goal: Ability to maintain clinical measurements within normal limits will improve Outcome: Not Progressing Goal: Will remain free from infection Outcome: Not Progressing Goal: Diagnostic test results will improve Outcome: Not Progressing Goal: Respiratory complications will improve Outcome: Not Progressing Goal: Cardiovascular complication will be avoided Outcome: Not Progressing   Problem: Activity: Goal: Risk for activity intolerance will decrease Outcome: Not Progressing   Problem: Nutrition: Goal: Adequate nutrition will be maintained Outcome: Not Progressing   Problem: Coping: Goal: Level of anxiety will decrease Outcome: Not Progressing   Problem: Elimination: Goal: Will not experience complications related to bowel motility Outcome: Not Progressing Goal: Will not experience complications related to urinary retention Outcome: Not Progressing   Problem: Pain Managment: Goal: General experience of comfort will improve Outcome: Not Progressing   Problem: Safety: Goal: Ability to remain free from injury will improve Outcome: Not Progressing   Problem: Skin Integrity: Goal: Risk for impaired skin integrity will decrease Outcome: Not Progressing   

## 2022-07-02 NOTE — Plan of Care (Signed)

## 2022-07-02 NOTE — TOC Initial Note (Signed)
Transition of Care [facility]) - Initial/Assessment Note    Patient Details  Name: Shannon Martin MRN: [id number] Date of Birth: 07-03-1940  [facility]) CM/SW Contact:    Loreta [address], [place] Phone Number: 07/02/2022, 4:28 PM  Clinical Narrative:                 CSW spoke with pt's daughter Hal Morales concerning dc plans. Clayborne Dana states she will be out of the country next week and believes SNF would be the best option so pt is not home alone. Clayborne Dana states pt has been to [facility] and would like to return there. CSW provided information on Medicare.gov ratings list and insurance auth process, all other questions answered. TOC will continue to follow.         Patient Goals and CMS Choice            Expected Discharge Plan and Services                                              Prior Living Arrangements/Services                       Activities of Daily Living      Permission Sought/Granted                  Emotional Assessment              Admission diagnosis:  Hip fracture ([place]) [S72.009A] Closed fracture of left hip, initial encounter ([place]) [S72.002A] Patient Active Problem List   Diagnosis Date Noted   Hip fracture ([place]) 06/30/2022   Urinary retention with incomplete bladder emptying 06/30/2022   Internal hemorrhoid, bleeding 05/24/2022   Anxiety 05/16/2022   Fecal soiling 05/16/2022   IBS (irritable bowel syndrome) 03/22/2022   Proctitis 02/15/2022   Memory changes 09/16/2021   PAF (paroxysmal atrial fibrillation) ([place]) 05/04/2021   Overactive bladder 04/05/2021   Seborrheic dermatitis 12/21/2020   Chronic diarrhea 03/09/2020   BMI 27.0-27.9,adult 11/26/2019   Migraine without aura and without status migrainosus, not intractable 07/28/2019   Generalized osteoarthritis 07/28/2019   Mixed hyperlipidemia 12/11/2018   Essential hypertension 12/11/2018   Iliotibial band syndrome of left side 06/17/2015   Moderate  persistent asthma 03/24/2015   Laryngopharyngeal reflux (LPR) 03/24/2015   GERD (gastroesophageal reflux disease) 03/08/2015   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   Allergic rhinitis 02/16/2015   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   Primary osteoarthritis of left knee 12/03/2014   Left knee pain 10/01/2014   PCP:  Rochel Brome, MD Pharmacy:   [facility] [place], [place] - [address] [place] [place] [address] Phone: [phone] Fax: [phone]  [facility] - [place], [place] - [address] [place] [address] Phone: [phone] Fax: [phone]     Social Determinants of Health (SDOH) Social History: SDOH Screenings   Food Insecurity: No Food Insecurity (10/11/2021)  Housing: Low Risk  (10/11/2021)  Transportation Needs: No Transportation Needs (03/14/2022)  Alcohol Screen: Low Risk  (10/11/2021)  Depression (PHQ2-9): Low Risk  (03/28/2022)  Financial Resource Strain: Low Risk  (03/14/2022)  Physical Activity: Insufficiently Active (10/11/2021)  Social Connections: Unknown (10/11/2021)  Stress: No Stress Concern Present (10/11/2021)  Tobacco Use: Low Risk  (  07/01/2022)   SDOH Interventions:     Readmission Risk Interventions     No data to display

## 2022-07-02 NOTE — Progress Notes (Signed)
     Subjective:  Patient reports pain as mild.  Reports she did not sleep well overnight, but at baseline has trouble sleeping.  Denies distal numbness and tingling.  Eager to work with therapy today.  Objective:   VITALS:   Vitals:   07/01/22 1124 07/01/22 2115 07/02/22 0105 07/02/22 0527  BP: (!) 123/55 137/64 (!) 108/92 (!) 141/60  Pulse: 62 84 83 77  Resp: 14 20 16    Temp: 98.2 F (36.8 C) 98.1 F (36.7 C) 98.3 F (36.8 C) 98.4 F (36.9 C)  TempSrc: Oral Oral Oral Oral  SpO2: 90% 99% 98% 98%  Weight:      Height:        Sensation intact distally Intact pulses distally Dorsiflexion/Plantar flexion intact Incision: dressing C/D/I Compartment soft   Lab Results  Component Value Date   WBC 7.8 07/01/2022   HGB 10.4 (L) 07/01/2022   HCT 29.5 (L) 07/01/2022   MCV 88.6 07/01/2022   PLT 197 07/01/2022   BMET    Component Value Date/Time   NA 137 07/01/2022 0331   NA 138 03/21/2022 0927   K 3.7 07/01/2022 0331   CL 105 07/01/2022 0331   CO2 23 07/01/2022 0331   GLUCOSE 114 (H) 07/01/2022 0331   BUN 21 07/01/2022 0331   BUN 19 03/21/2022 0927   CREATININE 0.99 07/01/2022 0331   CALCIUM 8.8 (L) 07/01/2022 0331   EGFR 51 (L) 03/21/2022 0927   GFRNONAA 58 (L) 07/01/2022 0331      Xray: X-rays left hip demonstrate total of arthroplasty components good position no adverse features  Assessment/Plan: 1 Day Post-Op   Principal Problem:   Hip fracture (HCC) Active Problems:   Moderate persistent asthma   Mixed hyperlipidemia   PAF (paroxysmal atrial fibrillation) (HCC)   Anxiety   Urinary retention with incomplete bladder emptying  Status post left total hip arthroplasty for femoral neck fracture 07/01/22 Post op recs: WB: WBAT LLE, No formal hip precautions Abx: Vancomycin given history of penicillin swelling reaction Imaging: PACU pelvis Xray Dressing: Aquacell, keep intact until follow up DVT prophylaxis: Eliquis 2.5 mg twice daily starting postop  day 1 and 2, resume Eliquis 5 mg starting postop day 3 Follow up: 2 weeks after surgery for a wound check with Dr. Zachery Dakins at [facility].  Address: [address] [place], [place], [place] [address]  Office Phone: [phone]      Willaim Sheng 07/02/2022, 9:37 AM   Charlies Constable, MD  Contact information:   [phone] 7am-5pm epic message Dr. Zachery Dakins, or call office for patient follow up: [phone] After hours and holidays please check [email or web address] for group call information for [facility]

## 2022-07-03 ENCOUNTER — Encounter (HOSPITAL_COMMUNITY): Payer: Self-pay | Admitting: Orthopedic Surgery

## 2022-07-03 DIAGNOSIS — F413 Other mixed anxiety disorders: Secondary | ICD-10-CM | POA: Diagnosis not present

## 2022-07-03 DIAGNOSIS — G8918 Other acute postprocedural pain: Secondary | ICD-10-CM | POA: Diagnosis not present

## 2022-07-03 DIAGNOSIS — D649 Anemia, unspecified: Secondary | ICD-10-CM | POA: Diagnosis not present

## 2022-07-03 DIAGNOSIS — S72009D Fracture of unspecified part of neck of unspecified femur, subsequent encounter for closed fracture with routine healing: Secondary | ICD-10-CM | POA: Diagnosis not present

## 2022-07-03 DIAGNOSIS — R262 Difficulty in walking, not elsewhere classified: Secondary | ICD-10-CM | POA: Diagnosis not present

## 2022-07-03 DIAGNOSIS — F4320 Adjustment disorder, unspecified: Secondary | ICD-10-CM | POA: Diagnosis not present

## 2022-07-03 DIAGNOSIS — S72002D Fracture of unspecified part of neck of left femur, subsequent encounter for closed fracture with routine healing: Secondary | ICD-10-CM | POA: Diagnosis not present

## 2022-07-03 DIAGNOSIS — F03A4 Unspecified dementia, mild, with anxiety: Secondary | ICD-10-CM | POA: Diagnosis not present

## 2022-07-03 LAB — BASIC METABOLIC PANEL
Anion gap: 8 (ref 5–15)
BUN: 24 mg/dL — ABNORMAL HIGH (ref 8–23)
CO2: 26 mmol/L (ref 22–32)
Calcium: 8.8 mg/dL — ABNORMAL LOW (ref 8.9–10.3)
Chloride: 101 mmol/L (ref 98–111)
Creatinine, Ser: 1.03 mg/dL — ABNORMAL HIGH (ref 0.44–1.00)
GFR, Estimated: 55 mL/min — ABNORMAL LOW (ref >60)
Glucose, Bld: 114 mg/dL — ABNORMAL HIGH (ref 70–99)
Potassium: 3.7 mmol/L (ref 3.5–5.1)
Sodium: 135 mmol/L (ref 135–145)

## 2022-07-03 LAB — MAGNESIUM: Magnesium: 1.7 mg/dL (ref 1.7–2.4)

## 2022-07-03 LAB — CBC
HCT: 27.1 % — ABNORMAL LOW (ref 36.0–46.0)
Hemoglobin: 9.1 g/dL — ABNORMAL LOW (ref 12.0–15.0)
MCH: 30.3 pg (ref 26.0–34.0)
MCHC: 33.6 g/dL (ref 30.0–36.0)
MCV: 90.3 fL (ref 80.0–100.0)
Platelets: 219 10*3/uL (ref 150–400)
RBC: 3 MIL/uL — ABNORMAL LOW (ref 3.87–5.11)
RDW: 12.4 % (ref 11.5–15.5)
WBC: 10.1 10*3/uL (ref 4.0–10.5)
nRBC: 0 % (ref 0.0–0.2)

## 2022-07-03 LAB — GLUCOSE, CAPILLARY: Glucose-Capillary: 154 mg/dL — ABNORMAL HIGH (ref 70–99)

## 2022-07-03 MED ORDER — APIXABAN 2.5 MG PO TABS: 2.5000 mg | ORAL_TABLET | Freq: Two times a day (BID) | ORAL | Status: DC

## 2022-07-03 MED ORDER — APIXABAN 5 MG PO TABS: 5.0000 mg | ORAL_TABLET | Freq: Two times a day (BID) | ORAL | 1 refills | Status: DC

## 2022-07-03 MED ORDER — ALPRAZOLAM 0.25 MG PO TABS: 0.2500 mg | ORAL_TABLET | Freq: Two times a day (BID) | ORAL | 3 refills | Status: DC | PRN

## 2022-07-03 MED ORDER — OXYCODONE HCL 5 MG PO TABS: 2.5000 mg | ORAL_TABLET | ORAL | 0 refills | Status: DC | PRN

## 2022-07-03 NOTE — Care Management Important Message (Signed)
Important Message  Patient Details  Name: Shannon Martin MRN: [id number] Date of Birth: 1941/02/15   Medicare Important Message Given:  Yes    Patient left prior to IM delivery will mail copy to the patients home address.   [name] 07/03/2022, 3:53 PM

## 2022-07-03 NOTE — TOC Progression Note (Addendum)
Transition of Care [facility]) - Progression Note    Patient Details  Name: Shannon Martin MRN: [id number] Date of Birth: 12-Sep-1940  [facility] CM/SW Contact  Joanne Chars, LCSW Phone Number: 07/03/2022, 10:37 AM  Clinical Narrative:   Pt listed as oriented x4, CSW attempted to speak with her regarding bed offers but pt appears confused, says she needs an uber to [place] "and then I will be able to pay them."    CSW spoke with daughter Nunzio Cory, who says pt has been somewhat confused last day or so.  Nunzio Cory wants to accept offer at [facility].  Confirmed [facility] [place] with [facility].  Auth request submitted in [place] and approved: [id number]: 3 days: 1/8-1/10..    2951: CSW spoke with daughter about providing transportation--she is able to do this, will be at the hospital in about 30 minutes. RN informed.     Expected Discharge Plan: Skilled Nursing Facility Barriers to Discharge: Continued Medical Work up  Expected Discharge Plan and Services In-house Referral: Clinical Social Work     Living arrangements for the past 2 months: Apartment                                       Social Determinants of Health (SDOH) Interventions SDOH Screenings   Food Insecurity: No Food Insecurity (10/11/2021)  Housing: Low Risk  (10/11/2021)  Transportation Needs: No Transportation Needs (03/14/2022)  Alcohol Screen: Low Risk  (10/11/2021)  Depression (PHQ2-9): Low Risk  (03/28/2022)  Financial Resource Strain: Low Risk  (03/14/2022)  Physical Activity: Insufficiently Active (10/11/2021)  Social Connections: Unknown (10/11/2021)  Stress: No Stress Concern Present (10/11/2021)  Tobacco Use: Low Risk  (07/01/2022)    Readmission Risk Interventions     No data to display

## 2022-07-03 NOTE — Progress Notes (Signed)
Physical Therapy Treatment Patient Details Name: Shannon Martin MRN: [id number] DOB: [birth year] Today's Date: 07/03/2022   History of Present Illness Pt is an 81 y.o. female who presented 06/30/22 s/p fall in which pt sustained a L hip fx. S/p L THA posterior approach. PMH: asthma, HTN, dementia, HLD, afib on Eliquis    PT Comments    Pt received in bed. RN reports she got up from chair earlier today and fell onto bottom on R side. Pt reports no pain and no dysfunction noted with gait. Pt progressing from a mobility standpoint but continues to remain very unsafe due to cognitive deficits. Pt ambulated 100' with RW but does not attend to environment and ran into stationary objects in hallway needing min A to redirect. Continue to recommend SNF at d/c. PT will continue to follow.    Recommendations for follow up therapy are one component of a multi-disciplinary discharge planning process, led by the attending physician.  Recommendations may be updated based on patient status, additional functional criteria and insurance authorization.  Follow Up Recommendations  Skilled nursing-short term rehab (<3 hours/day) Can patient physically be transported by private vehicle: Yes   Assistance Recommended at Discharge Frequent or constant Supervision/Assistance  Patient can return home with the following A little help with walking and/or transfers;A little help with bathing/dressing/bathroom;Assistance with cooking/housework;Direct supervision/assist for medications management;Direct supervision/assist for financial management;Assist for transportation;Help with stairs or ramp for entrance   Equipment Recommendations  Rolling walker (2 wheels);BSC/3in1    Recommendations for Other Services       Precautions / Restrictions Precautions Precautions: Fall Restrictions Weight Bearing Restrictions: Yes LLE Weight Bearing: Weight bearing as tolerated Other Position/Activity Restrictions: no formal hip  precautions per Dr. Blanchie Dessert 07/02/22     Mobility  Bed Mobility Overal bed mobility: Needs Assistance Bed Mobility: Supine to Sit     Supine to sit: Supervision     General bed mobility comments: no physical assist needed to come to EOB, increased time needed    Transfers Overall transfer level: Needs assistance Equipment used: Rolling walker (2 wheels) Transfers: Sit to/from Stand Sit to Stand: Supervision           General transfer comment: supervision for safety    Ambulation/Gait Ambulation/Gait assistance: Min guard, Min assist Gait Distance (Feet): 100 Feet Assistive device: Rolling walker (2 wheels) Gait Pattern/deviations: Step-to pattern, Decreased stance time - left, Decreased step length - right, Decreased stride length, Trunk flexed, Antalgic Gait velocity: reduced Gait velocity interpretation: 1.31 - 2.62 ft/sec, indicative of limited community ambulator   General Gait Details: pt moving well today, min guard except at times when pt not attending to obstacles, min A to redirect.   Stairs             Wheelchair Mobility    Modified Rankin (Stroke Patients Only)       Balance Overall balance assessment: Needs assistance Sitting-balance support: No upper extremity supported, Feet supported Sitting balance-Leahy Scale: Good Sitting balance - Comments: pt able to reach down to feet to get shoes on   Standing balance support: Bilateral upper extremity supported, Single extremity supported, During functional activity, Reliant on assistive device for balance Standing balance-Leahy Scale: Poor Standing balance comment: Reliant on UE support                            Cognition Arousal/Alertness: Awake/alert Behavior During Therapy: Restless, Impulsive Overall Cognitive Status: History of cognitive impairments -  at baseline                                 General Comments: pt pleasantly confused throughout with  tangential conversation        Exercises Total Joint Exercises Ankle Circles/Pumps: AROM, Both, 10 reps, Seated Heel Slides: AROM, Left, 10 reps, Supine Hip ABduction/ADduction: AROM, Left, 10 reps, Supine Straight Leg Raises: AAROM, Left, 10 reps, Supine Long Arc Quad: AROM, Left, 10 reps, Seated Bridges: AROM, 10 reps, Supine    General Comments General comments (skin integrity, edema, etc.): VSS, daughter present end of session      Pertinent Vitals/Pain Pain Assessment Pain Assessment: Faces Faces Pain Scale: Hurts little more Pain Location: L hip Pain Descriptors / Indicators: Grimacing, Operative site guarding, Discomfort Pain Intervention(s): Limited activity within patient's tolerance, Monitored during session    Home Living Family/patient expects to be discharged to:: Skilled nursing facility Living Arrangements: Other (Comment)                      Prior Function            PT Goals (current goals can now be found in the care plan section) Acute Rehab PT Goals Patient Stated Goal: to go to [facility] or home PT Goal Formulation: With patient Time For Goal Achievement: 07/16/22 Potential to Achieve Goals: Good Progress towards PT goals: Progressing toward goals    Frequency    Min 4X/week      PT Plan Current plan remains appropriate    Co-evaluation              AM-PAC PT "6 Clicks" Mobility   Outcome Measure  Help needed turning from your back to your side while in a flat bed without using bedrails?: A Little Help needed moving from lying on your back to sitting on the side of a flat bed without using bedrails?: A Little Help needed moving to and from a bed to a chair (including a wheelchair)?: A Little Help needed standing up from a chair using your arms (e.g., wheelchair or bedside chair)?: A Little Help needed to walk in hospital room?: A Little Help needed climbing 3-5 steps with a railing? : A Lot 6 Click Score: 17    End of  Session Equipment Utilized During Treatment: Gait belt Activity Tolerance: Patient tolerated treatment well Patient left: with call bell/phone within reach;in bed;with bed alarm set;with family/visitor present Nurse Communication: Mobility status PT Visit Diagnosis: Unsteadiness on feet (R26.81);Other abnormalities of gait and mobility (R26.89);Difficulty in walking, not elsewhere classified (R26.2);Pain Pain - Right/Left: Left Pain - part of body: Hip     Time: 7564-3329 PT Time Calculation (min) (ACUTE ONLY): 18 min  Charges:  $Gait Training: 8-22 mins                     Tice chat preferred Office [place] 07/03/2022, 1:45 PM

## 2022-07-03 NOTE — Progress Notes (Signed)
     Subjective: Patient worked with PT yesterday. Did well. ambulated 20 feet. Eager to do more. Denies distal n/t. Pain well controlled.  Objective:   VITALS:   Vitals:   07/02/22 0527 07/02/22 0900 07/02/22 2025 07/02/22 2034  BP: (!) 141/60 132/61  112/70  Pulse: 77 77  (!) 104  Resp:  20    Temp: 98.4 F (36.9 C) 98.3 F (36.8 C)    TempSrc: Oral Oral  Oral  SpO2: 98% 97% 96%   Weight:      Height:        Sensation intact distally Intact pulses distally Dorsiflexion/Plantar flexion intact Incision: dressing C/D/I Compartment soft   Lab Results  Component Value Date   WBC 10.1 07/03/2022   HGB 9.1 (L) 07/03/2022   HCT 27.1 (L) 07/03/2022   MCV 90.3 07/03/2022   PLT 219 07/03/2022   BMET    Component Value Date/Time   NA 135 07/03/2022 0336   NA 138 03/21/2022 0927   K 3.7 07/03/2022 0336   CL 101 07/03/2022 0336   CO2 26 07/03/2022 0336   GLUCOSE 114 (H) 07/03/2022 0336   BUN 24 (H) 07/03/2022 0336   BUN 19 03/21/2022 0927   CREATININE 1.03 (H) 07/03/2022 0336   CALCIUM 8.8 (L) 07/03/2022 0336   EGFR 51 (L) 03/21/2022 0927   GFRNONAA 55 (L) 07/03/2022 0336      Xray: X-rays left hip demonstrate total of arthroplasty components good position no adverse features  Assessment/Plan: 2 Days Post-Op   Principal Problem:   Hip fracture (HCC) Active Problems:   Moderate persistent asthma   Mixed hyperlipidemia   PAF (paroxysmal atrial fibrillation) (HCC)   Anxiety   Urinary retention with incomplete bladder emptying  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 Zachery Dakins at [facility].  Address: [address] [place], [place], [place] [address]  Office Phone: [phone]      Willaim Sheng 07/03/2022, 7:04 AM   Charlies Constable, MD  Contact information:   [phone] 7am-5pm epic message Dr. Zachery Dakins, or call office for patient follow up: [phone] After hours and holidays please check [email or web address] for group call information for [facility]

## 2022-07-03 NOTE — Discharge Summary (Signed)
Physician Discharge Summary  Shannon Martin OZD:[id number] DOB: 1940/08/31 DOA: 06/30/2022  PCP: Blane Ohara, MD  Admit date: 06/30/2022 Discharge date: 07/03/2022  Admitted From: home Disposition:  SNF  Recommendations for Outpatient Follow-up:  Follow up with PCP in 1-2 weeks Follow up with Orthopedic surgery in 1 week   Home Health: none Equipment/Devices: none  Discharge Condition: stable CODE STATUS: Full code Diet Orders (From admission, onward)     Start     Ordered   07/01/22 1118  Diet regular Room service appropriate? Yes; Fluid consistency: Thin  Diet effective now       Question Answer Comment  Room service appropriate? Yes   Fluid consistency: Thin      07/01/22 1117            HPI: Per admitting MD, Shannon Martin is a 82 y.o. female with medical history significant of asthma; HTN; dementia; HLD; and afib on Eliquis presenting with a fall.  She reports that she awoke about MN, has chronic bowel/bladder problems.  She went into the bathroom and reached around to turn on the light.  She caught her shoe and fell.  She hit her left head on the sink and floor but does not have a headache.  She then landed directly on the left hip.  She takes Eliquis once daily in the morning so her last dose was yesterday AM   Hospital Course / Discharge diagnoses: Principal Problem:   Hip fracture [facility]) Active Problems:   Moderate persistent asthma   Mixed hyperlipidemia   PAF (paroxysmal atrial fibrillation) (HCC)   Anxiety   Urinary retention with incomplete bladder emptying   Principal problem Left hip fracture-following ground-level fall, orthopedic surgery consulted, she was taken to the OR this morning 07/01/2022 and is status post left THA posterior approach. DVT prophylaxis per orthopedic surgery postop, recommending Eliquis 2.5 mg BID for 2 days, last dose 1/18 in the evening, and starting 1/9 to resume her prior home dose of Eliquis 5 mg BID.  She will need follow-up  with orthopedic surgery as an outpatient   Active problems Hyperlipidemia-continue statin PAF-resume Eliquis Anxiety-continue as needed alprazolam Asthma-stable, no wheezing, continue inhalers as below Urinary retention-continue tamsulosin, mirabegron  Sepsis ruled out   Discharge Instructions   Allergies as of 07/03/2022       Reactions   Tetanus Toxoids Swelling, Other (See Comments)   Swelling around throat and jaws   Bee Venom Swelling, Other (See Comments)   Crusty area on skin   Hornet Venom Swelling   Crusty area on skin   Dilaudid [hydromorphone] Nausea And Vomiting   Causes nausea and vomiting   Erythromycin Nausea And Vomiting   Fiorinal [butalbital-aspirin-caffeine]    Altered mental status   Amoxicillin Other (See Comments)   dizzy   Doxycycline Other (See Comments)   dizzy   Latex Other (See Comments)   Skin will crack open and bleed   Neomycin Other (See Comments)   Irritates skin   Penicillins Itching, Swelling, Rash        Medication List     STOP taking these medications    celecoxib 100 MG capsule Commonly known as: CELEBREX       TAKE these medications    albuterol 108 (90 Base) MCG/ACT inhaler Commonly known as: VENTOLIN HFA Inhale two puffs every four to six hours as needed for cough or wheeze.   ALPRAZolam 0.25 MG tablet Commonly known as: XANAX Take 1 tablet (0.25  mg total) by mouth 2 (two) times daily as needed for anxiety.   apixaban 2.5 MG Tabs tablet Commonly known as: ELIQUIS Take 1 tablet (2.5 mg total) by mouth 2 (two) times daily for 1 dose. What changed: You were already taking a medication with the same name, and this prescription was added. Make sure you understand how and when to take each.   apixaban 5 MG Tabs tablet Commonly known as: ELIQUIS Take 1 tablet (5 mg total) by mouth 2 (two) times daily. Start taking on: July 04, 2022 What changed: Another medication with the same name was added. Make sure you  understand how and when to take each.   ARTIFICIAL TEARS OP Apply 1 drop to eye as needed for dry eyes.   ascorbic acid 1000 MG tablet Commonly known as: VITAMIN C Take 1,000 mg by mouth daily.   aspirin EC 81 MG tablet Take 81 mg by mouth daily.   atorvastatin 40 MG tablet Commonly known as: LIPITOR Take 1 tablet (40 mg total) by mouth daily.   B-COMPLEX/VITAMIN C (W/ CA) PO Take by mouth.   budesonide-formoterol 80-4.5 MCG/ACT inhaler Commonly known as: Symbicort INHALE 2 PUFFS INTO LUNGS TWICE DAILY, IN THE MORNING AND EVENING   Calcium + D3 600-200 MG-UNIT Tabs Take 1 tablet by mouth 2 (two) times daily.   D3-1000 25 MCG (1000 UT) capsule Generic drug: Cholecalciferol Take 1,000 Units by mouth daily.   dicyclomine 20 MG tablet Commonly known as: BENTYL TAKE ONE TABLET BY MOUTH FOUR TIMES DAILY BEFORE MEALS AND AT BEDTIME What changed: See the new instructions.   estradiol 0.1 MG/GM vaginal cream Commonly known as: ESTRACE Place 1 Applicatorful vaginally at bedtime.   famotidine 40 MG tablet Commonly known as: PEPCID TAKE 1 TABLET(40 MG) BY MOUTH TWICE DAILY   fluticasone 50 MCG/ACT nasal spray Commonly known as: FLONASE Can use one spray in each nostril once daily as directed.   Gemtesa 75 MG Tabs Generic drug: Vibegron Take 1 tablet by mouth daily.   HAIR SKIN & NAILS PO Take by mouth.   hydrocortisone 2.5 % rectal cream Commonly known as: ANUSOL-HC Place 1 Application rectally 2 (two) times daily.   loratadine 10 MG tablet Commonly known as: CLARITIN Take 10 mg by mouth daily.   MULTIVITAMIN PO Take 1 tablet by mouth daily.   oxyCODONE 5 MG immediate release tablet Commonly known as: Oxy IR/ROXICODONE Take 0.5-1 tablets (2.5-5 mg total) by mouth every 4 (four) hours as needed for severe pain or moderate pain.   tamsulosin 0.4 MG Caps capsule Commonly known as: FLOMAX Take 0.4 mg by mouth daily.   VITAMIN E PO Take 400 Units by mouth 2  (two) times daily.        Follow-up Information     Joen Laura, MD Follow up in 2 week(s).   Specialty: Orthopedic Surgery Contact information: [address] [place] [address] [place] [address] [phone]                Consultations: Orthopedic surgery   Procedures/Studies:  DG HIP UNILAT W OR W/O PELVIS 2-3 VIEWS LEFT  Result Date: 07/01/2022 CLINICAL DATA:  Postop, left hip arthroplasty. EXAM: DG HIP (WITH OR WITHOUT PELVIS) 2-3V LEFT COMPARISON:  None Available. FINDINGS: Postsurgical changes for left hip arthroplasty. No periprosthetic fracture. Emphysema about the prosthesis as expected. IMPRESSION: Postsurgical changes for left hip arthroplasty. Electronically Signed   By: Larose Hires D.O.   On: 07/01/2022 13:46  DG HIP UNILAT WITH PELVIS 1V LEFT  Result Date: 07/01/2022 CLINICAL DATA:  Intraoperative x-ray EXAM: DG HIP (WITH OR WITHOUT PELVIS) 1V*L* COMPARISON:  06/30/2022 FINDINGS: Single intraoperative frontal radiograph of the left hip demonstrates total hip arthroplasty without evidence of perihardware fracture or component malposition. Overlying retracting instruments. IMPRESSION: Single intraoperative frontal radiograph of the left hip demonstrates total hip arthroplasty without evidence of perihardware fracture or component malposition. Electronically Signed   By: Jearld Lesch M.D.   On: 07/01/2022 10:30   DG FEMUR PORT MIN 2 VIEWS LEFT  Result Date: 06/30/2022 CLINICAL DATA:  Fall.  Left hip pain.  Left hip fracture. EXAM: LEFT FEMUR PORTABLE 2 VIEWS COMPARISON:  Hip radiographs same date. FINDINGS: The bones are mildly demineralized. A mildly displaced fracture of the mid left femoral neck is again noted, better seen on dedicated hip radiographs. No distal femoral fracture is identified. Patient is status post left total knee arthroplasty. There is no hardware loosening or significant knee joint effusion. Probable small calcified uterine fibroids.  IMPRESSION: Mildly displaced fracture of the left femoral neck. No distal femoral fracture identified. Electronically Signed   By: Carey Bullocks M.D.   On: 06/30/2022 11:24   DG Humerus Right  Result Date: 06/30/2022 CLINICAL DATA:  Recent fall EXAM: RIGHT HUMERUS - 2+ VIEW COMPARISON:  None Available. FINDINGS: There is no evidence of acute fracture. Alignment is normal. There is mild glenohumeral and AC joint osteoarthritis. IMPRESSION: Negative right humerus radiographs. Electronically Signed   By: Caprice Renshaw M.D.   On: 06/30/2022 11:24   DG Hip Unilat With Pelvis 2-3 Views Left  Result Date: 06/30/2022 CLINICAL DATA:  Pain post fall EXAM: DG HIP (WITH OR WITHOUT PELVIS) 2-3V LEFT COMPARISON:  12/23/2021 FINDINGS: Impacted mid cervical fracture of the left femur. No dislocation. Bony pelvis intact. 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 Aortic Atherosclerosis (ICD10-170.0). IMPRESSION: Impacted mid cervical left femoral fracture. Electronically Signed   By: Corlis Leak M.D.   On: 06/30/2022 10:08   DG Chest 1 View  Result Date: 06/30/2022 CLINICAL DATA:  Pain after fall EXAM: CHEST  1 VIEW COMPARISON:  May 04, 2021 FINDINGS: Possible sclerosis in the proximal right humerus, not present on the comparison chest x-ray. Visualized bones are otherwise unremarkable. The heart, hila, mediastinum, lungs, and pleura are otherwise unremarkable. IMPRESSION: 1. Possible sclerosis in the proximal right humerus, not present on the comparison chest x-ray. Recommend dedicated imaging of the right humerus. 2. No other abnormalities. Electronically Signed   By: Gerome Sam III M.D.   On: 06/30/2022 10:07   CT HEAD WO CONTRAST  Result Date: 06/30/2022 CLINICAL DATA:  Fall.  Patient is chronically anticoagulated. EXAM: CT HEAD WITHOUT CONTRAST TECHNIQUE: Contiguous axial images were obtained from the base of the skull through the vertex without intravenous contrast. RADIATION DOSE  REDUCTION: This exam was performed according to the departmental dose-optimization program which includes automated exposure control, adjustment of the mA and/or kV according to patient size and/or use of iterative reconstruction technique. COMPARISON:  08/27/2019 FINDINGS: Brain: There is no evidence for acute hemorrhage, hydrocephalus, mass lesion, or abnormal extra-axial fluid collection. No definite CT evidence for acute infarction. Diffuse loss of parenchymal volume is consistent with atrophy. Vascular: Choose 1 Skull: No evidence for fracture. No worrisome lytic or sclerotic lesion. Sinuses/Orbits: Mucosal thickening with air-fluid level noted left maxillary sinus with areas of mucosal thickening noted in the ethmoid air cells and opacification of the hypoplastic left frontal sinus. No evidence  for mastoid effusion. Visualized portions of the globes and intraorbital fat are unremarkable. Other: None. IMPRESSION: 1. No acute intracranial abnormality. 2. Mild atrophy. 3. 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 Electronically Signed   By: Misty Stanley M.D.   On: 06/30/2022 09:33    Subjective: - no chest pain, shortness of breath, no abdominal pain, nausea or vomiting.   Discharge Exam: BP (!) 152/83 (BP Location: Right Arm)   Pulse 88   Temp 98.9 F (37.2 C) (Oral)   Resp 15   Ht 4' 11.02" (1.499 m)   Wt 66.7 kg   SpO2 96%   BMI 29.67 kg/m   General: Pt is alert, awake, not in acute distress Cardiovascular: RRR, S1/S2 +, no rubs, no gallops Respiratory: CTA bilaterally, no wheezing, no rhonchi Abdominal: Soft, NT, ND, bowel sounds +  The results of significant diagnostics from this hospitalization (including imaging, microbiology, ancillary and laboratory) are listed below for reference.     Microbiology: Recent Results (from the past 240 hour(s))  Surgical pcr screen     Status: None   Collection Time: 07/01/22  4:17 AM   Specimen: Nasal Mucosa; Nasal Swab  Result Value Ref Range  Status   MRSA, PCR NEGATIVE NEGATIVE Final   Staphylococcus aureus NEGATIVE NEGATIVE Final    Comment: (NOTE) The Xpert SA Assay (FDA approved for NASAL specimens in patients 8 years of age and older), is one component of a comprehensive surveillance program. It is not intended to diagnose infection nor to guide or monitor treatment. Performed at [facility], [place] [address] [place], [place] [address]      Labs: Basic Metabolic Panel: Recent Labs  Lab 06/30/22 0902 07/01/22 0331 07/03/22 0336  NA 138 137 135  K 3.5 3.7 3.7  CL 104 105 101  CO2 22 23 26   GLUCOSE 118* 114* 114*  BUN 19 21 24*  CREATININE 0.96 0.99 1.03*  CALCIUM 9.5 8.8* 8.8*  MG  --   --  1.7   Liver Function Tests: Recent Labs  Lab 06/30/22 0902  AST 30  ALT 21  ALKPHOS 85  BILITOT 0.5  PROT 6.5  ALBUMIN 3.8   CBC: Recent Labs  Lab 06/30/22 0902 07/01/22 0331 07/03/22 0336  WBC 11.4* 7.8 10.1  HGB 12.3 10.4* 9.1*  HCT 35.5* 29.5* 27.1*  MCV 90.6 88.6 90.3  PLT 251 197 219   CBG: No results for input(s): "GLUCAP" in the last 168 hours. Hgb A1c No results for input(s): "HGBA1C" in the last 72 hours. Lipid Profile No results for input(s): "CHOL", "HDL", "LDLCALC", "TRIG", "CHOLHDL", "LDLDIRECT" in the last 72 hours. Thyroid function studies No results for input(s): "TSH", "T4TOTAL", "T3FREE", "THYROIDAB" in the last 72 hours.  Invalid input(s): "FREET3" Urinalysis    Component Value Date/Time   COLORURINE YELLOW 01/01/2015 [place] 01/01/2015 1322   LABSPEC 1.015 01/01/2015 1322   PHURINE 5.0 01/01/2015 1322   GLUCOSEU NEGATIVE 01/01/2015 1322   HGBUR NEGATIVE 01/01/2015 1322   BILIRUBINUR NEGATIVE 01/01/2015 1322   KETONESUR NEGATIVE 01/01/2015 1322   PROTEINUR NEGATIVE 01/01/2015 1322   UROBILINOGEN 0.2 01/01/2015 1322   NITRITE NEGATIVE 01/01/2015 1322   LEUKOCYTESUR NEGATIVE 01/01/2015 1322    FURTHER DISCHARGE INSTRUCTIONS:   Get Medicines  reviewed and adjusted: Please take all your medications with you for your next visit with your Primary MD   Laboratory/radiological data: Please request your Primary MD to go over all hospital tests and procedure/radiological results at  the follow up, please ask your Primary MD to get all Hospital records sent to his/her office.   In some cases, they will be blood work, cultures and biopsy results pending at the time of your discharge. Please request that your primary care M.D. goes through all the records of your hospital data and follows up on these results.   Also Note the following: If you experience worsening of your admission symptoms, develop shortness of breath, life threatening emergency, suicidal or homicidal thoughts you must seek medical attention immediately by calling 911 or calling your MD immediately  if symptoms less severe.   You must read complete instructions/literature along with all the possible adverse reactions/side effects for all the Medicines you take and that have been prescribed to you. Take any new Medicines after you have completely understood and accpet all the possible adverse reactions/side effects.    Do not drive when taking Pain medications or sleeping medications (Benzodaizepines)   Do not take more than prescribed Pain, Sleep and Anxiety Medications. It is not advisable to combine anxiety,sleep and pain medications without talking with your primary care practitioner   Special Instructions: If you have smoked or chewed Tobacco  in the last 2 yrs please stop smoking, stop any regular Alcohol  and or any Recreational drug use.   Wear Seat belts while driving.   Please note: You were cared for by a hospitalist during your hospital stay. Once you are discharged, your primary care physician will handle any further medical issues. Please note that NO REFILLS for any discharge medications will be authorized once you are discharged, as it is imperative that you  return to your primary care physician (or establish a relationship with a primary care physician if you do not have one) for your post hospital discharge needs so that they can reassess your need for medications and monitor your lab values.  Time coordinating discharge: 35 minutes  SIGNED:  Pamella Pert, MD, PhD 07/03/2022, 11:20 AM

## 2022-07-03 NOTE — Progress Notes (Signed)
OT Cancellation Note  Patient Details Name: Shannon Martin MRN: [id number] DOB: February 13, 1941   Cancelled Treatment:    Reason Eval/Treat Not Completed:  (Pt discharging to SNF today, will defer OT eval to SNF.)  Malka So 07/03/2022, 12:20 PM Cleta Alberts, OTR/L [place] Office: [phone]

## 2022-07-03 NOTE — NC FL2 (Addendum)
[place] LEVEL OF CARE FORM     IDENTIFICATION  Patient Name: Shannon Martin Birthdate: Dec 04, 1940 Sex: female Admission Date (Current Location): 06/30/2022  [facility] and [place] Number:  Herbalist and Address:  [facility], [place] [address], [place],  [address]      Provider Number: [id number]  Attending Physician Name and Address:  Caren Griffins, MD  Relative Name and Phone Number:  Kathee Polite Daughter   [phone]    Current Level of Care: Hospital Recommended Level of Care: [place] Prior Approval Number:   Date Approved/Denied:   PASRR Number:  [id number]  Discharge Plan: SNF    Current Diagnoses: Patient Active Problem List   Diagnosis Date Noted   Hip fracture ([place]) 06/30/2022   Urinary retention with incomplete bladder emptying 06/30/2022   Internal hemorrhoid, bleeding 05/24/2022   Anxiety 05/16/2022   Fecal soiling 05/16/2022   IBS (irritable bowel syndrome) 03/22/2022   Proctitis 02/15/2022   Memory changes 09/16/2021   PAF (paroxysmal atrial fibrillation) ([place]) 05/04/2021   Overactive bladder 04/05/2021   Seborrheic dermatitis 12/21/2020   Chronic diarrhea 03/09/2020   BMI 27.0-27.9,adult 11/26/2019   Migraine without aura and without status migrainosus, not intractable 07/28/2019   Generalized osteoarthritis 07/28/2019   Mixed hyperlipidemia 12/11/2018   Essential hypertension 12/11/2018   Iliotibial band syndrome of left side 06/17/2015   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   Laryngopharyngeal reflux (LPR) 03/24/2015   GERD (gastroesophageal reflux disease) 03/08/2015   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   Allergic rhinitis 02/16/2015   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   Primary osteoarthritis of left knee 12/03/2014   Left knee pain 10/01/2014    Orientation RESPIRATION BLADDER Height & Weight     Self, Time, Situation, Place  O2 Incontinent, External  catheter Weight: 147 lb (66.7 kg) Height:  4' 11.02" (149.9 cm)  BEHAVIORAL SYMPTOMS/MOOD NEUROLOGICAL BOWEL NUTRITION STATUS      Continent Diet (see discharge summary)  AMBULATORY STATUS COMMUNICATION OF NEEDS Skin   Extensive Assist Verbally Surgical wounds                       Personal Care Assistance Level of Assistance  Bathing, Feeding, Dressing Bathing Assistance: Limited assistance Feeding assistance: Independent Dressing Assistance: Limited assistance     Functional Limitations Info  Sight, Hearing, Speech Sight Info: Impaired Hearing Info: Adequate Speech Info: Adequate    SPECIAL CARE FACTORS FREQUENCY  PT (By licensed PT), OT (By licensed OT)     PT Frequency: 5x week OT Frequency: 5x week            Contractures Contractures Info: Not present    Additional Factors Info  Code Status, Allergies Code Status Info: full Allergies Info: Tetanus Toxoids, Bee Venom, Hornet Venom, Dilaudid (Hydromorphone), Erythromycin, Fiorinal (Butalbital-aspirin-caffeine), Amoxicillin, Doxycycline, Latex, Neomycin, Penicillins           Current Medications (07/03/2022):  This is the current hospital active medication list Current Facility-Administered Medications  Medication Dose Route Frequency Provider Last Rate Last Admin   acetaminophen (TYLENOL) tablet 650 mg  650 mg Oral Q6H PRN Willaim Sheng, MD   650 mg at 07/02/22 2028   albuterol (PROVENTIL) (2.5 MG/3ML) 0.083% nebulizer solution 2.5 mg  2.5 mg Nebulization Q4H PRN Willaim Sheng, MD       ALPRAZolam Duanne Moron) tablet 0.25 mg  0.25 mg Oral BID PRN Willaim Sheng, MD   0.25 mg  at 07/02/22 1838   apixaban (ELIQUIS) tablet 2.5 mg  2.5 mg Oral BID Joen Laura, MD   2.5 mg at 07/03/22 0842   [START ON 07/04/2022] apixaban (ELIQUIS) tablet 5 mg  5 mg Oral BID Joen Laura, MD       aspirin EC tablet 81 mg  81 mg Oral Daily Joen Laura, MD   81 mg at 07/03/22 0843    atorvastatin (LIPITOR) tablet 40 mg  40 mg Oral Daily Joen Laura, MD   40 mg at 07/03/22 0843   bisacodyl (DULCOLAX) EC tablet 5 mg  5 mg Oral Daily PRN Joen Laura, MD       dicyclomine (BENTYL) tablet 20 mg  20 mg Oral TID AC & HS Joen Laura, MD   20 mg at 07/03/22 0845   docusate sodium (COLACE) capsule 100 mg  100 mg Oral BID Joen Laura, MD   100 mg at 07/03/22 0842   famotidine (PEPCID) tablet 40 mg  40 mg Oral BID Joen Laura, MD   40 mg at 07/03/22 0842   feeding supplement (ENSURE ENLIVE / ENSURE PLUS) liquid 237 mL  237 mL Oral Q24H Leatha Gilding, MD   237 mL at 07/02/22 1519   fluticasone (FLONASE) 50 MCG/ACT nasal spray 1 spray  1 spray Each Nare Daily Joen Laura, MD   1 spray at 07/02/22 0902   lactose free nutrition (BOOST PLUS) liquid 237 mL  237 mL Oral Q24H Leatha Gilding, MD   237 mL at 07/03/22 0845   loratadine (CLARITIN) tablet 10 mg  10 mg Oral Daily Joen Laura, MD   10 mg at 07/03/22 0272   methocarbamol (ROBAXIN) tablet 500 mg  500 mg Oral Q6H PRN Joen Laura, MD   500 mg at 07/02/22 2028   Or   methocarbamol (ROBAXIN) 500 mg in dextrose 5 % 50 mL IVPB  500 mg Intravenous Q6H PRN Joen Laura, MD       mirabegron ER (MYRBETRIQ) tablet 25 mg  25 mg Oral Daily Joen Laura, MD   25 mg at 07/03/22 0845   mometasone-formoterol (DULERA) 100-5 MCG/ACT inhaler 2 puff  2 puff Inhalation BID Joen Laura, MD   2 puff at 07/02/22 2029   morphine (PF) 2 MG/ML injection 2 mg  2 mg Intravenous Q2H PRN Joen Laura, MD       ondansetron [facility]) injection 4 mg  4 mg Intravenous Q6H PRN Joen Laura, MD       oxyCODONE (Oxy IR/ROXICODONE) immediate release tablet 2.5-5 mg  2.5-5 mg Oral Q4H PRN Joen Laura, MD   5 mg at 07/02/22 1654   polyethylene glycol (MIRALAX / GLYCOLAX) packet 17 g  17 g Oral Daily PRN Joen Laura, MD       polyvinyl alcohol  (LIQUIFILM TEARS) 1.4 % ophthalmic solution 1 drop  1 drop Both Eyes PRN Joen Laura, MD       tamsulosin (FLOMAX) capsule 0.4 mg  0.4 mg Oral Daily Joen Laura, MD   0.4 mg at 07/03/22 5366     Discharge Medications: Please see discharge summary for a list of discharge medications.  Relevant Imaging Results:  Relevant Lab Results:   Additional Information SSN: [id number]  Lorri Frederick, LCSW

## 2022-07-03 NOTE — TOC Transition Note (Signed)
Transition of Care [facility]) - CM/SW Discharge Note   Patient Details  Name: Shannon Martin MRN: [id number] Date of Birth: 05-06-1941  [facility] CM/SW Contact:  Joanne Chars, LCSW Phone Number: 07/03/2022, 11:50 AM   Clinical Narrative:   Pt discharging to [facility].  RN call report to [phone].  Pt daughter Juliann Pulse will transport pt, will need assistance with bringing pt down to her vehicle at main entrance and getting her into the car.     Final next level of care: [place] Barriers to Discharge: Barriers Resolved   Patient Goals and CMS Choice      Discharge Placement                Patient chooses bed at:  ([facility]) Patient to be transferred to facility by: daughter Juliann Pulse Name of family member notified: daughter Juliann Pulse Patient and family notified of of transfer: 07/03/22  Discharge Plan and Services Additional resources added to the After Visit Summary for   In-house Referral: Clinical Social Work                                   Social Determinants of Health (SDOH) Interventions SDOH Screenings   Food Insecurity: No Food Insecurity (10/11/2021)  Housing: Low Risk  (10/11/2021)  Transportation Needs: No Transportation Needs (03/14/2022)  Alcohol Screen: Low Risk  (10/11/2021)  Depression (PHQ2-9): Low Risk  (03/28/2022)  Financial Resource Strain: Low Risk  (03/14/2022)  Physical Activity: Insufficiently Active (10/11/2021)  Social Connections: Unknown (10/11/2021)  Stress: No Stress Concern Present (10/11/2021)  Tobacco Use: Low Risk  (07/01/2022)     Readmission Risk Interventions     No data to display

## 2022-07-03 NOTE — Plan of Care (Signed)
Pt A&OX 2 to 3 at times. BP (!) 132/56 (BP Location: Left Arm)   Pulse 96   Temp 98.9 F (37.2 C) (Oral)   Resp 15   Ht 4' 11.02" (1.499 m)   Wt 66.7 kg   SpO2 96%   BMI 29.67 kg/m  Pt to d/c to SNF. Report will be called IV removed. Pt transported by family to SNF. Louanne Skye 07/03/22 12:04 PM

## 2022-07-04 ENCOUNTER — Telehealth: Payer: Self-pay

## 2022-07-04 DIAGNOSIS — R262 Difficulty in walking, not elsewhere classified: Secondary | ICD-10-CM | POA: Diagnosis not present

## 2022-07-04 DIAGNOSIS — S72002D Fracture of unspecified part of neck of left femur, subsequent encounter for closed fracture with routine healing: Secondary | ICD-10-CM | POA: Diagnosis not present

## 2022-07-04 DIAGNOSIS — G8918 Other acute postprocedural pain: Secondary | ICD-10-CM | POA: Diagnosis not present

## 2022-07-04 DIAGNOSIS — D649 Anemia, unspecified: Secondary | ICD-10-CM | POA: Diagnosis not present

## 2022-07-04 NOTE — Progress Notes (Signed)
Care Management & Coordination Services Pharmacy Team  Reason for Encounter: Medication coordination and delivery  Contacted patient on 07/04/2022 to discuss medications   Recent office visits:  06-14-2022 Abigail Miyamoto, MD. Visit for anxiety.  Recent consult visits:  06-30-2022-07-03-2022 Leatha Gilding, MD. Total hip arthroplasty procedure. START oxycodone 5 mg take 0.5-1 tablet every 4 hours PRN. STOP celebrex.  Hospital visits:  None in previous 6 months  Medications: Outpatient Encounter Medications as of 07/04/2022  Medication Sig   albuterol (VENTOLIN HFA) 108 (90 Base) MCG/ACT inhaler Inhale two puffs every four to six hours as needed for cough or wheeze.   ALPRAZolam (XANAX) 0.25 MG tablet Take 1 tablet (0.25 mg total) by mouth 2 (two) times daily as needed for anxiety.   apixaban (ELIQUIS) 2.5 MG TABS tablet Take 1 tablet (2.5 mg total) by mouth 2 (two) times daily for 1 dose.   apixaban (ELIQUIS) 5 MG TABS tablet Take 1 tablet (5 mg total) by mouth 2 (two) times daily.   ascorbic acid (VITAMIN C) 1000 MG tablet Take 1,000 mg by mouth daily.   aspirin EC 81 MG tablet Take 81 mg by mouth daily.   atorvastatin (LIPITOR) 40 MG tablet Take 1 tablet (40 mg total) by mouth daily.   B Complex-C-Calcium (B-COMPLEX/VITAMIN C, W/ CA, PO) Take by mouth.   budesonide-formoterol (SYMBICORT) 80-4.5 MCG/ACT inhaler INHALE 2 PUFFS INTO LUNGS TWICE DAILY, IN THE MORNING AND EVENING   Calcium Carb-Cholecalciferol (CALCIUM + D3) 600-200 MG-UNIT TABS Take 1 tablet by mouth 2 (two) times daily.   Cholecalciferol (D3-1000) 25 MCG (1000 UT) capsule Take 1,000 Units by mouth daily.   dicyclomine (BENTYL) 20 MG tablet TAKE ONE TABLET BY MOUTH FOUR TIMES DAILY BEFORE MEALS AND AT BEDTIME (Patient taking differently: Take 20 mg by mouth in the morning, at noon, in the evening, and at bedtime.)   estradiol (ESTRACE) 0.1 MG/GM vaginal cream Place 1 Applicatorful vaginally at bedtime.    famotidine (PEPCID) 40 MG tablet TAKE 1 TABLET(40 MG) BY MOUTH TWICE DAILY   fluticasone (FLONASE) 50 MCG/ACT nasal spray Can use one spray in each nostril once daily as directed.   GEMTESA 75 MG TABS Take 1 tablet by mouth daily.   hydrocortisone (ANUSOL-HC) 2.5 % rectal cream Place 1 Application rectally 2 (two) times daily.   Hypromellose (ARTIFICIAL TEARS OP) Apply 1 drop to eye as needed for dry eyes.   loratadine (CLARITIN) 10 MG tablet Take 10 mg by mouth daily.   Multiple Vitamins-Minerals (HAIR SKIN & NAILS PO) Take by mouth.   Multiple Vitamins-Minerals (MULTIVITAMIN PO) Take 1 tablet by mouth daily.   oxyCODONE (OXY IR/ROXICODONE) 5 MG immediate release tablet Take 0.5-1 tablets (2.5-5 mg total) by mouth every 4 (four) hours as needed for severe pain or moderate pain.   tamsulosin (FLOMAX) 0.4 MG CAPS capsule Take 0.4 mg by mouth daily.   VITAMIN E PO Take 400 Units by mouth 2 (two) times daily.   No facility-administered encounter medications on file as of 07/04/2022.   BP Readings from Last 3 Encounters:  07/03/22 (!) 132/56  06/14/22 (!) 140/78  05/24/22 (!) 144/86    Pulse Readings from Last 3 Encounters:  07/03/22 96  06/14/22 85  05/24/22 (!) 53    No results found for: "HGBA1C" Lab Results  Component Value Date   CREATININE 1.03 (H) 07/03/2022   BUN 24 (H) 07/03/2022   GFRNONAA 55 (L) 07/03/2022   GFRAA 59 (L) 03/09/2020  NA 135 07/03/2022   K 3.7 07/03/2022   CALCIUM 8.8 (L) 07/03/2022   CO2 26 07/03/2022     Last adherence delivery date: 06-15-2022  Patient is due for next adherence delivery on: 07-14-2022  Spoke with patient on 07-04-2022 and reviewed medications. Patient denied the need for any medications at this time.  This delivery to include: Vials  30 Days  Budesonide-formoterol 80-4.57mcg- INHALE 2 PUFFS INTO LUNGS TWICE DAILY, IN THE MORNING AND EVENING   Celebrex 100 mg twice daily Atorvastatin 40mg  1 daily   Famotidine 40mg  twice daily   ASA 81mg  1 daily  Womens MVI 1 daily  Vitamin E 1 daily  Vitamin D 1 daily  Calcium-D 1 daily  Dicyclomine 20mg -1 tab 4x daily        Gemtesa 75mg  daily Eliquis 5mg  twice daily   07-04-2022: Patient is currently admitted into rehab and will call [facility] once discharged. Chasity aware and form sent.   Shannon Martin

## 2022-07-18 DIAGNOSIS — I480 Paroxysmal atrial fibrillation: Secondary | ICD-10-CM | POA: Diagnosis not present

## 2022-07-18 DIAGNOSIS — M80052D Age-related osteoporosis with current pathological fracture, left femur, subsequent encounter for fracture with routine healing: Secondary | ICD-10-CM | POA: Diagnosis not present

## 2022-07-18 DIAGNOSIS — F0394 Unspecified dementia, unspecified severity, with anxiety: Secondary | ICD-10-CM | POA: Diagnosis not present

## 2022-07-18 DIAGNOSIS — G8918 Other acute postprocedural pain: Secondary | ICD-10-CM | POA: Diagnosis not present

## 2022-07-18 DIAGNOSIS — I10 Essential (primary) hypertension: Secondary | ICD-10-CM | POA: Diagnosis not present

## 2022-07-18 DIAGNOSIS — M25552 Pain in left hip: Secondary | ICD-10-CM | POA: Diagnosis not present

## 2022-07-18 DIAGNOSIS — E782 Mixed hyperlipidemia: Secondary | ICD-10-CM | POA: Diagnosis not present

## 2022-07-18 DIAGNOSIS — J4540 Moderate persistent asthma, uncomplicated: Secondary | ICD-10-CM | POA: Diagnosis not present

## 2022-07-18 DIAGNOSIS — R339 Retention of urine, unspecified: Secondary | ICD-10-CM | POA: Diagnosis not present

## 2022-07-18 DIAGNOSIS — I700 Atherosclerosis of aorta: Secondary | ICD-10-CM | POA: Diagnosis not present

## 2022-07-21 DIAGNOSIS — E782 Mixed hyperlipidemia: Secondary | ICD-10-CM | POA: Diagnosis not present

## 2022-07-21 DIAGNOSIS — I480 Paroxysmal atrial fibrillation: Secondary | ICD-10-CM | POA: Diagnosis not present

## 2022-07-21 DIAGNOSIS — M80052D Age-related osteoporosis with current pathological fracture, left femur, subsequent encounter for fracture with routine healing: Secondary | ICD-10-CM | POA: Diagnosis not present

## 2022-07-21 DIAGNOSIS — F0394 Unspecified dementia, unspecified severity, with anxiety: Secondary | ICD-10-CM | POA: Diagnosis not present

## 2022-07-21 DIAGNOSIS — J4540 Moderate persistent asthma, uncomplicated: Secondary | ICD-10-CM | POA: Diagnosis not present

## 2022-07-21 DIAGNOSIS — R339 Retention of urine, unspecified: Secondary | ICD-10-CM | POA: Diagnosis not present

## 2022-07-21 DIAGNOSIS — I700 Atherosclerosis of aorta: Secondary | ICD-10-CM | POA: Diagnosis not present

## 2022-07-21 DIAGNOSIS — I10 Essential (primary) hypertension: Secondary | ICD-10-CM | POA: Diagnosis not present

## 2022-07-21 DIAGNOSIS — G8918 Other acute postprocedural pain: Secondary | ICD-10-CM | POA: Diagnosis not present

## 2022-07-25 ENCOUNTER — Encounter: Payer: Self-pay | Admitting: Family Medicine

## 2022-07-25 ENCOUNTER — Ambulatory Visit (INDEPENDENT_AMBULATORY_CARE_PROVIDER_SITE_OTHER): Payer: Medicare HMO | Admitting: Family Medicine

## 2022-07-25 VITALS — BP 148/62 | HR 77 | Temp 97.8°F | Ht 59.6 in | Wt 141.0 lb

## 2022-07-25 DIAGNOSIS — I10 Essential (primary) hypertension: Secondary | ICD-10-CM

## 2022-07-25 DIAGNOSIS — E782 Mixed hyperlipidemia: Secondary | ICD-10-CM

## 2022-07-25 DIAGNOSIS — R413 Other amnesia: Secondary | ICD-10-CM | POA: Diagnosis not present

## 2022-07-25 DIAGNOSIS — Z8781 Personal history of (healed) traumatic fracture: Secondary | ICD-10-CM

## 2022-07-25 NOTE — Progress Notes (Signed)
Subjective:  Patient ID: Shannon Martin, female    DOB: [birth year]  Age: 82 y.o. MRN: [id number]  Chief Complaint  Patient presents with   other    [facility] Follow up    HPI Patient presents from [facility] follow up. Patient had a fall which resulted in a left hip fracture in early January. Tripped in the dark.   Patient fell on January 5 in the middle the night and was taken to the emergency department and found to have a left fractured hip.  Patient had surgery done by Charyl Dancer.  Discharged from the hospital to [facility] on January 8.  Patient returned to Daughter's home on January 22.  Home health physical therapy and occupational therapy.  Has discussed with Education officer, museum at [employer] [employer].   Her daughter is very concerned about her memory. Her memory is worsening. Her daughter feels she needs to be in an assisted living.      03/28/2022    9:07 AM 10/11/2021    9:28 AM 10/04/2021   10:39 AM 09/16/2021    8:44 AM 03/18/2021    9:02 AM  Depression screen PHQ 2/9  Decreased Interest 0 0 0 0 0  Down, Depressed, Hopeless 0 0 0 0 0  PHQ - 2 Score 0 0 0 0 0  Altered sleeping 3 0  0 0  Tired, decreased energy 0 0  0 0  Change in appetite 0 0  0 0  Feeling bad or failure about yourself  0 0  0 0  Trouble concentrating  0  0 0  Moving slowly or fidgety/restless 0 0  0 0  Suicidal thoughts 0 0  0 0  PHQ-9 Score 3 0  0 0  Difficult doing work/chores Not difficult at all Not difficult at all  Not difficult at all Not difficult at all         07/01/2022    9:10 PM 07/02/2022    9:00 AM 07/02/2022    8:25 PM 07/03/2022   10:00 AM 07/25/2022   11:24 AM  Fall Risk  Falls in the past year?     1  Was there an injury with Fall?     1  Fall Risk Category Calculator     2  (RETIRED) Patient Fall Risk Level High fall risk High fall risk High fall risk High fall risk   Patient at Risk for Falls Due to     History of fall(s)  Fall risk Follow up     Falls evaluation  completed;Education provided    Review of Systems  Constitutional:  Negative for appetite change, fatigue and fever.  HENT:  Negative for congestion, ear pain, sinus pressure and sore throat.   Respiratory:  Negative for cough, chest tightness, shortness of breath and wheezing.   Cardiovascular:  Negative for chest pain and palpitations.  Gastrointestinal:  Negative for abdominal pain, constipation, diarrhea, nausea and vomiting.  Genitourinary:  Negative for dysuria and hematuria.  Musculoskeletal:  Negative for arthralgias, back pain, joint swelling and myalgias.  Skin:  Negative for rash.  Neurological:  Negative for dizziness, weakness and headaches.  Psychiatric/Behavioral:  Negative for dysphoric mood. The patient is not nervous/anxious.     Current Outpatient Medications on File Prior to Visit  Medication Sig Dispense Refill   albuterol (VENTOLIN HFA) 108 (90 Base) MCG/ACT inhaler Inhale two puffs every four to six hours as needed for cough or wheeze. 8  g 3   apixaban (ELIQUIS) 5 MG TABS tablet Take 1 tablet (5 mg total) by mouth 2 (two) times daily. 180 tablet 1   atorvastatin (LIPITOR) 40 MG tablet Take 1 tablet (40 mg total) by mouth daily. 90 tablet 3   B Complex-C-Calcium (B-COMPLEX/VITAMIN C, W/ CA, PO) Take by mouth.     budesonide-formoterol (SYMBICORT) 80-4.5 MCG/ACT inhaler INHALE 2 PUFFS INTO LUNGS TWICE DAILY, IN THE MORNING AND EVENING 30.6 g 6   celecoxib (CELEBREX) 100 MG capsule Take 100 mg by mouth 2 (two) times daily.     dicyclomine (BENTYL) 20 MG tablet TAKE ONE TABLET BY MOUTH FOUR TIMES DAILY BEFORE MEALS AND AT BEDTIME (Patient taking differently: Take 20 mg by mouth in the morning, at noon, in the evening, and at bedtime.) 60 tablet 1   estradiol (ESTRACE) 0.1 MG/GM vaginal cream Place 1 Applicatorful vaginally at bedtime.     famotidine (PEPCID) 40 MG tablet TAKE 1 TABLET(40 MG) BY MOUTH TWICE DAILY 180 tablet 2   fluticasone (FLONASE) 50 MCG/ACT nasal spray  Can use one spray in each nostril once daily as directed. 48 g 1   GEMTESA 75 MG TABS Take 1 tablet by mouth daily. 90 tablet 2   hydrocortisone (ANUSOL-HC) 2.5 % rectal cream Place 1 Application rectally 2 (two) times daily. 30 g 0   Hypromellose (ARTIFICIAL TEARS OP) Apply 1 drop to eye as needed for dry eyes.     loratadine (CLARITIN) 10 MG tablet Take 10 mg by mouth daily.     Multiple Vitamins-Minerals (HAIR SKIN & NAILS PO) Take by mouth.     Multiple Vitamins-Minerals (MULTIVITAMIN PO) Take 1 tablet by mouth daily.     tamsulosin (FLOMAX) 0.4 MG CAPS capsule Take 0.4 mg by mouth daily.     VITAMIN E PO Take 400 Units by mouth 2 (two) times daily.     apixaban (ELIQUIS) 2.5 MG TABS tablet Take 1 tablet (2.5 mg total) by mouth 2 (two) times daily for 1 dose. 60 tablet    No current facility-administered medications on file prior to visit.   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   GERD (gastroesophageal reflux disease)    Iliotibial band syndrome of left side 06/17/2015   Laryngopharyngeal reflux (LPR) 03/24/2015   Memory changes 09/16/2021   Migraines 1943 to present   "fairly frequently"; Takes Propranolol (01/12/2015)   Mixed hyperlipidemia 12/11/2018   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   Overactive bladder    Takes Vesicare   PAF (paroxysmal atrial fibrillation) ([place]) 05/04/2021   Palpitations 04/08/2021   Seborrheic dermatitis 12/21/2020   Past Surgical History:  Procedure Laterality Date   BREAST BIOPSY Left ~ [place], BILATERAL Bilateral 2003   CHEST TUBE INSERTION  January 2015   X 2 at [facility] [place]  4-5   JOINT REPLACEMENT     REPLACEMENT TOTAL KNEE Left    TONSILLECTOMY AND ADENOIDECTOMY  1946; 1947    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   Procedure: TOTAL HIP ARTHROPLASTY;  Surgeon: Willaim Sheng, MD;  Location: [facility];  Service: Orthopedics;  Laterality: Left;   TOTAL KNEE ARTHROPLASTY Left 01/11/2015   TOTAL KNEE ARTHROPLASTY Left 01/11/2015   Procedure: Left TOTAL KNEE ARTHROPLASTY;  Surgeon: Vickey Huger, MD;  Location: [facility];  Service: Orthopedics;  Laterality: Left;   VIDEO ASSISTED THORACOSCOPY (VATS)/THOROCOTOMY Left 07/13/2013   VATS with insertion of chest tubes    Family History  Problem Relation Age of Onset   Hypertension Mother    Diabetes Father    Hypertension Maternal Grandmother    Hypertension Maternal Grandfather    Social History   Socioeconomic History   Marital status: Widowed    Spouse name: Not on file   Number of children: 1   Years of education: Not on file   Highest education level: Not on file  Occupational History   Occupation: retired  Tobacco Use   Smoking status: Never   Smokeless tobacco: Never  Vaping Use   Vaping Use: Never used  Substance and Sexual Activity   Alcohol use: Yes    Alcohol/week: 2.0 standard drinks of alcohol    Types: 2 Glasses of wine per week    Comment: occasionally   Drug use: No   Sexual activity: Not Currently  Other Topics Concern   Not on file  Social History Narrative   Daughter lives nearby, granddaughter lives in [place] - married 3 times, all deceased.  Has not been in contact with her sister in >20 years   Social Determinants of Health   Financial Resource Strain: Low Risk  (03/14/2022)   Overall Financial Resource Strain (CARDIA)    Difficulty of Paying Living Expenses: Not hard at all  Food Insecurity: No Food Insecurity (10/11/2021)   Hunger Vital Sign    Worried About Running Out of Food in the Last Year: Never true    Ran Out of Food in the Last Year: Never true  Transportation Needs: No Transportation Needs (03/14/2022)   PRAPARE - Hydrologist (Medical): No     Lack of Transportation (Non-Medical): No  Physical Activity: Insufficiently Active (10/11/2021)   Exercise Vital Sign    Days of Exercise per Week: 2 days    Minutes of Exercise per Session: 10 min  Stress: No Stress Concern Present (10/11/2021)   [place]    Feeling of Stress : Not at all  Social Connections: Unknown (10/11/2021)   Social Connection and Isolation Panel [NHANES]    Frequency of Communication with Friends and Family: More than three times a week    Frequency of Social Gatherings with Friends and Family: More than three times a week    Attends Religious Services: Not on file    Active Member of Clubs or Organizations: No    Attends Archivist Meetings: Never    Marital Status: Widowed    Objective:  BP (!) 148/62 (BP Location: Left Arm, Patient Position: Sitting)   Pulse 77   Temp 97.8 F (36.6 C) (Temporal)   Ht 4' 11.6" (1.514 m)   Wt 141 lb (64 kg)   SpO2 99%   BMI 27.91 kg/m      07/25/2022   11:19 AM 07/03/2022   12:02 PM 07/03/2022    8:49 AM  BP/Weight  Systolic BP 132 440 102  Diastolic BP 62 56 83  Wt. (Lbs) 141    BMI 27.91 kg/m2      Physical Exam Vitals reviewed.  Constitutional:      Appearance: Normal appearance. She is normal weight.  Cardiovascular:     Rate and Rhythm: Normal rate and regular rhythm.     Heart sounds: Normal heart sounds.  Pulmonary:  Effort: Pulmonary effort is normal.     Breath sounds: Normal breath sounds.  Abdominal:     General: Abdomen is flat. Bowel sounds are normal.     Palpations: Abdomen is soft.  Neurological:     Mental Status: She is alert and oriented to person, place, and time.     Cranial Nerves: No cranial nerve deficit.     Motor: No weakness.     Coordination: Coordination normal.     Gait: Gait normal.  Psychiatric:        Mood and Affect: Mood normal.        Behavior: Behavior normal.          07/25/2022    11:58 AM 09/16/2021    9:29 AM  MMSE - Mini Mental State Exam  Orientation to time 5 4  Orientation to Place 5 5  Registration 3 3  Attention/ Calculation 5 5  Recall 3 3  Language- name 2 objects 2 2  Language- repeat 1 1  Language- follow 3 step command 3 3  Language- read & follow direction 1 1  Write a sentence 1 1  Copy design 1 1  Total score 30 29     Diabetic Foot Exam - Simple   No data filed      Lab Results  Component Value Date   WBC 6.7 07/25/2022   HGB 10.4 (L) 07/25/2022   HCT 31.3 (L) 07/25/2022   PLT 404 07/25/2022   GLUCOSE 82 07/25/2022   CHOL 171 07/25/2022   TRIG 71 07/25/2022   HDL 72 07/25/2022   LDLCALC 86 07/25/2022   ALT 14 07/25/2022   AST 20 07/25/2022   NA 137 07/25/2022   K 4.2 07/25/2022   CL 99 07/25/2022   CREATININE 0.90 07/25/2022   BUN 21 07/25/2022   CO2 18 (L) 07/25/2022   TSH 2.560 07/25/2022   INR 1.02 01/01/2015      Assessment & Plan:    Mixed hyperlipidemia Assessment & Plan: Well controlled.  No changes to medicines. Continue lipitor 40 mg before bed.  Continue to work on eating a healthy diet and exercise.  Labs drawn today.    Orders: -     Lipid panel -     Cardiovascular Risk Assessment  Essential hypertension Assessment & Plan: BP not at goal. Monitor.  Orders: -     CBC With Diff/Platelet -     Comprehensive metabolic panel  Memory loss Assessment & Plan: Check labs.  Consider mri of brain pending lab results.  I am not convinced she requires assisted living, but if patient wishes this will fill out FL2 when requested.   Orders: -     TSH -     Methylmalonic acid, serum -     B12 and Folate Panel -     MR BRAIN WO CONTRAST; Future  S/p left hip fracture Assessment & Plan: S/p surgery.  Doing well.       No orders of the defined types were placed in this encounter.   Orders Placed This Encounter  Procedures   MR Brain Wo Contrast   CBC With Diff/Platelet   Comprehensive  metabolic panel   Lipid panel   TSH   Methylmalonic acid, serum   B12 and Folate Panel   Cardiovascular Risk Assessment     Follow-up: Return in about 2 months (around 10/02/2022) for chronic fasting.  An After Visit Summary was printed and given to the patient.  I,Lauren M Auman,acting as a scribe for Blane Ohara, MD.,have documented all relevant documentation on the behalf of Blane Ohara, MD,as directed by  Blane Ohara, MD while in the presence of Blane Ohara, MD.    Blane Ohara, MD [facility] [phone]

## 2022-07-26 DIAGNOSIS — J4540 Moderate persistent asthma, uncomplicated: Secondary | ICD-10-CM | POA: Diagnosis not present

## 2022-07-26 DIAGNOSIS — I10 Essential (primary) hypertension: Secondary | ICD-10-CM | POA: Diagnosis not present

## 2022-07-26 DIAGNOSIS — M80052D Age-related osteoporosis with current pathological fracture, left femur, subsequent encounter for fracture with routine healing: Secondary | ICD-10-CM | POA: Diagnosis not present

## 2022-07-26 DIAGNOSIS — E782 Mixed hyperlipidemia: Secondary | ICD-10-CM | POA: Diagnosis not present

## 2022-07-26 DIAGNOSIS — F0394 Unspecified dementia, unspecified severity, with anxiety: Secondary | ICD-10-CM | POA: Diagnosis not present

## 2022-07-26 DIAGNOSIS — I480 Paroxysmal atrial fibrillation: Secondary | ICD-10-CM | POA: Diagnosis not present

## 2022-07-26 DIAGNOSIS — I700 Atherosclerosis of aorta: Secondary | ICD-10-CM | POA: Diagnosis not present

## 2022-07-26 DIAGNOSIS — G8918 Other acute postprocedural pain: Secondary | ICD-10-CM | POA: Diagnosis not present

## 2022-07-26 DIAGNOSIS — R339 Retention of urine, unspecified: Secondary | ICD-10-CM | POA: Diagnosis not present

## 2022-07-28 DIAGNOSIS — M80052D Age-related osteoporosis with current pathological fracture, left femur, subsequent encounter for fracture with routine healing: Secondary | ICD-10-CM | POA: Diagnosis not present

## 2022-07-28 DIAGNOSIS — I700 Atherosclerosis of aorta: Secondary | ICD-10-CM | POA: Diagnosis not present

## 2022-07-28 DIAGNOSIS — R339 Retention of urine, unspecified: Secondary | ICD-10-CM | POA: Diagnosis not present

## 2022-07-28 DIAGNOSIS — I10 Essential (primary) hypertension: Secondary | ICD-10-CM | POA: Diagnosis not present

## 2022-07-28 DIAGNOSIS — I480 Paroxysmal atrial fibrillation: Secondary | ICD-10-CM | POA: Diagnosis not present

## 2022-07-28 DIAGNOSIS — F0394 Unspecified dementia, unspecified severity, with anxiety: Secondary | ICD-10-CM | POA: Diagnosis not present

## 2022-07-28 DIAGNOSIS — J4540 Moderate persistent asthma, uncomplicated: Secondary | ICD-10-CM | POA: Diagnosis not present

## 2022-07-28 DIAGNOSIS — E782 Mixed hyperlipidemia: Secondary | ICD-10-CM | POA: Diagnosis not present

## 2022-07-28 DIAGNOSIS — G8918 Other acute postprocedural pain: Secondary | ICD-10-CM | POA: Diagnosis not present

## 2022-07-29 DIAGNOSIS — Z8781 Personal history of (healed) traumatic fracture: Secondary | ICD-10-CM

## 2022-07-29 HISTORY — DX: Personal history of (healed) traumatic fracture: Z87.81

## 2022-07-29 LAB — LIPID PANEL
Chol/HDL Ratio: 2.4 ratio (ref 0.0–4.4)
Cholesterol, Total: 171 mg/dL (ref 100–199)
HDL: 72 mg/dL (ref >39)
LDL Chol Calc (NIH): 86 mg/dL (ref 0–99)
Triglycerides: 71 mg/dL (ref 0–149)
VLDL Cholesterol Cal: 13 mg/dL (ref 5–40)

## 2022-07-29 LAB — CBC WITH DIFF/PLATELET
Basophils Absolute: 0 10*3/uL (ref 0.0–0.2)
Basos: 1 %
EOS (ABSOLUTE): 0.1 10*3/uL (ref 0.0–0.4)
Eos: 1 %
Hematocrit: 31.3 % — ABNORMAL LOW (ref 34.0–46.6)
Hemoglobin: 10.4 g/dL — ABNORMAL LOW (ref 11.1–15.9)
Immature Grans (Abs): 0 10*3/uL (ref 0.0–0.1)
Immature Granulocytes: 0 %
Lymphocytes Absolute: 1.7 10*3/uL (ref 0.7–3.1)
Lymphs: 25 %
MCH: 30 pg (ref 26.6–33.0)
MCHC: 33.2 g/dL (ref 31.5–35.7)
MCV: 90 fL (ref 79–97)
Monocytes Absolute: 0.5 10*3/uL (ref 0.1–0.9)
Monocytes: 8 %
Neutrophils Absolute: 4.4 10*3/uL (ref 1.4–7.0)
Neutrophils: 65 %
Platelets: 404 10*3/uL (ref 150–450)
RBC: 3.47 x10E6/uL — ABNORMAL LOW (ref 3.77–5.28)
RDW: 12.8 % (ref 11.7–15.4)
WBC: 6.7 10*3/uL (ref 3.4–10.8)

## 2022-07-29 LAB — COMPREHENSIVE METABOLIC PANEL
ALT: 14 IU/L (ref 0–32)
AST: 20 IU/L (ref 0–40)
Albumin/Globulin Ratio: 2 (ref 1.2–2.2)
Albumin: 4.2 g/dL (ref 3.8–4.8)
Alkaline Phosphatase: 128 IU/L — ABNORMAL HIGH (ref 44–121)
BUN/Creatinine Ratio: 23 (ref 12–28)
BUN: 21 mg/dL (ref 8–27)
Bilirubin Total: 0.3 mg/dL (ref 0.0–1.2)
CO2: 18 mmol/L — ABNORMAL LOW (ref 20–29)
Calcium: 9.8 mg/dL (ref 8.7–10.3)
Chloride: 99 mmol/L (ref 96–106)
Creatinine, Ser: 0.9 mg/dL (ref 0.57–1.00)
Globulin, Total: 2.1 g/dL (ref 1.5–4.5)
Glucose: 82 mg/dL (ref 70–99)
Potassium: 4.2 mmol/L (ref 3.5–5.2)
Sodium: 137 mmol/L (ref 134–144)
Total Protein: 6.3 g/dL (ref 6.0–8.5)
eGFR: 65 mL/min/{1.73_m2} (ref >59)

## 2022-07-29 LAB — METHYLMALONIC ACID, SERUM: Methylmalonic Acid: 181 nmol/L (ref 0–378)

## 2022-07-29 LAB — B12 AND FOLATE PANEL
Folate: >20 ng/mL (ref >3.0)
Vitamin B-12: 1051 pg/mL (ref 232–1245)

## 2022-07-29 LAB — CARDIOVASCULAR RISK ASSESSMENT

## 2022-07-29 LAB — TSH: TSH: 2.56 u[IU]/mL (ref 0.450–4.500)

## 2022-07-29 NOTE — Assessment & Plan Note (Signed)
Well controlled.  No changes to medicines. Continue lipitor 40 mg before bed.  Continue to work on eating a healthy diet and exercise.  Labs drawn today.   

## 2022-07-29 NOTE — Assessment & Plan Note (Signed)
S/p surgery.  Doing well.   

## 2022-07-29 NOTE — Assessment & Plan Note (Addendum)
Check labs.  Consider mri of brain pending lab results.  I am not convinced she requires assisted living, but if patient wishes this will fill out FL2 when requested.

## 2022-07-29 NOTE — Assessment & Plan Note (Signed)
BP not at goal. Monitor.

## 2022-08-02 DIAGNOSIS — I480 Paroxysmal atrial fibrillation: Secondary | ICD-10-CM | POA: Diagnosis not present

## 2022-08-02 DIAGNOSIS — R339 Retention of urine, unspecified: Secondary | ICD-10-CM | POA: Diagnosis not present

## 2022-08-02 DIAGNOSIS — G8918 Other acute postprocedural pain: Secondary | ICD-10-CM | POA: Diagnosis not present

## 2022-08-02 DIAGNOSIS — F0394 Unspecified dementia, unspecified severity, with anxiety: Secondary | ICD-10-CM | POA: Diagnosis not present

## 2022-08-02 DIAGNOSIS — M80052D Age-related osteoporosis with current pathological fracture, left femur, subsequent encounter for fracture with routine healing: Secondary | ICD-10-CM | POA: Diagnosis not present

## 2022-08-02 DIAGNOSIS — I700 Atherosclerosis of aorta: Secondary | ICD-10-CM | POA: Diagnosis not present

## 2022-08-02 DIAGNOSIS — E782 Mixed hyperlipidemia: Secondary | ICD-10-CM | POA: Diagnosis not present

## 2022-08-02 DIAGNOSIS — J4540 Moderate persistent asthma, uncomplicated: Secondary | ICD-10-CM | POA: Diagnosis not present

## 2022-08-02 DIAGNOSIS — I10 Essential (primary) hypertension: Secondary | ICD-10-CM | POA: Diagnosis not present

## 2022-08-03 DIAGNOSIS — G8918 Other acute postprocedural pain: Secondary | ICD-10-CM | POA: Diagnosis not present

## 2022-08-03 DIAGNOSIS — I10 Essential (primary) hypertension: Secondary | ICD-10-CM | POA: Diagnosis not present

## 2022-08-03 DIAGNOSIS — M80052D Age-related osteoporosis with current pathological fracture, left femur, subsequent encounter for fracture with routine healing: Secondary | ICD-10-CM | POA: Diagnosis not present

## 2022-08-03 DIAGNOSIS — F0394 Unspecified dementia, unspecified severity, with anxiety: Secondary | ICD-10-CM | POA: Diagnosis not present

## 2022-08-03 DIAGNOSIS — R339 Retention of urine, unspecified: Secondary | ICD-10-CM | POA: Diagnosis not present

## 2022-08-03 DIAGNOSIS — E782 Mixed hyperlipidemia: Secondary | ICD-10-CM | POA: Diagnosis not present

## 2022-08-03 DIAGNOSIS — I480 Paroxysmal atrial fibrillation: Secondary | ICD-10-CM | POA: Diagnosis not present

## 2022-08-03 DIAGNOSIS — J4540 Moderate persistent asthma, uncomplicated: Secondary | ICD-10-CM | POA: Diagnosis not present

## 2022-08-03 DIAGNOSIS — I700 Atherosclerosis of aorta: Secondary | ICD-10-CM | POA: Diagnosis not present

## 2022-08-04 DIAGNOSIS — I480 Paroxysmal atrial fibrillation: Secondary | ICD-10-CM | POA: Diagnosis not present

## 2022-08-04 DIAGNOSIS — R339 Retention of urine, unspecified: Secondary | ICD-10-CM | POA: Diagnosis not present

## 2022-08-04 DIAGNOSIS — G8918 Other acute postprocedural pain: Secondary | ICD-10-CM | POA: Diagnosis not present

## 2022-08-04 DIAGNOSIS — I700 Atherosclerosis of aorta: Secondary | ICD-10-CM | POA: Diagnosis not present

## 2022-08-04 DIAGNOSIS — J4540 Moderate persistent asthma, uncomplicated: Secondary | ICD-10-CM | POA: Diagnosis not present

## 2022-08-04 DIAGNOSIS — M80052D Age-related osteoporosis with current pathological fracture, left femur, subsequent encounter for fracture with routine healing: Secondary | ICD-10-CM | POA: Diagnosis not present

## 2022-08-04 DIAGNOSIS — I10 Essential (primary) hypertension: Secondary | ICD-10-CM | POA: Diagnosis not present

## 2022-08-04 DIAGNOSIS — F0394 Unspecified dementia, unspecified severity, with anxiety: Secondary | ICD-10-CM | POA: Diagnosis not present

## 2022-08-04 DIAGNOSIS — E782 Mixed hyperlipidemia: Secondary | ICD-10-CM | POA: Diagnosis not present

## 2022-08-07 ENCOUNTER — Telehealth: Payer: Self-pay | Admitting: Family Medicine

## 2022-08-07 DIAGNOSIS — R413 Other amnesia: Secondary | ICD-10-CM | POA: Diagnosis not present

## 2022-08-07 DIAGNOSIS — I480 Paroxysmal atrial fibrillation: Secondary | ICD-10-CM | POA: Diagnosis not present

## 2022-08-07 DIAGNOSIS — J4540 Moderate persistent asthma, uncomplicated: Secondary | ICD-10-CM | POA: Diagnosis not present

## 2022-08-07 DIAGNOSIS — I700 Atherosclerosis of aorta: Secondary | ICD-10-CM | POA: Diagnosis not present

## 2022-08-07 DIAGNOSIS — E782 Mixed hyperlipidemia: Secondary | ICD-10-CM | POA: Diagnosis not present

## 2022-08-07 DIAGNOSIS — F0394 Unspecified dementia, unspecified severity, with anxiety: Secondary | ICD-10-CM | POA: Diagnosis not present

## 2022-08-07 DIAGNOSIS — G8918 Other acute postprocedural pain: Secondary | ICD-10-CM | POA: Diagnosis not present

## 2022-08-07 DIAGNOSIS — M80052D Age-related osteoporosis with current pathological fracture, left femur, subsequent encounter for fracture with routine healing: Secondary | ICD-10-CM | POA: Diagnosis not present

## 2022-08-07 DIAGNOSIS — I10 Essential (primary) hypertension: Secondary | ICD-10-CM | POA: Diagnosis not present

## 2022-08-07 DIAGNOSIS — R339 Retention of urine, unspecified: Secondary | ICD-10-CM | POA: Diagnosis not present

## 2022-08-07 NOTE — Telephone Encounter (Signed)
DAUGHTER PICKED UP THE PT FORMS AND REQUEST THAT YOU CAN START FEELING OUT THE FL2 FORMS.

## 2022-08-08 ENCOUNTER — Telehealth: Payer: Self-pay

## 2022-08-08 NOTE — Progress Notes (Signed)
Called pt today after getting a message that pt is out of rehab and they need to pick back up her medication delivery. I called and lvm to return my call to go over medications.   Current last delivery meds: Budesonide-formoterol 80-4.26mg- Plenty of supply does not need Albuterol Inhaler- Plenty of supply, does not need Celebrex 100 mg twice daily- Pt has 40 ds left on this, Approx next fill date 09/19/22 Atorvastatin 412m1 daily- 13 pills left. Sent an acute fill date for 08/21/22 Famotidine 4064mwice daily- 100 pills left Approx  next fill date 09/29/22 ASA 325m5mdaily-OTC Womens MVI-OTC Vitamin E 180mg68m Vitamin D 125mg-84mCalcium 600mg-O85micyclomine 20mg-1 45m4x daily- 104 pills left  (Taking 2 daily ) Approx next fill date 10/01/22 Gemtesa Logan Boresi27m Sara JeanKatheren Shams(Been getting samples) Is out- Spoke with nurse and they are sending a script to [facility] Estradiol 0.1mg Vagin61mCream- Plenty of supply Hydrocortisone 2.5% Cream-Plenty of Supply Eliquis 5mg twice 7mly-108 pills left Approx next fill date 10/03/22 Tamsulosin 0.4mg daily-714mills left. Approx next fill date 10/21/22 Loratadine 10mg-Needs s75mt. Wants price  Artificial Tears-OTC Fluticasone 50mcg- Plenty72msupply does not need  New address [address] [id number] [facility] for upcoming deliveries    Danielle GerriElray Mcgregor PHarrisonsistant  [phone]

## 2022-08-08 NOTE — Telephone Encounter (Signed)
Shannon Martin called with questions about continuing [facility].  She recently was discharged from [facility] and currently is living with her daughter.  She was going to reach out to [facility] with her new address and call us back with any problems.

## 2022-08-10 ENCOUNTER — Other Ambulatory Visit: Payer: Self-pay

## 2022-08-10 DIAGNOSIS — R413 Other amnesia: Secondary | ICD-10-CM

## 2022-08-11 DIAGNOSIS — I480 Paroxysmal atrial fibrillation: Secondary | ICD-10-CM | POA: Diagnosis not present

## 2022-08-11 DIAGNOSIS — G8918 Other acute postprocedural pain: Secondary | ICD-10-CM | POA: Diagnosis not present

## 2022-08-11 DIAGNOSIS — R339 Retention of urine, unspecified: Secondary | ICD-10-CM | POA: Diagnosis not present

## 2022-08-11 DIAGNOSIS — F0394 Unspecified dementia, unspecified severity, with anxiety: Secondary | ICD-10-CM | POA: Diagnosis not present

## 2022-08-11 DIAGNOSIS — E782 Mixed hyperlipidemia: Secondary | ICD-10-CM | POA: Diagnosis not present

## 2022-08-11 DIAGNOSIS — M80052D Age-related osteoporosis with current pathological fracture, left femur, subsequent encounter for fracture with routine healing: Secondary | ICD-10-CM | POA: Diagnosis not present

## 2022-08-11 DIAGNOSIS — I10 Essential (primary) hypertension: Secondary | ICD-10-CM | POA: Diagnosis not present

## 2022-08-11 DIAGNOSIS — J4540 Moderate persistent asthma, uncomplicated: Secondary | ICD-10-CM | POA: Diagnosis not present

## 2022-08-11 DIAGNOSIS — I700 Atherosclerosis of aorta: Secondary | ICD-10-CM | POA: Diagnosis not present

## 2022-08-14 DIAGNOSIS — R339 Retention of urine, unspecified: Secondary | ICD-10-CM | POA: Diagnosis not present

## 2022-08-14 DIAGNOSIS — I10 Essential (primary) hypertension: Secondary | ICD-10-CM | POA: Diagnosis not present

## 2022-08-14 DIAGNOSIS — M80052D Age-related osteoporosis with current pathological fracture, left femur, subsequent encounter for fracture with routine healing: Secondary | ICD-10-CM | POA: Diagnosis not present

## 2022-08-14 DIAGNOSIS — E782 Mixed hyperlipidemia: Secondary | ICD-10-CM | POA: Diagnosis not present

## 2022-08-14 DIAGNOSIS — I480 Paroxysmal atrial fibrillation: Secondary | ICD-10-CM | POA: Diagnosis not present

## 2022-08-14 DIAGNOSIS — G8918 Other acute postprocedural pain: Secondary | ICD-10-CM | POA: Diagnosis not present

## 2022-08-14 DIAGNOSIS — F0394 Unspecified dementia, unspecified severity, with anxiety: Secondary | ICD-10-CM | POA: Diagnosis not present

## 2022-08-14 DIAGNOSIS — J4540 Moderate persistent asthma, uncomplicated: Secondary | ICD-10-CM | POA: Diagnosis not present

## 2022-08-14 DIAGNOSIS — I700 Atherosclerosis of aorta: Secondary | ICD-10-CM | POA: Diagnosis not present

## 2022-08-16 DIAGNOSIS — G8918 Other acute postprocedural pain: Secondary | ICD-10-CM | POA: Diagnosis not present

## 2022-08-16 DIAGNOSIS — I480 Paroxysmal atrial fibrillation: Secondary | ICD-10-CM | POA: Diagnosis not present

## 2022-08-16 DIAGNOSIS — I10 Essential (primary) hypertension: Secondary | ICD-10-CM | POA: Diagnosis not present

## 2022-08-16 DIAGNOSIS — E782 Mixed hyperlipidemia: Secondary | ICD-10-CM | POA: Diagnosis not present

## 2022-08-16 DIAGNOSIS — I700 Atherosclerosis of aorta: Secondary | ICD-10-CM | POA: Diagnosis not present

## 2022-08-16 DIAGNOSIS — M80052D Age-related osteoporosis with current pathological fracture, left femur, subsequent encounter for fracture with routine healing: Secondary | ICD-10-CM | POA: Diagnosis not present

## 2022-08-16 DIAGNOSIS — J4540 Moderate persistent asthma, uncomplicated: Secondary | ICD-10-CM | POA: Diagnosis not present

## 2022-08-16 DIAGNOSIS — F0394 Unspecified dementia, unspecified severity, with anxiety: Secondary | ICD-10-CM | POA: Diagnosis not present

## 2022-08-16 DIAGNOSIS — R339 Retention of urine, unspecified: Secondary | ICD-10-CM | POA: Diagnosis not present

## 2022-08-21 DIAGNOSIS — E782 Mixed hyperlipidemia: Secondary | ICD-10-CM | POA: Diagnosis not present

## 2022-08-21 DIAGNOSIS — F0394 Unspecified dementia, unspecified severity, with anxiety: Secondary | ICD-10-CM | POA: Diagnosis not present

## 2022-08-21 DIAGNOSIS — M80052D Age-related osteoporosis with current pathological fracture, left femur, subsequent encounter for fracture with routine healing: Secondary | ICD-10-CM | POA: Diagnosis not present

## 2022-08-21 DIAGNOSIS — I700 Atherosclerosis of aorta: Secondary | ICD-10-CM | POA: Diagnosis not present

## 2022-08-21 DIAGNOSIS — R339 Retention of urine, unspecified: Secondary | ICD-10-CM | POA: Diagnosis not present

## 2022-08-21 DIAGNOSIS — G8918 Other acute postprocedural pain: Secondary | ICD-10-CM | POA: Diagnosis not present

## 2022-08-21 DIAGNOSIS — I480 Paroxysmal atrial fibrillation: Secondary | ICD-10-CM | POA: Diagnosis not present

## 2022-08-21 DIAGNOSIS — I10 Essential (primary) hypertension: Secondary | ICD-10-CM | POA: Diagnosis not present

## 2022-08-21 DIAGNOSIS — J4540 Moderate persistent asthma, uncomplicated: Secondary | ICD-10-CM | POA: Diagnosis not present

## 2022-08-22 ENCOUNTER — Telehealth: Payer: Self-pay

## 2022-08-22 DIAGNOSIS — F0394 Unspecified dementia, unspecified severity, with anxiety: Secondary | ICD-10-CM | POA: Diagnosis not present

## 2022-08-22 DIAGNOSIS — R339 Retention of urine, unspecified: Secondary | ICD-10-CM | POA: Diagnosis not present

## 2022-08-22 DIAGNOSIS — I480 Paroxysmal atrial fibrillation: Secondary | ICD-10-CM | POA: Diagnosis not present

## 2022-08-22 DIAGNOSIS — M80052D Age-related osteoporosis with current pathological fracture, left femur, subsequent encounter for fracture with routine healing: Secondary | ICD-10-CM | POA: Diagnosis not present

## 2022-08-22 DIAGNOSIS — I700 Atherosclerosis of aorta: Secondary | ICD-10-CM | POA: Diagnosis not present

## 2022-08-22 DIAGNOSIS — M25552 Pain in left hip: Secondary | ICD-10-CM | POA: Diagnosis not present

## 2022-08-22 DIAGNOSIS — M25511 Pain in right shoulder: Secondary | ICD-10-CM | POA: Diagnosis not present

## 2022-08-22 DIAGNOSIS — M19012 Primary osteoarthritis, left shoulder: Secondary | ICD-10-CM | POA: Diagnosis not present

## 2022-08-22 DIAGNOSIS — E782 Mixed hyperlipidemia: Secondary | ICD-10-CM | POA: Diagnosis not present

## 2022-08-22 DIAGNOSIS — J4540 Moderate persistent asthma, uncomplicated: Secondary | ICD-10-CM | POA: Diagnosis not present

## 2022-08-22 DIAGNOSIS — G8918 Other acute postprocedural pain: Secondary | ICD-10-CM | POA: Diagnosis not present

## 2022-08-22 DIAGNOSIS — I10 Essential (primary) hypertension: Secondary | ICD-10-CM | POA: Diagnosis not present

## 2022-08-22 NOTE — Telephone Encounter (Signed)
Left message for Shannon Martin to call our office. Per Dr. Tobie Poet, need to confirm "highlighted" medications that are on the printed list. Any confirmed medications need to be added to patient FL2 form. Forms are in my folder on my desk.

## 2022-08-23 DIAGNOSIS — R339 Retention of urine, unspecified: Secondary | ICD-10-CM | POA: Diagnosis not present

## 2022-08-23 DIAGNOSIS — N3281 Overactive bladder: Secondary | ICD-10-CM | POA: Diagnosis not present

## 2022-08-23 DIAGNOSIS — N952 Postmenopausal atrophic vaginitis: Secondary | ICD-10-CM | POA: Diagnosis not present

## 2022-08-29 DIAGNOSIS — I480 Paroxysmal atrial fibrillation: Secondary | ICD-10-CM | POA: Diagnosis not present

## 2022-08-29 DIAGNOSIS — I700 Atherosclerosis of aorta: Secondary | ICD-10-CM | POA: Diagnosis not present

## 2022-08-29 DIAGNOSIS — R339 Retention of urine, unspecified: Secondary | ICD-10-CM | POA: Diagnosis not present

## 2022-08-29 DIAGNOSIS — E782 Mixed hyperlipidemia: Secondary | ICD-10-CM | POA: Diagnosis not present

## 2022-08-29 DIAGNOSIS — J4540 Moderate persistent asthma, uncomplicated: Secondary | ICD-10-CM | POA: Diagnosis not present

## 2022-08-29 DIAGNOSIS — G8918 Other acute postprocedural pain: Secondary | ICD-10-CM | POA: Diagnosis not present

## 2022-08-29 DIAGNOSIS — F0394 Unspecified dementia, unspecified severity, with anxiety: Secondary | ICD-10-CM | POA: Diagnosis not present

## 2022-08-29 DIAGNOSIS — M80052D Age-related osteoporosis with current pathological fracture, left femur, subsequent encounter for fracture with routine healing: Secondary | ICD-10-CM | POA: Diagnosis not present

## 2022-08-29 DIAGNOSIS — I10 Essential (primary) hypertension: Secondary | ICD-10-CM | POA: Diagnosis not present

## 2022-09-06 ENCOUNTER — Telehealth: Payer: Self-pay

## 2022-09-06 NOTE — Progress Notes (Signed)
Care Management & Coordination Services Pharmacy Team  Reason for Encounter: Medication coordination and delivery  Contacted patient to discuss medications and coordinate delivery from [facility]. Spoke with patient on 09/06/2022  [facility] dispensing form sent to Pattricia Boss for review.   Last adherence delivery date: 07-14-2022 (Due to being in rehab)  Patient is due for next adherence delivery on: 09-18-2022  This delivery to include: Vials  30 Days  Celebrex 100 mg twice daily Atorvastatin '40mg'$  1 daily   Famotidine '40mg'$  twice daily  Womens MV 1 daily  Vitamin E 1 daily  Vitamin D 1 daily  Calcium-D 1 daily  Dicyclomine '20mg'$ -1 tab 4x daily   Patient declined the following medications this month: Budesonide-formoterol- Plenty supply ASA '81mg'$ - D/C Artificial Tears-OTC  Claritin- OTC Eliquis- Due in april Flomax- Due in April Estradiol 0.'1mg'$  Vaginal Cream- Plenty of supply Hydrocortisone 2.5% Cream-Plenty of Supply Gemtesa- Due in May Albuterol- Plenty supply  No refill request needed.  Confirmed delivery date of 09-18-2022, advised patient that pharmacy will contact them the morning of delivery.   Any concerns about your medications? No  How often do you forget or accidentally miss a dose? Never  Do you use a pillbox? Yes  Is patient in packaging No  Recent blood pressure readings are as follows: Patient stated she wasn't around readings but its been around 138/70.  Chart review: Recent office visits:  07-25-2022 CoxElnita Maxwell, MD. RBC= 3.47, Hemo= 10.4, Hema= 31.3. CO2, Alkaline Phosphatase= 128. Stopped due to patient preference Vit C, aspirin, Calcium. Completed xanax, oxycodone.  Recent consult visits:  08-07-2022 Gilford Silvius (Radiologist). Unable to voew encounter  07-18-2022 Charlies Constable A (Orthopedic). Unable to view encounter.  Hospital visits:  None in previous 6 months  Medications: Outpatient Encounter Medications as of  09/06/2022  Medication Sig   albuterol (VENTOLIN HFA) 108 (90 Base) MCG/ACT inhaler Inhale two puffs every four to six hours as needed for cough or wheeze.   apixaban (ELIQUIS) 2.5 MG TABS tablet Take 1 tablet (2.5 mg total) by mouth 2 (two) times daily for 1 dose.   apixaban (ELIQUIS) 5 MG TABS tablet Take 1 tablet (5 mg total) by mouth 2 (two) times daily.   atorvastatin (LIPITOR) 40 MG tablet Take 1 tablet (40 mg total) by mouth daily.   B Complex-C-Calcium (B-COMPLEX/VITAMIN C, W/ CA, PO) Take by mouth.   budesonide-formoterol (SYMBICORT) 80-4.5 MCG/ACT inhaler INHALE 2 PUFFS INTO LUNGS TWICE DAILY, IN THE MORNING AND EVENING   celecoxib (CELEBREX) 100 MG capsule Take 100 mg by mouth 2 (two) times daily.   dicyclomine (BENTYL) 20 MG tablet TAKE ONE TABLET BY MOUTH FOUR TIMES DAILY BEFORE MEALS AND AT BEDTIME (Patient taking differently: Take 20 mg by mouth in the morning, at noon, in the evening, and at bedtime.)   estradiol (ESTRACE) 0.1 MG/GM vaginal cream Place 1 Applicatorful vaginally at bedtime.   famotidine (PEPCID) 40 MG tablet TAKE 1 TABLET(40 MG) BY MOUTH TWICE DAILY   fluticasone (FLONASE) 50 MCG/ACT nasal spray Can use one spray in each nostril once daily as directed.   GEMTESA 75 MG TABS Take 1 tablet by mouth daily.   hydrocortisone (ANUSOL-HC) 2.5 % rectal cream Place 1 Application rectally 2 (two) times daily.   Hypromellose (ARTIFICIAL TEARS OP) Apply 1 drop to eye as needed for dry eyes.   loratadine (CLARITIN) 10 MG tablet Take 10 mg by mouth daily.   Multiple Vitamins-Minerals (HAIR SKIN & NAILS PO) Take by mouth.  Multiple Vitamins-Minerals (MULTIVITAMIN PO) Take 1 tablet by mouth daily.   tamsulosin (FLOMAX) 0.4 MG CAPS capsule Take 0.4 mg by mouth daily.   VITAMIN E PO Take 400 Units by mouth 2 (two) times daily.   No facility-administered encounter medications on file as of 09/06/2022.   BP Readings from Last 3 Encounters:  07/25/22 (!) 148/62  07/03/22 (!) 132/56   06/14/22 (!) 140/78    Pulse Readings from Last 3 Encounters:  07/25/22 77  07/03/22 96  06/14/22 85    No results found for: "HGBA1C" Lab Results  Component Value Date   CREATININE 0.90 07/25/2022   BUN 21 07/25/2022   GFRNONAA 55 (L) 07/03/2022   GFRAA 59 (L) 03/09/2020   NA 137 07/25/2022   K 4.2 07/25/2022   CALCIUM 9.8 07/25/2022   CO2 18 (L) 07/25/2022     [facility] Pharmacist Assistant [phone]

## 2022-09-13 ENCOUNTER — Other Ambulatory Visit: Payer: Self-pay | Admitting: Family Medicine

## 2022-09-13 ENCOUNTER — Other Ambulatory Visit: Payer: Self-pay

## 2022-09-13 DIAGNOSIS — K580 Irritable bowel syndrome with diarrhea: Secondary | ICD-10-CM

## 2022-09-13 MED ORDER — DICYCLOMINE HCL 20 MG PO TABS: ORAL_TABLET | ORAL | 1 refills | Status: DC

## 2022-09-20 ENCOUNTER — Ambulatory Visit: Payer: Medicare HMO | Admitting: Family Medicine

## 2022-09-27 ENCOUNTER — Telehealth: Payer: Self-pay

## 2022-09-27 NOTE — Telephone Encounter (Signed)
Patient's daughter called checking status of FL2 paperwork -- can you please advise

## 2022-10-01 NOTE — Progress Notes (Unsigned)
Subjective:  Patient ID: Shannon Martin, female    DOB: 11-Jun-1941  Age: 82 y.o. MRN: [id number]  No chief complaint on file.   HPI Anemia  Elevated glucose:  Memory loss: work up has been negative.       03/28/2022    9:07 AM 10/11/2021    9:28 AM 10/04/2021   10:39 AM 09/16/2021    8:44 AM 03/18/2021    9:02 AM  Depression screen PHQ 2/9  Decreased Interest 0 0 0 0 0  Down, Depressed, Hopeless 0 0 0 0 0  PHQ - 2 Score 0 0 0 0 0  Altered sleeping 3 0  0 0  Tired, decreased energy 0 0  0 0  Change in appetite 0 0  0 0  Feeling bad or failure about yourself  0 0  0 0  Trouble concentrating  0  0 0  Moving slowly or fidgety/restless 0 0  0 0  Suicidal thoughts 0 0  0 0  PHQ-9 Score 3 0  0 0  Difficult doing work/chores Not difficult at all Not difficult at all  Not difficult at all Not difficult at all         07/01/2022    9:10 PM 07/02/2022    9:00 AM 07/02/2022    8:25 PM 07/03/2022   10:00 AM 07/25/2022   11:24 AM  Fall Risk  Falls in the past year?     1  Was there an injury with Fall?     1  Fall Risk Category Calculator     2  (RETIRED) Patient Fall Risk Level High fall risk High fall risk High fall risk High fall risk   Patient at Risk for Falls Due to     History of fall(s)  Fall risk Follow up     Falls evaluation completed;Education provided      Review of Systems  Constitutional:  Negative for chills, fatigue and fever.  HENT:  Negative for congestion, ear pain and sore throat.   Respiratory:  Negative for cough and shortness of breath.   Cardiovascular:  Negative for chest pain.  Gastrointestinal:  Negative for abdominal pain, constipation, diarrhea, nausea and vomiting.  Genitourinary:  Negative for dysuria and urgency.  Musculoskeletal:  Negative for arthralgias and myalgias.  Skin:  Negative for rash.  Neurological:  Negative for dizziness and headaches.  Psychiatric/Behavioral:  Negative for dysphoric mood. The patient is not nervous/anxious.      Current Outpatient Medications on File Prior to Visit  Medication Sig Dispense Refill   albuterol (VENTOLIN HFA) 108 (90 Base) MCG/ACT inhaler Inhale two puffs every four to six hours as needed for cough or wheeze. 8 g 3   apixaban (ELIQUIS) 2.5 MG TABS tablet Take 1 tablet (2.5 mg total) by mouth 2 (two) times daily for 1 dose. 60 tablet    apixaban (ELIQUIS) 5 MG TABS tablet Take 1 tablet (5 mg total) by mouth 2 (two) times daily. 180 tablet 1   atorvastatin (LIPITOR) 40 MG tablet Take 1 tablet (40 mg total) by mouth daily. 90 tablet 3   B Complex-C-Calcium (B-COMPLEX/VITAMIN C, W/ CA, PO) Take by mouth.     budesonide-formoterol (SYMBICORT) 80-4.5 MCG/ACT inhaler INHALE 2 PUFFS INTO LUNGS TWICE DAILY, IN THE MORNING AND EVENING 30.6 g 6   celecoxib (CELEBREX) 100 MG capsule Take 100 mg by mouth 2 (two) times daily.     dicyclomine (BENTYL) 20 MG tablet TAKE ONE TABLET BY  MOUTH FOUR TIMES DAILY BEFORE MEALS AND AT BEDTIME Strength: 20 mg 60 tablet 1   estradiol (ESTRACE) 0.1 MG/GM vaginal cream Place 1 Applicatorful vaginally at bedtime.     famotidine (PEPCID) 40 MG tablet TAKE 1 TABLET(40 MG) BY MOUTH TWICE DAILY 180 tablet 2   fluticasone (FLONASE) 50 MCG/ACT nasal spray Can use one spray in each nostril once daily as directed. 48 g 1   GEMTESA 75 MG TABS Take 1 tablet by mouth daily. 90 tablet 2   hydrocortisone (ANUSOL-HC) 2.5 % rectal cream Place 1 Application rectally 2 (two) times daily. 30 g 0   Hypromellose (ARTIFICIAL TEARS OP) Apply 1 drop to eye as needed for dry eyes.     loratadine (CLARITIN) 10 MG tablet Take 10 mg by mouth daily.     Multiple Vitamins-Minerals (HAIR SKIN & NAILS PO) Take by mouth.     Multiple Vitamins-Minerals (MULTIVITAMIN PO) Take 1 tablet by mouth daily.     tamsulosin (FLOMAX) 0.4 MG CAPS capsule Take 0.4 mg by mouth daily.     VITAMIN E PO Take 400 Units by mouth 2 (two) times daily.     No current facility-administered medications on file prior  to visit.   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   GERD (gastroesophageal reflux disease)    Iliotibial band syndrome of left side 06/17/2015   Laryngopharyngeal reflux (LPR) 03/24/2015   Memory changes 09/16/2021   Migraines 1943 to present   "fairly frequently"; Takes Propranolol (01/12/2015)   Mixed hyperlipidemia 12/11/2018   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   Overactive bladder    Takes Vesicare   PAF (paroxysmal atrial fibrillation) (HCC) 05/04/2021   Palpitations 04/08/2021   Seborrheic dermatitis 12/21/2020   Past Surgical History:  Procedure Laterality Date   BREAST BIOPSY Left ~ 1973   CATARACT EXTRACTION W/ INTRAOCULAR LENS  IMPLANT, BILATERAL Bilateral 2003   CHEST TUBE [facility]ION  January 2015   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   Procedure: TOTAL HIP ARTHROPLASTY;  Surgeon: Joen LauraMarchwiany, Daniel A, MD;  Location: [facility] OR;  Service: Orthopedics;  Laterality: Left;   TOTAL KNEE ARTHROPLASTY Left 01/11/2015   TOTAL KNEE ARTHROPLASTY Left 01/11/2015   Procedure: Left TOTAL KNEE ARTHROPLASTY;  Surgeon: Dannielle HuhSteve Lucey, MD;  Location: [facility] OR;  Service: Orthopedics;  Laterality: Left;   VIDEO ASSISTED THORACOSCOPY (VATS)/THOROCOTOMY Left 07/13/2013   VATS with insertion of chest tubes    Family History  Problem Relation Age of Onset   Hypertension Mother    Diabetes Father    Hypertension Maternal Grandmother    Hypertension Maternal Grandfather    Social History   Socioeconomic History   Marital status: Widowed    Spouse name: Not on file   Number of children: 1   Years of education: Not on file   Highest education level: Not on  file  Occupational History   Occupation: retired  Tobacco Use   Smoking status: Never   Smokeless tobacco: Never  Vaping Use   Vaping Use: Never used  Substance and Sexual Activity   Alcohol use: Yes    Alcohol/week: 2.0 standard drinks of alcohol  Types: 2 Glasses of wine per week    Comment: occasionally   Drug use: No   Sexual activity: Not Currently  Other Topics Concern   Not on file  Social History Narrative   Daughter lives nearby, granddaughter lives in [place] - married 3 times, all deceased.  Has not been in contact with her sister in >20 years   Social Determinants of Health   Financial Resource Strain: Low Risk  (03/14/2022)   Overall Financial Resource Strain (CARDIA)    Difficulty of Paying Living Expenses: Not hard at all  Food Insecurity: No Food Insecurity (10/11/2021)   Hunger Vital Sign    Worried About Running Out of Food in the Last Year: Never true    Ran Out of Food in the Last Year: Never true  Transportation Needs: No Transportation Needs (03/14/2022)   PRAPARE - Administrator, Civil Service (Medical): No    Lack of Transportation (Non-Medical): No  Physical Activity: Insufficiently Active (10/11/2021)   Exercise Vital Sign    Days of Exercise per Week: 2 days    Minutes of Exercise per Session: 10 min  Stress: No Stress Concern Present (10/11/2021)   [employer] of Occupational Health - Occupational Stress Questionnaire    Feeling of Stress : Not at all  Social Connections: Unknown (10/11/2021)   Social Connection and Isolation Panel [NHANES]    Frequency of Communication with Friends and Family: More than three times a week    Frequency of Social Gatherings with Friends and Family: More than three times a week    Attends Religious Services: Not on file    Active Member of Clubs or Organizations: No    Attends Banker Meetings: Never    Marital Status: Widowed    Objective:  There were no vitals taken for this  visit.     07/25/2022   11:19 AM 07/03/2022   12:02 PM 07/03/2022    8:49 AM  BP/Weight  Systolic BP 148 132 152  Diastolic BP 62 56 83  Wt. (Lbs) 141    BMI 27.91 kg/m2      Physical Exam Vitals reviewed.  Constitutional:      Appearance: Normal appearance. She is normal weight.  Neck:     Vascular: No carotid bruit.  Cardiovascular:     Rate and Rhythm: Normal rate and regular rhythm.     Heart sounds: Normal heart sounds.  Pulmonary:     Effort: Pulmonary effort is normal. No respiratory distress.     Breath sounds: Normal breath sounds.  Abdominal:     General: Abdomen is flat. Bowel sounds are normal.     Palpations: Abdomen is soft.     Tenderness: There is no abdominal tenderness.  Neurological:     Mental Status: She is alert and oriented to person, place, and time.  Psychiatric:        Mood and Affect: Mood normal.        Behavior: Behavior normal.     Diabetic Foot Exam - Simple   No data filed      Lab Results  Component Value Date   WBC 6.7 07/25/2022   HGB 10.4 (L) 07/25/2022   HCT 31.3 (L) 07/25/2022   PLT 404 07/25/2022   GLUCOSE 82 07/25/2022   CHOL 171 07/25/2022   TRIG 71 07/25/2022   HDL 72 07/25/2022   LDLCALC 86 07/25/2022   ALT 14 07/25/2022   AST 20 07/25/2022   NA  137 07/25/2022   K 4.2 07/25/2022   CL 99 07/25/2022   CREATININE 0.90 07/25/2022   BUN 21 07/25/2022   CO2 18 (L) 07/25/2022   TSH 2.560 07/25/2022   INR 1.02 01/01/2015      Assessment & Plan:    There are no diagnoses linked to this encounter.   No orders of the defined types were placed in this encounter.   No orders of the defined types were placed in this encounter.    Follow-up: No follow-ups on file.   I,Khole Branch,acting as a Neurosurgeon for Blane Ohara, MD.,have documented all relevant documentation on the behalf of Blane Ohara, MD,as directed by  Blane Ohara, MD while in the presence of Blane Ohara, MD.   An After Visit Summary was printed and given  to the patient.  Blane Ohara, MD [facility] [phone]

## 2022-10-02 ENCOUNTER — Encounter: Payer: Self-pay | Admitting: Family Medicine

## 2022-10-02 ENCOUNTER — Ambulatory Visit (INDEPENDENT_AMBULATORY_CARE_PROVIDER_SITE_OTHER): Payer: Medicare HMO | Admitting: Family Medicine

## 2022-10-02 VITALS — BP 134/74 | HR 82 | Temp 97.2°F | Ht 59.6 in | Wt 143.0 lb

## 2022-10-02 DIAGNOSIS — I10 Essential (primary) hypertension: Secondary | ICD-10-CM | POA: Diagnosis not present

## 2022-10-02 DIAGNOSIS — R7309 Other abnormal glucose: Secondary | ICD-10-CM

## 2022-10-02 DIAGNOSIS — R413 Other amnesia: Secondary | ICD-10-CM | POA: Diagnosis not present

## 2022-10-02 DIAGNOSIS — M810 Age-related osteoporosis without current pathological fracture: Secondary | ICD-10-CM

## 2022-10-02 DIAGNOSIS — K529 Noninfective gastroenteritis and colitis, unspecified: Secondary | ICD-10-CM | POA: Diagnosis not present

## 2022-10-02 DIAGNOSIS — E782 Mixed hyperlipidemia: Secondary | ICD-10-CM

## 2022-10-02 HISTORY — DX: Age-related osteoporosis without current pathological fracture: M81.0

## 2022-10-02 HISTORY — DX: Other abnormal glucose: R73.09

## 2022-10-02 NOTE — Assessment & Plan Note (Signed)
Check dexa.  

## 2022-10-02 NOTE — Assessment & Plan Note (Addendum)
Patient is doing very well. She has age appropriate memory issues. Work up was negative.  Based on my evaluation, I do not feel she needs assisted living.  If patient wishes to go to an assisted living, I will happily fill out an FL2 form. She may benefit from senior apartments, such as [employer] [facility], but this does not require a form.

## 2022-10-02 NOTE — Assessment & Plan Note (Signed)
Check a1c 

## 2022-10-02 NOTE — Assessment & Plan Note (Signed)
Referring to [facility] for diarrhea. May use otc immodium.

## 2022-10-02 NOTE — Assessment & Plan Note (Signed)
Well controlled.  No changes to medicines.  Continue lipitor 40 mg before bed.  Continue to work on eating a healthy diet and exercise.  Labs drawn today.   

## 2022-10-02 NOTE — Patient Instructions (Signed)
Patient is doing very well.  Based on my evaluation, I do not feel she needs assisted living.  If patient wishes to go to an assisted living, I will happily fill out an FL2 form. She may benefit from senior apartments, such as [employer] [facility], but this does not require a form.   I have ordered a bone density.   Referring to [facility] for diarrhea. May use otc immodium.

## 2022-10-02 NOTE — Telephone Encounter (Signed)
Appt 10/02/2022.

## 2022-10-03 ENCOUNTER — Other Ambulatory Visit: Payer: Self-pay

## 2022-10-03 ENCOUNTER — Telehealth: Payer: Self-pay

## 2022-10-03 DIAGNOSIS — J4540 Moderate persistent asthma, uncomplicated: Secondary | ICD-10-CM

## 2022-10-03 LAB — COMPREHENSIVE METABOLIC PANEL
ALT: 17 IU/L (ref 0–32)
AST: 23 IU/L (ref 0–40)
Albumin/Globulin Ratio: 1.8 (ref 1.2–2.2)
Albumin: 4.2 g/dL (ref 3.8–4.8)
Alkaline Phosphatase: 111 IU/L (ref 44–121)
BUN/Creatinine Ratio: 21 (ref 12–28)
BUN: 22 mg/dL (ref 8–27)
Bilirubin Total: <0.2 mg/dL (ref 0.0–1.2)
CO2: 21 mmol/L (ref 20–29)
Calcium: 9.9 mg/dL (ref 8.7–10.3)
Chloride: 100 mmol/L (ref 96–106)
Creatinine, Ser: 1.03 mg/dL — ABNORMAL HIGH (ref 0.57–1.00)
Globulin, Total: 2.4 g/dL (ref 1.5–4.5)
Glucose: 91 mg/dL (ref 70–99)
Potassium: 3.9 mmol/L (ref 3.5–5.2)
Sodium: 138 mmol/L (ref 134–144)
Total Protein: 6.6 g/dL (ref 6.0–8.5)
eGFR: 55 mL/min/{1.73_m2} — ABNORMAL LOW (ref >59)

## 2022-10-03 LAB — CBC WITH DIFFERENTIAL/PLATELET
Basophils Absolute: 0.1 10*3/uL (ref 0.0–0.2)
Basos: 1 %
EOS (ABSOLUTE): 0.4 10*3/uL (ref 0.0–0.4)
Eos: 5 %
Hematocrit: 34.2 % (ref 34.0–46.6)
Hemoglobin: 11.4 g/dL (ref 11.1–15.9)
Immature Grans (Abs): 0 10*3/uL (ref 0.0–0.1)
Immature Granulocytes: 0 %
Lymphocytes Absolute: 1.3 10*3/uL (ref 0.7–3.1)
Lymphs: 17 %
MCH: 29.2 pg (ref 26.6–33.0)
MCHC: 33.3 g/dL (ref 31.5–35.7)
MCV: 88 fL (ref 79–97)
Monocytes Absolute: 0.6 10*3/uL (ref 0.1–0.9)
Monocytes: 8 %
Neutrophils Absolute: 5.3 10*3/uL (ref 1.4–7.0)
Neutrophils: 69 %
Platelets: 376 10*3/uL (ref 150–450)
RBC: 3.9 x10E6/uL (ref 3.77–5.28)
RDW: 13.7 % (ref 11.7–15.4)
WBC: 7.7 10*3/uL (ref 3.4–10.8)

## 2022-10-03 LAB — LIPID PANEL
Chol/HDL Ratio: 2 ratio (ref 0.0–4.4)
Cholesterol, Total: 174 mg/dL (ref 100–199)
HDL: 86 mg/dL (ref >39)
LDL Chol Calc (NIH): 77 mg/dL (ref 0–99)
Triglycerides: 54 mg/dL (ref 0–149)
VLDL Cholesterol Cal: 11 mg/dL (ref 5–40)

## 2022-10-03 LAB — CARDIOVASCULAR RISK ASSESSMENT

## 2022-10-03 LAB — HEMOGLOBIN A1C
Est. average glucose Bld gHb Est-mCnc: 120 mg/dL
Hgb A1c MFr Bld: 5.8 % — ABNORMAL HIGH (ref 4.8–5.6)

## 2022-10-03 MED ORDER — BUDESONIDE-FORMOTEROL FUMARATE 80-4.5 MCG/ACT IN AERO: INHALATION_SPRAY | RESPIRATORY_TRACT | 6 refills | Status: DC

## 2022-10-03 NOTE — Telephone Encounter (Signed)
Patient's daughter called stating that she was concerned after speaking with Shannon Martin after her appointment that she had with [place] yesterday, she was requesting to speak with the provider personally about the conversation that provider and patient had. She states that her mother Shannon Martin stated that she was asked if she was ready to go to a facility where she would no longer be able to get out and would not be independent and would not be able to come and go as she pleases. She states that she made it sound as if she was being locked away and the key thrown away.  She was requesting that the provider give her a call back. Please advise.

## 2022-10-03 NOTE — Progress Notes (Signed)
Blood count normal.  Liver function normal.  Kidney function abnormal. Slightly abnormal but fairly stable. Thyroid function normal.  Cholesterol: good HBA1C: 5.8.

## 2022-10-05 ENCOUNTER — Telehealth: Payer: Self-pay

## 2022-10-05 NOTE — Telephone Encounter (Signed)
I had a long discussion with Shannon Martin's daughter.  They are looking at [facility] and [place].  In this more likely senior apartments.  I clarified some confusion.  If any forms are required I am happy to fill this out.

## 2022-10-05 NOTE — Progress Notes (Signed)
Care Management & Coordination Services Pharmacy Team  Reason for Encounter: Medication coordination and delivery  Contacted patient to discuss medications and coordinate delivery from [facility]. Spoke with patient on 10/05/2022  Cycle dispensing form sent to Billee C[employer] for review.   Last adherence delivery date: 09-18-2022  Patient is due for next adherence delivery on:  10-17-2022  This delivery to include: Vials  30 Days  Celebrex 100 mg twice daily Atorvastatin 40mg  1 daily   Flomax 0.4 mg twice daily Eliquis 5mg  twice daily  Budesonide-formoterol 80-4. - INHALE 2 PUFFS INTO LUNGS TWICE DAILY, IN THE MORNING AND EVENING- To be sent out 10-06-2022 acute form submitted  Famotidine 40mg  twice daily   Dicyclomine 20mg -1 tab 4x daily     Patient declined the following medications this month: Artificial Tears-OTC  Claritin- OTC Estradiol 0.1mg  Vaginal Cream- Plenty of supply Hydrocortisone 2.5% Cream-Plenty of Supply Gemtesa- Due in May Flonase- Plenty supply  No refill request needed.  Confirmed delivery date of 10-17-2022, advised patient that pharmacy will contact them the morning of delivery.   Any concerns about your medications? No  How often do you forget or accidentally miss a dose? Never  Do you use a pillbox? Yes  Is patient in packaging No   Recent blood pressure readings are as follows: Patient wasn't around log but stated BP has been running around 134/80.  Chart review: Recent office visits:  10-02-2022 Blane Ohara, MD. Creatinine= 1.03, eGFR= 55. A1C= 5.8. Referral placed to gastro.  Recent consult visits:  None  Hospital visits:  None in previous 6 months  Medications: Outpatient Encounter Medications as of 10/05/2022  Medication Sig   apixaban (ELIQUIS) 2.5 MG TABS tablet Take 1 tablet (2.5 mg total) by mouth 2 (two) times daily for 1 dose.   apixaban (ELIQUIS) 5 MG TABS tablet Take 1 tablet (5 mg total) by mouth 2 (two) times  daily.   atorvastatin (LIPITOR) 40 MG tablet Take 1 tablet (40 mg total) by mouth daily.   B Complex-C-Calcium (B-COMPLEX/VITAMIN C, W/ CA, PO) Take by mouth.   budesonide-formoterol (SYMBICORT) 80-4.5 MCG/ACT inhaler INHALE 2 PUFFS INTO LUNGS TWICE DAILY, IN THE MORNING AND EVENING   celecoxib (CELEBREX) 100 MG capsule Take 100 mg by mouth 2 (two) times daily.   dicyclomine (BENTYL) 20 MG tablet TAKE ONE TABLET BY MOUTH FOUR TIMES DAILY BEFORE MEALS AND AT BEDTIME Strength: 20 mg   estradiol (ESTRACE) 0.1 MG/GM vaginal cream Place 1 Applicatorful vaginally at bedtime.   famotidine (PEPCID) 40 MG tablet TAKE 1 TABLET(40 MG) BY MOUTH TWICE DAILY   fluticasone (FLONASE) 50 MCG/ACT nasal spray Can use one spray in each nostril once daily as directed.   GEMTESA 75 MG TABS Take 1 tablet by mouth daily.   hydrocortisone (ANUSOL-HC) 2.5 % rectal cream Place 1 Application rectally 2 (two) times daily.   Hypromellose (ARTIFICIAL TEARS OP) Apply 1 drop to eye as needed for dry eyes.   loratadine (CLARITIN) 10 MG tablet Take 10 mg by mouth daily.   Multiple Vitamins-Minerals (HAIR SKIN & NAILS PO) Take by mouth.   Multiple Vitamins-Minerals (MULTIVITAMIN PO) Take 1 tablet by mouth daily.   tamsulosin (FLOMAX) 0.4 MG CAPS capsule Take 0.4 mg by mouth daily.   VITAMIN E PO Take 400 Units by mouth 2 (two) times daily.   No facility-administered encounter medications on file as of 10/05/2022.   BP Readings from Last 3 Encounters:  10/02/22 134/74  07/25/22 (!) 148/62  07/03/22 (!) 132/56  Pulse Readings from Last 3 Encounters:  10/02/22 82  07/25/22 77  07/03/22 96    Lab Results  Component Value Date/Time   HGBA1C 5.8 (H) 10/02/2022 09:08 AM   Lab Results  Component Value Date   CREATININE 1.03 (H) 10/02/2022   BUN 22 10/02/2022   GFRNONAA 55 (L) 07/03/2022   GFRAA 59 (L) 03/09/2020   NA 138 10/02/2022   K 3.9 10/02/2022   CALCIUM 9.9 10/02/2022   CO2 21 10/02/2022    Malecca Hicks  [place] Clinical Pharmacist Assistant [phone]

## 2022-10-10 ENCOUNTER — Telehealth: Payer: Self-pay

## 2022-10-10 NOTE — Progress Notes (Signed)
Followed up with daughter, Olegario Messier and they want all their meds transferred to [facility] in [place]. They were not happy with things and confirmed they wanted to switch. [facility] has been made aware.   Roxana Hires, CMA Clinical Pharmacist Assistant  [phone]

## 2022-10-13 DIAGNOSIS — E559 Vitamin D deficiency, unspecified: Secondary | ICD-10-CM | POA: Diagnosis not present

## 2022-10-13 DIAGNOSIS — R5383 Other fatigue: Secondary | ICD-10-CM | POA: Diagnosis not present

## 2022-10-13 DIAGNOSIS — M810 Age-related osteoporosis without current pathological fracture: Secondary | ICD-10-CM | POA: Diagnosis not present

## 2022-10-16 ENCOUNTER — Ambulatory Visit (INDEPENDENT_AMBULATORY_CARE_PROVIDER_SITE_OTHER): Payer: Medicare HMO

## 2022-10-16 ENCOUNTER — Telehealth: Payer: Self-pay

## 2022-10-16 VITALS — BP 132/74 | HR 85 | Resp 16 | Ht 59.5 in | Wt 142.0 lb

## 2022-10-16 DIAGNOSIS — R4589 Other symptoms and signs involving emotional state: Secondary | ICD-10-CM | POA: Diagnosis not present

## 2022-10-16 DIAGNOSIS — Z5889 Other problems related to physical environment: Secondary | ICD-10-CM | POA: Diagnosis not present

## 2022-10-16 DIAGNOSIS — Z0000 Encounter for general adult medical examination without abnormal findings: Secondary | ICD-10-CM | POA: Diagnosis not present

## 2022-10-16 NOTE — Telephone Encounter (Signed)
Patient came in today to discuss her changing her pharmacy. Patient stated she has had some problems with [facility] delivering her medication to the wrong address or not giving her a day supply. Patient would like to use [facility] in [place] for now on.

## 2022-10-16 NOTE — Progress Notes (Signed)
Subjective:   Shannon Martin is a 82 y.o. female who presents for Medicare Annual (Subsequent) preventive examination.  This wellness visit is conducted by a nurse.  The patient's medications were reviewed and reconciled since the patient's last visit.  History details were provided by the patient.  The history appears to be reliable.    Medical History: Patient history and Family history was reviewed  Medications, Allergies, and preventative health maintenance was reviewed and updated.  Cardiac Risk Factors include: advanced age (>29men, >48 women)     Objective:    Today's Vitals   10/16/22 1403  BP: 132/74  Pulse: 85  Resp: 16  SpO2: 96%  Weight: 142 lb (64.4 kg)  Height: 4' 11.5" (1.511 m)  PainSc: 0-No pain   Body mass index is 28.2 kg/m.     07/03/2022   12:02 PM 06/30/2022    9:04 AM 10/11/2021    9:33 AM 10/05/2020   10:08 AM 01/12/2015    3:02 PM 01/01/2015    1:09 PM  Advanced Directives  Does Patient Have a Medical Advance Directive? No No Yes Yes Yes Yes  Type of Surveyor, minerals;Living will Healthcare Power of [employer] of [place];Living will Healthcare Power of [place];Living will  Does patient want to make changes to medical advance directive?   No - Patient declined No - Patient declined No - Patient declined   Copy of Healthcare Power of Attorney in Chart?   No - copy requested No - copy requested Yes Yes  Would patient like information on creating a medical advance directive? No - Patient declined         Current Medications (verified) Outpatient Encounter Medications as of 10/16/2022  Medication Sig   ALPRAZolam (XANAX) 0.25 MG tablet Take by mouth.   apixaban (ELIQUIS) 5 MG TABS tablet Take 1 tablet (5 mg total) by mouth 2 (two) times daily.   atorvastatin (LIPITOR) 40 MG tablet Take 1 tablet (40 mg total) by mouth daily.   B Complex-C-Calcium (B-COMPLEX/VITAMIN C, W/ CA, PO) Take by mouth.    budesonide-formoterol (SYMBICORT) 80-4.5 MCG/ACT inhaler INHALE 2 PUFFS INTO LUNGS TWICE DAILY, IN THE MORNING AND EVENING   celecoxib (CELEBREX) 100 MG capsule Take 100 mg by mouth 2 (two) times daily.   dicyclomine (BENTYL) 20 MG tablet TAKE ONE TABLET BY MOUTH FOUR TIMES DAILY BEFORE MEALS AND AT BEDTIME Strength: 20 mg   estradiol (ESTRACE) 0.1 MG/GM vaginal cream Place 1 Applicatorful vaginally at bedtime.   famotidine (PEPCID) 40 MG tablet TAKE 1 TABLET(40 MG) BY MOUTH TWICE DAILY   fluticasone (FLONASE) 50 MCG/ACT nasal spray Can use one spray in each nostril once daily as directed.   GEMTESA 75 MG TABS Take 1 tablet by mouth daily.   hydrocortisone (ANUSOL-HC) 2.5 % rectal cream Place 1 Application rectally 2 (two) times daily.   Hypromellose (ARTIFICIAL TEARS OP) Apply 1 drop to eye as needed for dry eyes.   loratadine (CLARITIN) 10 MG tablet Take 10 mg by mouth daily.   Multiple Vitamins-Minerals (HAIR SKIN & NAILS PO) Take by mouth.   Multiple Vitamins-Minerals (MULTIVITAMIN PO) Take 1 tablet by mouth daily.   tamsulosin (FLOMAX) 0.4 MG CAPS capsule Take 0.4 mg by mouth daily.   VITAMIN E PO Take 400 Units by mouth 2 (two) times daily.   apixaban (ELIQUIS) 2.5 MG TABS tablet Take 1 tablet (2.5 mg total) by mouth 2 (two) times daily for 1 dose.  No facility-administered encounter medications on file as of 10/16/2022.    Allergies (verified) Tetanus toxoids, Bee venom, Hornet venom, Dilaudid [hydromorphone], Erythromycin, Fiorinal [butalbital-aspirin-caffeine], Amoxicillin, Doxycycline, Latex, Neomycin, and Penicillins   History: 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   GERD (gastroesophageal reflux disease)    Iliotibial band syndrome of left side 06/17/2015   Laryngopharyngeal reflux (LPR) 03/24/2015    Memory changes 09/16/2021   Migraines 1943 to present   "fairly frequently"; Takes Propranolol (01/12/2015)   Mixed hyperlipidemia 12/11/2018   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   Overactive bladder    Takes Vesicare   PAF (paroxysmal atrial fibrillation) 05/04/2021   Palpitations 04/08/2021   Seborrheic dermatitis 12/21/2020   Past Surgical History:  Procedure Laterality Date   BREAST BIOPSY Left ~ 1973   CATARACT EXTRACTION W/ INTRAOCULAR LENS  IMPLANT, BILATERAL Bilateral 2003   CHEST TUBE INSERTION  January 2015   [facility]   DILATION AND CURETTAGE OF UTERUS  4-5   JOINT REPLACEMENT     REPLACEMENT TOTAL KNEE Left    TONSILLECTOMY AND ADENOIDECTOMY  1946; 1947   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   Procedure: TOTAL HIP ARTHROPLASTY;  Surgeon: Joen Laura, MD;  Location: [facility] OR;  Service: Orthopedics;  Laterality: Left;   TOTAL KNEE ARTHROPLASTY Left 01/11/2015   TOTAL KNEE ARTHROPLASTY Left 01/11/2015   Procedure: Left TOTAL KNEE ARTHROPLASTY;  Surgeon: Dannielle Huh, MD;  Location: [facility] OR;  Service: Orthopedics;  Laterality: Left;   VIDEO ASSISTED THORACOSCOPY (VATS)/THOROCOTOMY Left 07/13/2013   VATS with insertion of chest tubes   Family History  Problem Relation Age of Onset   Hypertension Mother    Diabetes Father    Hypertension Maternal Grandmother    Hypertension Maternal Grandfather    Social History   Socioeconomic History   Marital status: Widowed    Spouse name: Not on file   Number of children: 1   Years of education: Not on file   Highest education level: Not on file  Occupational History   Occupation: retired  Tobacco Use   Smoking status: Never   Smokeless tobacco: Never  Vaping Use   Vaping Use: Never used  Substance and Sexual Activity   Alcohol use: Yes    Alcohol/week: 2.0 standard drinks of alcohol    Types: 2 Glasses of wine per week    Comment: occasionally   Drug use: No   Sexual activity: Not Currently  Other  Topics Concern   Not on file  Social History Narrative   Daughter lives nearby, granddaughter lives in [place] - married 3 times, all deceased.  Has not been in contact with her sister in >20 years   Social Determinants of Health   Financial Resource Strain: Low Risk  (03/14/2022)   Overall Financial Resource Strain (CARDIA)    Difficulty of Paying Living Expenses: Not hard at all  Food Insecurity: No Food Insecurity (10/16/2022)   Hunger Vital Sign    Worried About Running Out of Food in the Last Year: Never true    Ran Out of Food in the Last Year: Never true  Transportation Needs: No Transportation Needs (10/16/2022)   PRAPARE - Administrator, Civil Service (Medical): No    Lack of Transportation (Non-Medical): No  Physical Activity: Insufficiently Active (10/16/2022)   Exercise Vital Sign  Days of Exercise per Week: 2 days    Minutes of Exercise per Session: 30 min  Stress: No Stress Concern Present (10/16/2022)   [employer] of Occupational Health - Occupational Stress Questionnaire    Feeling of Stress : Only a little  Social Connections: Unknown (10/11/2021)   Social Connection and Isolation Panel [NHANES]    Frequency of Communication with Friends and Family: More than three times a week    Frequency of Social Gatherings with Friends and Family: More than three times a week    Attends Religious Services: Not on file    Active Member of Clubs or Organizations: No    Attends Banker Meetings: Never    Marital Status: Widowed    Tobacco Counseling Counseling given: Not Answered   Clinical Intake:  Pre-visit preparation completed: Yes Pain : No/denies pain Pain Score: 0-No pain   BMI - recorded: 28.2 Nutritional Risks: None Diabetes: No How often do you need to have someone help you when you read instructions, pamphlets, or other written materials from your doctor or pharmacy?: 2 - Rarely Interpreter Needed?: No     Activities of  Daily Living    10/16/2022    2:36 PM 10/02/2022    8:17 AM  In your present state of health, do you have any difficulty performing the following activities:  Hearing? 0 0  Vision? 0 0  Difficulty concentrating or making decisions? 0 0  Walking or climbing stairs? 0 0  Dressing or bathing? 0 0  Doing errands, shopping? 0 0  Preparing Food and eating ? N   Using the Toilet? N   In the past six months, have you accidently leaked urine? N   Do you have problems with loss of bowel control? N   Managing your Medications? N   Managing your Finances? N   Housekeeping or managing your Housekeeping? N     Patient Care Team: Blane Ohara, MD as PCP - General (Family Medicine) Prescilla Sours, FNP as Nurse Practitioner (Family Medicine) Zettie Pho, [facility] as Pharmacist (Pharmacist)      Assessment:   This is a routine wellness examination for [facility].  Hearing/Vision screen No results found.  Dietary issues and exercise activities discussed: Current Exercise Habits: Home exercise routine, Type of exercise: walking;strength training/weights, Time (Minutes): 30, Frequency (Times/Week): 3, Weekly Exercise (Minutes/Week): 90, Intensity: Mild, Exercise limited by: None identified  Depression Screen    10/16/2022    2:09 PM 10/02/2022    8:16 AM 03/28/2022    9:07 AM 10/11/2021    9:28 AM 10/04/2021   10:39 AM 09/16/2021    8:44 AM 03/18/2021    9:02 AM  PHQ 2/9 Scores  PHQ - 2 Score 0 0 0 0 0 0 0  PHQ- 9 Score   3 0  0 0    Fall Risk    10/16/2022    2:36 PM 10/02/2022    8:16 AM 07/25/2022   11:24 AM 10/11/2021    9:33 AM 10/04/2021   10:38 AM  Fall Risk   Falls in the past year? 1 1 1  0 0  Number falls in past yr: 1 0 0 0 0  Injury with Fall? 1 1 1  0 0  Risk for fall due to : History of fall(s) No Fall Risks History of fall(s) No Fall Risks   Follow up Falls evaluation completed;Education provided Falls evaluation completed Falls evaluation completed;Education provided Falls  evaluation completed;Falls prevention discussed  FALL RISK PREVENTION PERTAINING TO THE HOME:  Any stairs in or around the home? No  If so, are there any without handrails? No  Home free of loose throw rugs in walkways, pet beds, electrical cords, etc? Yes  Adequate lighting in your home to reduce risk of falls? Yes   ASSISTIVE DEVICES UTILIZED TO PREVENT FALLS:  Life alert? No  Use of a cane, walker or w/c? No  Grab bars in the bathroom? Yes  Shower chair or bench in shower? No  Elevated toilet seat or a handicapped toilet? No   Gait slow and steady without use of assistive device  Cognitive Function:    07/25/2022   11:58 AM 09/16/2021    9:29 AM  MMSE - Mini Mental State Exam  Orientation to time 5 4  Orientation to Place 5 5  Registration 3 3  Attention/ Calculation 5 5  Recall 3 3  Language- name 2 objects 2 2  Language- repeat 1 1  Language- follow 3 step command 3 3  Language- read & follow direction 1 1  Write a sentence 1 1  Copy design 1 1  Total score 30 29        10/16/2022    2:37 PM 10/05/2020   10:39 AM  6CIT Screen  What Year? 0 points 0 points  What month? 0 points 0 points  What time? 0 points 0 points  Count back from 20 2 points 0 points  Months in reverse 2 points 2 points  Repeat phrase 2 points 0 points  Total Score 6 points 2 points    Immunizations Immunization History  Administered Date(s) Administered   Fluad Quad(high Dose 65+) 05/07/2020, 04/20/2021, 04/25/2022   Influenza,inj,Quad PF,6+ Mos 03/28/2019   Moderna Covid-19 Vaccine Bivalent Booster 59yrs & up 03/23/2022   PFIZER(Purple Top)SARS-COV-2 Vaccination 08/29/2019, 09/26/2019, 03/31/2020, 10/18/2020   PNEUMOCOCCAL CONJUGATE-20 09/16/2021   Pfizer Covid-19 Vaccine Bivalent Booster 59yrs & up 04/05/2021   Pneumococcal Conjugate-13 03/15/2014   Pneumococcal-Unspecified 09/09/2013   Respiratory Syncytial Virus Vaccine,Recomb Aduvanted(Arexvy) 05/08/2022   Zoster  Recombinat (Shingrix) 09/16/2021, 12/30/2021    Flu Vaccine status: Up to date  Pneumococcal vaccine status: Up to date  Covid-19 vaccine status: Completed vaccines  Qualifies for Shingles Vaccine? Yes   Zostavax completed No   Shingrix Completed?: Yes  Screening Tests Health Maintenance  Topic Date Due   DTaP/Tdap/Td (1 - Tdap) Never done   COVID-19 Vaccine (7 - 2023-24 season) 05/18/2022   INFLUENZA VACCINE  01/25/2023   Medicare Annual Wellness (AWV)  10/16/2023   Pneumonia Vaccine 70+ Years old  Completed   DEXA SCAN  Completed   Zoster Vaccines- Shingrix  Completed   HPV VACCINES  Aged Out    Health Maintenance  Health Maintenance Due  Topic Date Due   DTaP/Tdap/Td (1 - Tdap) Never done   COVID-19 Vaccine (7 - 2023-24 season) 05/18/2022    Colorectal cancer screening: No longer required.   Mammogram status: No longer required due to patient declined.  Bone Density status: Completed 10/19/20. Results reflect: Bone density results: OSTEOPENIA. Repeat every 2 years.  Lung Cancer Screening: (Low Dose CT Chest recommended if Age 55-80 years, 30 pack-year currently smoking OR have quit w/in 15years.) does not qualify.   Additional Screening:  Vision Screening: Recommended annual ophthalmology exams for early detection of glaucoma and other disorders of the eye. Is the patient up to date with their annual eye exam?  Yes  Who is the provider or what  is the name of the office in which the patient attends annual eye exams? [facility]  Dental Screening: Recommended annual dental exams for proper oral hygiene     Plan:    1- Patient states that she has an upcoming DEXA scheduled for May 3 at [facility] - she had a hip fracture earlier this year  2- Patient is currently looking for a new apartment and is having trouble finding one that is affordable.  She does not feel safe where she lives now and stays with her daughter most nights. Patient states that she frequently  hears her door-knob jingle at night from people coming by to see if it is locked.  Patient also states that tenants are not allowed to have a security alarm or add extra locks to the door.  Patient states that her only door lock is an older push button lock.  I have personally reviewed and noted the following in the patient's chart:   Medical and social history Use of alcohol, tobacco or illicit drugs  Current medications and supplements including opioid prescriptions. Patient is not currently taking opioid prescriptions. Functional ability and status Nutritional status Physical activity Advanced directives List of other physicians Hospitalizations, surgeries, and ER visits in previous 12 months Vitals Screenings to include cognitive, depression, and falls Referrals and appointments  In addition, I have reviewed and discussed with patient certain preventive protocols, quality metrics, and best practice recommendations. A written personalized care plan for preventive services as well as general preventive health recommendations were provided to patient.     Jacklynn Bue, LPN   2/53/[id number]

## 2022-10-16 NOTE — Patient Instructions (Signed)
Shannon Martin , Thank you for taking time to come for your Medicare Wellness Visit. I appreciate your ongoing commitment to your health goals. Please review the following plan we discussed and let me know if I can assist you in the future.   Screening recommendations/referrals: Bone Density: Scheduled for 10/27/22 Recommended yearly ophthalmology/optometry visit for glaucoma screening and checkup Recommended yearly dental visit for hygiene and checkup  Vaccinations: Influenza vaccine: Due in the fall Pneumococcal vaccine: Complete Shingles vaccine: Complete      Preventive Care 65 Years and Older, Female   Preventive care refers to lifestyle choices and visits with your health care provider that can promote health and wellness.  What does preventive care include? A yearly physical exam. This is also called an annual well check. Dental exams once or twice a year. Routine eye exams. Ask your health care provider how often you should have your eyes checked. Personal lifestyle choices, including: Daily care of your teeth and gums. Regular physical activity. Eating a healthy diet. Avoiding tobacco and drug use. Limiting alcohol use. Practicing safe sex. Taking low-dose aspirin every day. Taking vitamin and mineral supplements as recommended by your health care provider.  What happens during an annual well check? The services and screenings done by your health care provider during your annual well check will depend on your age, overall health, lifestyle risk factors, and family history of disease.  Counseling Your health care provider may ask you questions about your: Alcohol use. Tobacco use. Drug use. Emotional well-being. Home and relationship well-being. Sexual activity. Eating habits. History of falls. Memory and ability to understand (cognition). Work and work Astronomer. Reproductive health.  Screening You may have the following tests or measurements: Height, weight,  and BMI. Blood pressure. Lipid and cholesterol levels. These may be checked every 5 years, or more frequently if you are over 81 years old. Skin check. Lung cancer screening. You may have this screening every year starting at age 81 if you have a 30-pack-year history of smoking and currently smoke or have quit within the past 15 years. Fecal occult blood test (FOBT) of the stool. You may have this test every year starting at age 81. Flexible sigmoidoscopy or colonoscopy. You may have a sigmoidoscopy every 5 years or a colonoscopy every 10 years starting at age 81. Hepatitis C blood test. Hepatitis B blood test. Sexually transmitted disease (STD) testing. Diabetes screening. This is done by checking your blood sugar (glucose) after you have not eaten for a while (fasting). You may have this done every 1-3 years. Bone density scan. This is done to screen for osteoporosis. You may have this done starting at age 81. Mammogram. This may be done every 1-2 years. Talk to your health care provider about how often you should have regular mammograms. Talk with your health care provider about your test results, treatment options, and if necessary, the need for more tests.  Vaccines Your health care provider may recommend certain vaccines, such as: Influenza vaccine. This is recommended every year. Tetanus, diphtheria, and acellular pertussis (Tdap, Td) vaccine. You may need a Td booster every 10 years. Zoster vaccine. You may need this after age 51. Pneumococcal 13-valent conjugate (PCV13) vaccine. One dose is recommended after age 8. Pneumococcal polysaccharide (PPSV23) vaccine. One dose is recommended after age 63. Talk to your health care provider about which screenings and vaccines you need and how often you need them.  This information is not intended to replace advice given to you by  your health care provider. Make sure you discuss any questions you have with your health care provider. Document  Released: 07/09/2015 Document Revised: 03/01/2016 Document Reviewed: 04/13/2015 Elsevier Interactive Patient Education  2017 [employer].   Fall Prevention in the Home  Falls can cause injuries. They can happen to people of all ages. There are many things you can do to make your home safe and to help prevent falls.  What can I do on the outside of my home? Regularly fix the edges of walkways and driveways and fix any cracks. Remove anything that might make you trip as you walk through a door, such as a raised step or threshold. Trim any bushes or trees on the path to your home. Use bright outdoor lighting. Clear any walking paths of anything that might make someone trip, such as rocks or tools. Regularly check to see if handrails are loose or broken. Make sure that both sides of any steps have handrails. Any raised decks and porches should have guardrails on the edges. Have any leaves, snow, or ice cleared regularly. Use sand or salt on walking paths during winter. Clean up any spills in your garage right away. This includes oil or grease spills.  What can I do in the bathroom? Use night lights. Install grab bars by the toilet and in the tub and shower. Do not use towel bars as grab bars. Use non-skid mats or decals in the tub or shower. If you need to sit down in the shower, use a plastic, non-slip stool. Keep the floor dry. Clean up any water that spills on the floor as soon as it happens. Remove soap buildup in the tub or shower regularly. Attach bath mats securely with double-sided non-slip rug tape. Do not have throw rugs and other things on the floor that can make you trip.  What can I do in the bedroom? Use night lights. Make sure that you have a light by your bed that is easy to reach. Do not use any sheets or blankets that are too big for your bed. They should not hang down onto the floor. Have a firm chair that has side arms. You can use this for support while you get  dressed. Do not have throw rugs and other things on the floor that can make you trip.  What can I do in the kitchen? Clean up any spills right away. Avoid walking on wet floors. Keep items that you use a lot in easy-to-reach places. If you need to reach something above you, use a strong step stool that has a grab bar. Keep electrical cords out of the way. Do not use floor polish or wax that makes floors slippery. If you must use wax, use non-skid floor wax. Do not have throw rugs and other things on the floor that can make you trip.  What can I do with my stairs? Do not leave any items on the stairs. Make sure that there are handrails on both sides of the stairs and use them. Fix handrails that are broken or loose. Make sure that handrails are as long as the stairways. Check any carpeting to make sure that it is firmly attached to the stairs. Fix any carpet that is loose or worn. Avoid having throw rugs at the top or bottom of the stairs. If you do have throw rugs, attach them to the floor with carpet tape. Make sure that you have a light switch at the top of the stairs and  the bottom of the stairs. If you do not have them, ask someone to add them for you.  What else can I do to help prevent falls? Wear shoes that: Do not have high heels. Have rubber bottoms. Are comfortable and fit you well. Are closed at the toe. Do not wear sandals. If you use a stepladder: Make sure that it is fully opened. Do not climb a closed stepladder. Make sure that both sides of the stepladder are locked into place. Ask someone to hold it for you, if possible. Clearly mark and make sure that you can see: Any grab bars or handrails. First and last steps. Where the edge of each step is. Use tools that help you move around (mobility aids) if they are needed. These include: Canes. Walkers. Scooters. Crutches. Turn on the lights when you go into a dark area. Replace any light bulbs as soon as they burn  out. Set up your furniture so you have a clear path. Avoid moving your furniture around. If any of your floors are uneven, fix them. If there are any pets around you, be aware of where they are. Review your medicines with your doctor. Some medicines can make you feel dizzy. This can increase your chance of falling. Ask your doctor what other things that you can do to help prevent falls.  This information is not intended to replace advice given to you by your health care provider. Make sure you discuss any questions you have with your health care provider. Document Released: 04/08/2009 Document Revised: 11/18/2015 Document Reviewed: 07/17/2014 Elsevier Interactive Patient Education  2017 [employer].

## 2022-10-18 ENCOUNTER — Telehealth: Payer: Self-pay | Admitting: *Deleted

## 2022-10-18 NOTE — Telephone Encounter (Signed)
   Telephone encounter was:  Unsuccessful.  10/18/2022 Name: KAHEALANI YANKOVICH MRN: [id number] DOB: 09-Jul-1940  Unsuccessful outbound call made today to assist with:   Housing   Outreach Attempt:  1st Attempt  A HIPAA compliant voice message was left requesting a return call.  Instructed patient to call back at [phone]. Yehuda Mao [facility], Population Health [phone] [address] [place] , [place] [address] Email : [email or web address]

## 2022-10-19 ENCOUNTER — Telehealth: Payer: Self-pay | Admitting: *Deleted

## 2022-10-19 NOTE — Telephone Encounter (Signed)
   Telephone encounter was:  Unsuccessful.  10/19/2022 Name: Shannon Martin MRN: [id number] DOB: 1940/06/27  Unsuccessful outbound call made today to assist with:   Housing   Outreach Attempt: 2nd call   A HIPAA compliant voice message was left requesting a return call.  Instructed patient to call back at [phone]. Yehuda Mao Greenauer -[facility], Population Health [phone] [address] [place] , [place] [address] Email : [email or web address]

## 2022-10-20 ENCOUNTER — Telehealth: Payer: Self-pay | Admitting: *Deleted

## 2022-10-20 NOTE — Telephone Encounter (Signed)
   Telephone encounter was:  Unsuccessful.  10/20/2022 Name: Shannon Martin MRN: [id number] DOB: 27-Apr-1941  Unsuccessful outbound call made today to assist with:   Housing   Outreach Attempt: 3rd  A HIPAA compliant voice message was left requesting a return call.  Instructed patient to call back at [phone].  Yehuda Mao Greenauer -[facility] , Population Health [phone] [address] [place] , [place] [address] Email : [email or web address]

## 2022-10-23 ENCOUNTER — Telehealth: Payer: Self-pay | Admitting: Cardiology

## 2022-10-23 ENCOUNTER — Other Ambulatory Visit: Payer: Self-pay

## 2022-10-23 DIAGNOSIS — E782 Mixed hyperlipidemia: Secondary | ICD-10-CM

## 2022-10-23 DIAGNOSIS — I480 Paroxysmal atrial fibrillation: Secondary | ICD-10-CM

## 2022-10-23 MED ORDER — CELECOXIB 100 MG PO CAPS: 100.0000 mg | ORAL_CAPSULE | Freq: Two times a day (BID) | ORAL | 2 refills | Status: DC

## 2022-10-23 MED ORDER — ATORVASTATIN CALCIUM 40 MG PO TABS
40.0000 mg | ORAL_TABLET | Freq: Every day | ORAL | 3 refills | Status: DC
Start: 2022-10-23 — End: 2023-10-01

## 2022-10-23 MED ORDER — APIXABAN 5 MG PO TABS
5.0000 mg | ORAL_TABLET | Freq: Two times a day (BID) | ORAL | 1 refills | Status: DC
Start: 2022-10-23 — End: 2023-02-13

## 2022-10-23 NOTE — Telephone Encounter (Signed)
Patient came in requesting a refill on her eliquis 5mg  and would like the prescription to be sent to the [facility] in [place] please. CB # [phone]

## 2022-10-23 NOTE — Telephone Encounter (Signed)
Pt last saw Dr Tomie China 11/14/21, last labs 10/02/22 Creat 1.03, age 82, weight 64.4kg, based on specified criteria pt is on appropriate dosage of Eliquis 5mg  BID for afib.  Will refill rx.

## 2022-10-27 DIAGNOSIS — S72042A Displaced fracture of base of neck of left femur, initial encounter for closed fracture: Secondary | ICD-10-CM | POA: Diagnosis not present

## 2022-10-27 DIAGNOSIS — M810 Age-related osteoporosis without current pathological fracture: Secondary | ICD-10-CM | POA: Diagnosis not present

## 2022-10-30 ENCOUNTER — Encounter: Payer: Self-pay | Admitting: Family Medicine

## 2022-10-30 ENCOUNTER — Telehealth: Payer: Self-pay

## 2022-10-30 ENCOUNTER — Other Ambulatory Visit: Payer: Self-pay | Admitting: Family Medicine

## 2022-10-30 DIAGNOSIS — R4189 Other symptoms and signs involving cognitive functions and awareness: Secondary | ICD-10-CM

## 2022-10-30 MED ORDER — DONEPEZIL HCL 5 MG PO TABS
5.0000 mg | ORAL_TABLET | Freq: Every day | ORAL | 0 refills | Status: DC
Start: 2022-10-30 — End: 2022-10-30

## 2022-10-30 NOTE — Telephone Encounter (Signed)
Shannon Martin called with concerns about her Mom.  They took her Mom to the beach this weekend and around 2:30 AM her Mom called 911 and told them she did not know where she was and she thought she had been kidnapped.  She also is have delusions, seeing people and spiders on the wall.  Her daughter is very concerned.  Dr. Sedalia Muta advised that she carry her to the ED for evaluation since her symptoms seem to be much worse.  We will complete an FL2 form if needed.

## 2022-11-09 DIAGNOSIS — M810 Age-related osteoporosis without current pathological fracture: Secondary | ICD-10-CM | POA: Diagnosis not present

## 2022-11-16 DIAGNOSIS — R339 Retention of urine, unspecified: Secondary | ICD-10-CM | POA: Diagnosis not present

## 2022-11-16 DIAGNOSIS — N3281 Overactive bladder: Secondary | ICD-10-CM | POA: Diagnosis not present

## 2022-11-16 DIAGNOSIS — N390 Urinary tract infection, site not specified: Secondary | ICD-10-CM | POA: Diagnosis not present

## 2022-11-28 ENCOUNTER — Other Ambulatory Visit: Payer: Self-pay

## 2022-11-28 DIAGNOSIS — G43909 Migraine, unspecified, not intractable, without status migrainosus: Secondary | ICD-10-CM | POA: Insufficient documentation

## 2022-12-05 DIAGNOSIS — M810 Age-related osteoporosis without current pathological fracture: Secondary | ICD-10-CM | POA: Diagnosis not present

## 2022-12-08 ENCOUNTER — Ambulatory Visit: Payer: Medicare HMO | Admitting: Cardiology

## 2022-12-12 ENCOUNTER — Other Ambulatory Visit: Payer: Self-pay

## 2022-12-12 DIAGNOSIS — J4540 Moderate persistent asthma, uncomplicated: Secondary | ICD-10-CM

## 2022-12-12 MED ORDER — BUDESONIDE-FORMOTEROL FUMARATE 80-4.5 MCG/ACT IN AERO
INHALATION_SPRAY | RESPIRATORY_TRACT | 6 refills | Status: DC
Start: 2022-12-12 — End: 2023-12-05

## 2022-12-12 MED ORDER — FAMOTIDINE 40 MG PO TABS: ORAL_TABLET | ORAL | 0 refills | Status: DC

## 2022-12-19 ENCOUNTER — Other Ambulatory Visit: Payer: Self-pay

## 2022-12-19 DIAGNOSIS — K648 Other hemorrhoids: Secondary | ICD-10-CM

## 2022-12-20 MED ORDER — HYDROCORTISONE (PERIANAL) 2.5 % EX CREA
1.0000 | TOPICAL_CREAM | Freq: Two times a day (BID) | CUTANEOUS | 0 refills | Status: DC
Start: 2022-12-20 — End: 2023-12-05

## 2022-12-25 ENCOUNTER — Other Ambulatory Visit: Payer: Self-pay

## 2022-12-25 ENCOUNTER — Other Ambulatory Visit: Payer: Self-pay | Admitting: Family Medicine

## 2022-12-25 DIAGNOSIS — K580 Irritable bowel syndrome with diarrhea: Secondary | ICD-10-CM

## 2022-12-25 MED ORDER — CELECOXIB 100 MG PO CAPS: 100.0000 mg | ORAL_CAPSULE | Freq: Two times a day (BID) | ORAL | 5 refills | Status: DC

## 2022-12-25 MED ORDER — DICYCLOMINE HCL 20 MG PO TABS: ORAL_TABLET | ORAL | 3 refills | Status: DC

## 2022-12-26 ENCOUNTER — Telehealth: Payer: Self-pay | Admitting: Family Medicine

## 2022-12-26 NOTE — Telephone Encounter (Signed)
Medication changes: Decrease eliquis to 2.5 mg twice daily (due to age over 81 yo.) Discontinue celebrex. It increases risk of stomach bleeds when taken with eliquis.  Recommended tylenol for joint/back pain.   I spoke with [facility] and made changes and left detailed message on patient's voicemail.   Dr. Sedalia Muta

## 2023-01-02 ENCOUNTER — Other Ambulatory Visit: Payer: Self-pay

## 2023-01-03 ENCOUNTER — Telehealth: Payer: Self-pay

## 2023-01-03 NOTE — Telephone Encounter (Signed)
Patient called requesting refill for celebrex and per dr. Sedalia Muta the patient can't take celebrex with her Eliquis. Called and informed the patients daughter and told her NO NSAIDS at all no aleve no ibuprophen. Told her if she has pain to use diclofenec gel OTC per Dr. Sedalia Muta and she stated she understood verbally.

## 2023-01-05 ENCOUNTER — Other Ambulatory Visit: Payer: Self-pay

## 2023-01-05 MED ORDER — DONEPEZIL HCL 5 MG PO TABS: 5.0000 mg | ORAL_TABLET | Freq: Every day | ORAL | 0 refills | Status: DC

## 2023-01-08 ENCOUNTER — Other Ambulatory Visit: Payer: Self-pay | Admitting: Family Medicine

## 2023-01-09 ENCOUNTER — Telehealth: Payer: Self-pay | Admitting: Cardiology

## 2023-01-09 NOTE — Telephone Encounter (Addendum)
Eliquis 5mg  dose is appropriate dose based upon dosing criteria.  Patient is 82 years old, weight-64.4kg, Crea-1.03 on 10/02/22, Diagnosis-Afib, and last seen by Dr. Tomie China on 11/14/21.   Pt DOES NOT have 2 factors that warrants a dose decrease to her eliquis, therefore, the appropriate dose should be 5mg  twice a day. Will send to Dr. Tomie China and review with PharmD as well.

## 2023-01-09 NOTE — Telephone Encounter (Signed)
Spoke with Dtr Dulce Sellar on DPR, and advised to continue current dose of eliquis and that will update her tomorrow once PharmD addresses. She confirmed that PCP did stop the celebrex.

## 2023-01-09 NOTE — Telephone Encounter (Signed)
Pt c/o medication issue:  1. Name of Medication:   apixaban (ELIQUIS) 5 MG TABS tablet    2. How are you currently taking this medication (dosage and times per day)? She has been taking 5mg  2x daily   3. Are you having a reaction (difficulty breathing--STAT)? No   4. What is your medication issue?  Pt daughter is concerned because pt PCP has decreased medication. Pt hasn't been taking it this way yet but they want to ensure Dr. Tomie China agrees.     Blane Ohara, MD      12/26/22  5:15 PM Note Medication changes: Decrease eliquis to 2.5 mg twice daily (due to age over 70 yo.) Discontinue celebrex. It increases risk of stomach bleeds when taken with eliquis.  Recommended tylenol for joint/back pain.    I spoke with [facility] and made changes and left detailed message on patient's voicemail.    Dr. Sedalia Muta

## 2023-01-09 NOTE — Telephone Encounter (Signed)
 Daughter returned staff call.

## 2023-01-10 DIAGNOSIS — N952 Postmenopausal atrophic vaginitis: Secondary | ICD-10-CM | POA: Diagnosis not present

## 2023-01-10 DIAGNOSIS — R339 Retention of urine, unspecified: Secondary | ICD-10-CM | POA: Diagnosis not present

## 2023-01-10 DIAGNOSIS — N390 Urinary tract infection, site not specified: Secondary | ICD-10-CM | POA: Diagnosis not present

## 2023-01-10 NOTE — Telephone Encounter (Signed)
Spoke with Dulce Sellar Dtr and updated her that our Pharmacist, Baxter Hire reviewed the eliquis dose  and advised to have them continue the eliquis 5mg  twice a day due to dosing criteria. Advised if anything needs to change we will update her and she verbalized understanding.   Pt does have a bottle og eliquis 5mg  at home and no refill needed at this time.

## 2023-01-10 NOTE — Telephone Encounter (Signed)
Based on guidelines for Eliquis, patient needs to meet 2 of the 3 criteria for decreasing dose to 2.5 mg bid:  Age  > 80 SCr > 1.5 Wt < 60 kg  Patient most recent SCr is 1.03, has been stable for past few years Weight is 64.4 kg  Unless there is other information we are unaware of (bleeding issues or excessive falls), patient should remain at 5 mg dose for now.  Our practice reviews patient every 6 months to determine if dosing adjustments should be made, based on age, SCr and weight.

## 2023-02-13 ENCOUNTER — Other Ambulatory Visit: Payer: Self-pay

## 2023-02-13 ENCOUNTER — Telehealth: Payer: Self-pay | Admitting: Cardiology

## 2023-02-13 DIAGNOSIS — I480 Paroxysmal atrial fibrillation: Secondary | ICD-10-CM

## 2023-02-13 MED ORDER — APIXABAN 5 MG PO TABS
5.0000 mg | ORAL_TABLET | Freq: Two times a day (BID) | ORAL | 1 refills | Status: DC
Start: 2023-02-13 — End: 2023-05-28

## 2023-02-13 NOTE — Telephone Encounter (Signed)
*  STAT* If patient is at the pharmacy, call can be transferred to refill team.   1. Which medications need to be refilled? (please list name of each medication and dose if known) apixaban (ELIQUIS) 5 MG TABS tablet    4. Which pharmacy/location (including street and city if local pharmacy) is medication to be sent to?[facility] [phone] - [place], [place] - [address]    5. Do they need a 30 day or 90 day supply? 90

## 2023-02-13 NOTE — Telephone Encounter (Signed)
Prescription refill request for Eliquis received. Indication:afib Last office visit:upcoming Scr:1.03  4/24 Age: 82 Weight:64.4  kg  Prescription refilled

## 2023-02-19 DIAGNOSIS — N3281 Overactive bladder: Secondary | ICD-10-CM | POA: Diagnosis not present

## 2023-02-19 DIAGNOSIS — R339 Retention of urine, unspecified: Secondary | ICD-10-CM | POA: Diagnosis not present

## 2023-02-19 DIAGNOSIS — N390 Urinary tract infection, site not specified: Secondary | ICD-10-CM | POA: Diagnosis not present

## 2023-02-22 DIAGNOSIS — H5213 Myopia, bilateral: Secondary | ICD-10-CM | POA: Diagnosis not present

## 2023-02-22 DIAGNOSIS — H40053 Ocular hypertension, bilateral: Secondary | ICD-10-CM | POA: Diagnosis not present

## 2023-02-23 ENCOUNTER — Ambulatory Visit: Payer: Medicare HMO | Admitting: Internal Medicine

## 2023-03-05 ENCOUNTER — Ambulatory Visit: Payer: Medicare HMO | Admitting: Internal Medicine

## 2023-03-05 ENCOUNTER — Encounter: Payer: Self-pay | Admitting: Internal Medicine

## 2023-03-05 VITALS — BP 148/94 | HR 67 | Temp 98.2°F | Resp 17 | Ht 59.0 in | Wt 137.4 lb

## 2023-03-05 DIAGNOSIS — Z6827 Body mass index (BMI) 27.0-27.9, adult: Secondary | ICD-10-CM

## 2023-03-05 DIAGNOSIS — R413 Other amnesia: Secondary | ICD-10-CM

## 2023-03-05 DIAGNOSIS — I480 Paroxysmal atrial fibrillation: Secondary | ICD-10-CM | POA: Diagnosis not present

## 2023-03-05 NOTE — Assessment & Plan Note (Signed)
I reviewed her labs and MRI from 07/2022.  She does need a RPR tested.  I am going to do a PHQ 12 on her today and repeat a MMSE.  She has had problems with IBS and I dont think her aricept has worsened this.  We will set her up to see neurology and neuropsychology for neurocognitive assessment/testing.

## 2023-03-05 NOTE — Assessment & Plan Note (Signed)
She is not on any rate control meds.  She seems to be in sinus rhythm today.  She remains on eliquis 2.5mg  BID.

## 2023-03-05 NOTE — Progress Notes (Signed)
Office Visit  Subjective   Patient ID: MORIREOLUWA HOLLIE   DOB: 08/16/1940   Age: 82 y.o.   MRN: [id number]   Chief Complaint Chief Complaint  Patient presents with   Acute Visit   Annual Exam    New Patient     History of Present Illness The patient is a 82 yo female who comes in today to establish care.  She was previously followed by DR. Marina Goodell who retired in 05/2022 and she was subsequently followed by Danie Binder with her last visit around 09/2022.   Her daughter accompanies her today to discuss her memory issues.  She first noted that the patient began having some mild memory problems about 2 years ago.  They noticed that she had problems remembering how to drive to certain places.  She did have an accidental fall in 06/2022 where she had a left femoral hip fracture where she was hospitalized and had a THA done at that time.  The patient was sent to [facility] for short term rehab and they noted that after this event her memory was worsened.  The patient was independently living by herself before then but after [facility] she went to live with her daughter as she could not be left alone.  She became confused during a vacation in 09/2022 where she woke up and did not know where she was at and called 911.  There has been some sundowning where if she falls asleep and wakes up later at night it takes time to get her bearings or she wakes up and says something nonsensical.  She has problems with short term memories but not long term memories.  She still recognizes everyone but cannot put the right name with the right person.  The daughter states that Mrs. Labarr keeps to herself and reads on her own.  There has been no behavioral problems and there may be some anxiety but they are not sure about depression.  There is no crying episodes.  The patient states she gets up 3-4 times a night to get up to use the bathroom.  She was assessed by her previous primary care physician in 07/2022 where they did blood work and  a MMSE.  Her labs were unremarkable and her daughter states she did well with her MMSE.  They did a MRI of her brain on 08/07/2022 showed age congruent brain MRI. No specific or reversible cause for symptoms.  The patient was started on aricept 5mg  at bedtime and she states she has some diarrhea that comes and goes.  She otherwise does her other ADL's.    This patient has a history of atrial fibrillation that was diagnosed in 2020.  She is currently been seen by Dr. Josiah Lobo in cardiology where they have an appointment this coming week.  She was using a cardia mobile but has not been doing this recently.  She is not on any rate controller meds but she is on eliquis 2.5mg  BID.  Her atrial fibrillation is controlled with therapy as summarized in the medication list and previous notes. Specifically denied complaints: chest pain, SOB, palpitations, generalized weakness, syncope or TIAs. She had an ECHO done on 04/20/2021 which showed a normal systolic function with a LVEF of 60-65%.  There was no LV regional wall motion abnormalities but she did have Grade I diastolic dysfunction.  There were no problems with her valves.  She initially presented with her A. Fib where she was having syncopal episodes.  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   GERD (gastroesophageal reflux disease)    Hip fracture (HCC) 06/30/2022   IBS (irritable bowel syndrome) 03/22/2022   Iliotibial band syndrome of left side 06/17/2015   Internal hemorrhoid, bleeding 05/24/2022   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   Laryngopharyngeal  reflux (LPR) 03/24/2015   Left knee pain 10/01/2014   Memory changes 09/16/2021   Memory loss 09/16/2021   Migraine without aura and without status migrainosus, not intractable 07/28/2019   Migraines 1943 to present   "fairly frequently"; Takes Propranolol (01/12/2015)   Mixed hyperlipidemia 12/11/2018   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   Overactive bladder    Takes Vesicare   PAF (paroxysmal atrial fibrillation) (HCC) 05/04/2021   Primary osteoarthritis of left knee 12/03/2014   Proctitis 02/15/2022   S/p left hip fracture 07/29/2022   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   Seborrheic dermatitis 12/21/2020   Urinary retention with incomplete bladder emptying 06/30/2022     Allergies Allergies  Allergen Reactions   Tetanus Toxoids Swelling and Other (See Comments)    Swelling around throat and jaws   Bee Venom Swelling and Other (See Comments)    Crusty area on skin   Hornet Venom Swelling    Crusty area on skin   Dilaudid [Hydromorphone] Nausea And Vomiting    Causes nausea and vomiting   Erythromycin Nausea And Vomiting   Fiorinal [Butalbital-Aspirin-Caffeine]     Altered mental status   Amoxicillin Other (See Comments)    dizzy   Doxycycline Other (See Comments)    dizzy   Latex Other (See Comments)    Skin will crack open and bleed   Neomycin Other (See Comments)    Irritates skin   Penicillins Itching, Swelling and Rash     Medications  Current Outpatient Medications:    apixaban (ELIQUIS) 5 MG TABS tablet, Take 1 tablet (5 mg total) by mouth 2 (two) times daily., Disp: 180 tablet, Rfl: 1   atorvastatin (LIPITOR) 40 MG tablet, Take 1 tablet (40 mg total) by mouth daily., Disp: 90 tablet, Rfl: 3   B Complex-C-Calcium (B-COMPLEX/VITAMIN C, W/ CA, PO), Take 1 capsule by mouth daily., Disp: , Rfl:    budesonide-formoterol (SYMBICORT) 80-4.5 MCG/ACT inhaler, INHALE 2 PUFFS INTO LUNGS TWICE DAILY, IN THE MORNING AND EVENING, Disp: 30.6 g, Rfl: 6   dicyclomine  (BENTYL) 20 MG tablet, TAKE ONE TABLET BY MOUTH FOUR TIMES DAILY BEFORE MEALS AND AT BEDTIME Strength: 20 mg, Disp: 60 tablet, Rfl: 3   donepezil (ARICEPT) 5 MG tablet, Take 1 tablet (5 mg total) by mouth at bedtime., Disp: 90 tablet, Rfl: 0   estradiol (ESTRACE) 0.1 MG/GM vaginal cream, Place 1 Applicatorful vaginally at bedtime., Disp: , Rfl:    famotidine (PEPCID) 40 MG tablet, TAKE 1 TABLET(40 MG) BY MOUTH TWICE DAILY, Disp: 180 tablet, Rfl: 0   fluticasone (FLONASE) 50 MCG/ACT nasal spray, Can use one spray in each nostril once daily as directed., Disp: 48 g, Rfl: 1   hydrocortisone (ANUSOL-HC) 2.5 % rectal cream, Place 1 Application rectally 2 (two) times daily., Disp: 30 g, Rfl: 0  Hypromellose (ARTIFICIAL TEARS OP), Apply 1 drop to eye as needed for dry eyes., Disp: , Rfl:    loratadine (CLARITIN) 10 MG tablet, Take 10 mg by mouth daily., Disp: , Rfl:    Multiple Vitamins-Minerals (MULTIVITAMIN PO), Take 1 tablet by mouth daily., Disp: , Rfl:    PROLIA 60 MG/ML SOSY injection, Inject 60 mg into the skin every 6 (six) months., Disp: , Rfl:    tamsulosin (FLOMAX) 0.4 MG CAPS capsule, Take 0.4 mg by mouth daily., Disp: , Rfl:    VITAMIN E PO, Take 400 Units by mouth 2 (two) times daily., Disp: , Rfl:    Review of Systems Review of Systems  Constitutional:  Negative for chills, fever, malaise/fatigue and weight loss.  Respiratory:  Negative for cough and shortness of breath.   Cardiovascular:  Negative for chest pain, palpitations and leg swelling.  Gastrointestinal:  Positive for diarrhea. Negative for abdominal pain, constipation, heartburn, nausea and vomiting.  Musculoskeletal:  Negative for myalgias.  Skin:  Negative for itching and rash.  Neurological:  Positive for headaches. Negative for dizziness and weakness.       Objective:    Vitals BP (!) 148/94   Pulse 67   Temp 98.2 F (36.8 C)   Resp 17   Ht 4\' 11"  (1.499 m)   Wt 137 lb 6.4 oz (62.3 kg)   SpO2 97%   BMI 27.75  kg/m    Physical Examination Physical Exam Constitutional:      Appearance: Normal appearance. She is not ill-appearing.  Cardiovascular:     Rate and Rhythm: Normal rate and regular rhythm.     Pulses: Normal pulses.     Heart sounds: No murmur heard.    No friction rub. No gallop.  Pulmonary:     Effort: Pulmonary effort is normal. No respiratory distress.     Breath sounds: No wheezing, rhonchi or rales.  Abdominal:     General: Bowel sounds are normal. There is no distension.     Palpations: Abdomen is soft.     Tenderness: There is no abdominal tenderness.  Musculoskeletal:     Right lower leg: No edema.     Left lower leg: No edema.  Skin:    General: Skin is warm and dry.     Findings: No rash.  Neurological:     Mental Status: She is alert.     Comments: CN II-XII are fully intact.  Her strength is 5/5 throughout.        Assessment & Plan:   PAF (paroxysmal atrial fibrillation) (HCC) She is not on any rate control meds.  She seems to be in sinus rhythm today.  She remains on eliquis 2.5mg  BID.  Memory loss I reviewed her labs and MRI from 07/2022.  She does need a RPR tested.  I am going to do a PHQ 12 on her today and repeat a MMSE.  She has had problems with IBS and I dont think her aricept has worsened this.  We will set her up to see neurology and neuropsychology for neurocognitive assessment/testing.    Return in about 3 months (around 06/04/2023).   Crist Fat, MD

## 2023-03-09 ENCOUNTER — Encounter: Payer: Self-pay | Admitting: Cardiology

## 2023-03-09 ENCOUNTER — Ambulatory Visit: Payer: Medicare HMO | Attending: Cardiology | Admitting: Cardiology

## 2023-03-09 ENCOUNTER — Ambulatory Visit: Payer: Medicare HMO

## 2023-03-09 VITALS — BP 142/80 | HR 74 | Ht 60.0 in | Wt 136.4 lb

## 2023-03-09 DIAGNOSIS — R0989 Other specified symptoms and signs involving the circulatory and respiratory systems: Secondary | ICD-10-CM

## 2023-03-09 DIAGNOSIS — I10 Essential (primary) hypertension: Secondary | ICD-10-CM | POA: Diagnosis not present

## 2023-03-09 DIAGNOSIS — I480 Paroxysmal atrial fibrillation: Secondary | ICD-10-CM

## 2023-03-09 DIAGNOSIS — R413 Other amnesia: Secondary | ICD-10-CM | POA: Diagnosis not present

## 2023-03-09 DIAGNOSIS — Z79899 Other long term (current) drug therapy: Secondary | ICD-10-CM

## 2023-03-09 HISTORY — DX: Other specified symptoms and signs involving the circulatory and respiratory systems: R09.89

## 2023-03-09 NOTE — Progress Notes (Signed)
Cardiology Office Note:    Date:  03/09/2023   ID:  Shannon Martin, DOB 16-Apr-1941, MRN [id number]  PCP:  Crist Fat, MD  Cardiologist:  Garwin Brothers, MD   Referring MD: Blane Ohara, MD    ASSESSMENT:    1. PAF (paroxysmal atrial fibrillation) (HCC)   2. Essential hypertension   3. On amiodarone therapy   4. Bilateral carotid bruits    PLAN:    In order of problems listed above:  Primary prevention stressed with the patient.  Importance of compliance with diet medication stressed and patient verbalized standing. Paroxysmal atrial fibrillation:I discussed with the patient atrial fibrillation, disease process. Management and therapy including rate and rhythm control, anticoagulation benefits and potential risks were discussed extensively with the patient. Patient had multiple questions which were answered to patient's satisfaction. Carotid atherosclerosis: Patient has bilateral soft bruit and we will repeat carotid studies to evaluate this diagnosis. Mixed dyslipidemia: On lipid-lowering medications followed by primary care. Patient will be seen in follow-up appointment in 6 months or earlier if the patient has any concerns.    Medication Adjustments/Labs and Tests Ordered: Current medicines are reviewed at length with the patient today.  Concerns regarding medicines are outlined above.  Orders Placed This Encounter  Procedures   EKG 12-Lead   No orders of the defined types were placed in this encounter.    No chief complaint on file.    History of Present Illness:    Shannon Martin is a 82 y.o. female.  Patient has past medical history of paroxysmal atrial fibrillation on anticoagulation, mixed dyslipidemia.  She denies any problems at this time and takes care of activities of daily living.  No chest pain orthopnea or PND.  She is an active lady.  She has not had any palpitations or any such issues.  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/2019   GERD (gastroesophageal reflux disease)    Hip fracture (HCC) 06/30/2022   IBS (irritable bowel syndrome) 03/22/2022   Iliotibial band syndrome of left side 06/17/2015   Internal hemorrhoid, bleeding 05/24/2022   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   Laryngopharyngeal reflux (LPR) 03/24/2015   Left knee pain 10/01/2014   Memory changes 09/16/2021   Memory loss 09/16/2021   Migraine without aura and without status migrainosus, not intractable 07/28/2019   Migraines 1943 to present   "fairly frequently"; Takes Propranolol (01/12/2015)   Mixed hyperlipidemia 12/11/2018   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   Overactive bladder    Takes Vesicare   PAF (paroxysmal atrial fibrillation) (HCC) 05/04/2021   Primary osteoarthritis of left knee 12/03/2014   Proctitis 02/15/2022   S/p left hip fracture 07/29/2022   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   Seborrheic dermatitis 12/21/2020   Urinary retention with incomplete bladder emptying 06/30/2022    Past Surgical History:  Procedure Laterality Date   BREAST BIOPSY Left ~ 1973   CATARACT EXTRACTION W/ INTRAOCULAR LENS  IMPLANT, BILATERAL Bilateral 2003   CHEST TUBE INSERTION  January 2015   X 2 at [facility]   DILATION AND CURETTAGE OF UTERUS  4-5   JOINT REPLACEMENT  REPLACEMENT TOTAL KNEE Left    TONSILLECTOMY AND ADENOIDECTOMY  1946; 1947   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   Procedure: TOTAL HIP ARTHROPLASTY;  Surgeon: Joen Laura, MD;  Location: [facility] OR;  Service: Orthopedics;  Laterality: Left;   TOTAL KNEE ARTHROPLASTY Left 01/11/2015   TOTAL KNEE  ARTHROPLASTY Left 01/11/2015   Procedure: Left TOTAL KNEE ARTHROPLASTY;  Surgeon: Dannielle Huh, MD;  Location: [facility] OR;  Service: Orthopedics;  Laterality: Left;   VIDEO ASSISTED THORACOSCOPY (VATS)/THOROCOTOMY Left 07/13/2013   VATS with insertion of chest tubes    Current Medications: Current Meds  Medication Sig   apixaban (ELIQUIS) 5 MG TABS tablet Take 1 tablet (5 mg total) by mouth 2 (two) times daily.   atorvastatin (LIPITOR) 40 MG tablet Take 1 tablet (40 mg total) by mouth daily.   B Complex-C-Calcium (B-COMPLEX/VITAMIN C, W/ CA, PO) Take 1 capsule by mouth daily.   budesonide-formoterol (SYMBICORT) 80-4.5 MCG/ACT inhaler INHALE 2 PUFFS INTO LUNGS TWICE DAILY, IN THE MORNING AND EVENING   dicyclomine (BENTYL) 20 MG tablet TAKE ONE TABLET BY MOUTH FOUR TIMES DAILY BEFORE MEALS AND AT BEDTIME Strength: 20 mg   donepezil (ARICEPT) 5 MG tablet Take 1 tablet (5 mg total) by mouth at bedtime.   estradiol (ESTRACE) 0.1 MG/GM vaginal cream Place 1 Applicatorful vaginally at bedtime.   famotidine (PEPCID) 40 MG tablet TAKE 1 TABLET(40 MG) BY MOUTH TWICE DAILY   fluticasone (FLONASE) 50 MCG/ACT nasal spray Can use one spray in each nostril once daily as directed.   hydrocortisone (ANUSOL-HC) 2.5 % rectal cream Place 1 Application rectally 2 (two) times daily.   Hypromellose (ARTIFICIAL TEARS OP) Apply 1 drop to eye as needed for dry eyes.   loratadine (CLARITIN) 10 MG tablet Take 10 mg by mouth daily.   Multiple Vitamins-Minerals (MULTIVITAMIN PO) Take 1 tablet by mouth daily.   PROLIA 60 MG/ML SOSY injection Inject 60 mg into the skin every 6 (six) months.   tamsulosin (FLOMAX) 0.4 MG CAPS capsule Take 0.4 mg by mouth daily.   VITAMIN E PO Take 400 Units by mouth 2 (two) times daily.     Allergies:   Tetanus toxoids, Bee venom, Hornet venom, Dilaudid [hydromorphone], Erythromycin, Fiorinal [butalbital-aspirin-caffeine], Amoxicillin, Doxycycline, Latex, Neomycin, and Penicillins   Social  History   Socioeconomic History   Marital status: Widowed    Spouse name: Not on file   Number of children: 1   Years of education: Not on file   Highest education level: Not on file  Occupational History   Occupation: retired  Tobacco Use   Smoking status: Never   Smokeless tobacco: Never  Vaping Use   Vaping status: Never Used  Substance and Sexual Activity   Alcohol use: Yes    Alcohol/week: 2.0 standard drinks of alcohol    Types: 2 Glasses of wine per week    Comment: occasionally   Drug use: No   Sexual activity: Not Currently  Other Topics Concern   Not on file  Social History Narrative   Daughter lives nearby, granddaughter lives in [place] - married 3 times, all deceased.  Has not been in contact with her sister in >20 years   Social Determinants of Health   Financial Resource Strain: Low Risk  (03/14/2022)   Overall Financial Resource Strain (CARDIA)    Difficulty of Paying Living Expenses: Not hard at all  Food Insecurity: No Food Insecurity (10/16/2022)   Hunger Vital Sign    Worried About Running Out  of Food in the Last Year: Never true    Ran Out of Food in the Last Year: Never true  Transportation Needs: No Transportation Needs (10/16/2022)   PRAPARE - Administrator, Civil Service (Medical): No    Lack of Transportation (Non-Medical): No  Physical Activity: Insufficiently Active (10/16/2022)   Exercise Vital Sign    Days of Exercise per Week: 2 days    Minutes of Exercise per Session: 30 min  Stress: No Stress Concern Present (10/16/2022)   [employer] of Occupational Health - Occupational Stress Questionnaire    Feeling of Stress : Only a little  Social Connections: Unknown (10/11/2021)   Social Connection and Isolation Panel [NHANES]    Frequency of Communication with Friends and Family: More than three times a week    Frequency of Social Gatherings with Friends and Family: More than three times a week    Attends Religious Services:  Not on file    Active Member of Clubs or Organizations: No    Attends Banker Meetings: Never    Marital Status: Widowed     Family History: The patient's family history includes Diabetes in her father; Hypertension in her maternal grandfather, maternal grandmother, and mother.  ROS:   Please see the history of present illness.    All other systems reviewed and are negative.  EKGs/Labs/Other Studies Reviewed:    The following studies were reviewed today: I discussed my findings with the patient at length. ..EKG Interpretation Date/Time:  Friday March 09 2023 16:31:31 EDT Ventricular Rate:  74 PR Interval:  176 QRS Duration:  84 QT Interval:  378 QTC Calculation: 419 R Axis:   22  Text Interpretation: Normal sinus rhythm Normal ECG When compared with ECG of 30-Jun-2022 09:41, PREVIOUS ECG IS PRESENT Confirmed by Belva Crome [phone]) on 03/09/2023 4:43:49 PM     Recent Labs: 07/03/2022: Magnesium 1.7 07/25/2022: TSH 2.560 10/02/2022: ALT 17; BUN 22; Creatinine, Ser 1.03; Hemoglobin 11.4; Platelets 376; Potassium 3.9; Sodium 138  Recent Lipid Panel    Component Value Date/Time   CHOL 174 10/02/2022 0908   TRIG 54 10/02/2022 0908   HDL 86 10/02/2022 0908   CHOLHDL 2.0 10/02/2022 0908   LDLCALC 77 10/02/2022 0908    Physical Exam:    VS:  BP (!) 142/80 (BP Location: Right Arm, Patient Position: Sitting, Cuff Size: Normal)   Pulse 74   Ht 5' (1.524 m)   Wt 136 lb 6.4 oz (61.9 kg)   SpO2 99%   BMI 26.64 kg/m     Wt Readings from Last 3 Encounters:  03/09/23 136 lb 6.4 oz (61.9 kg)  03/05/23 137 lb 6.4 oz (62.3 kg)  10/16/22 142 lb (64.4 kg)     GEN: Patient is in no acute distress HEENT: Normal NECK: No JVD; soft bilateral carotid bruits LYMPHATICS: No lymphadenopathy CARDIAC: Hear sounds regular, 2/6 systolic murmur at the apex. RESPIRATORY:  Clear to auscultation without rales, wheezing or rhonchi  ABDOMEN: Soft, non-tender,  non-distended MUSCULOSKELETAL:  No edema; No deformity  SKIN: Warm and dry NEUROLOGIC:  Alert and oriented x 3 PSYCHIATRIC:  Normal affect   Signed, Garwin Brothers, MD  03/09/2023 4:55 PM    [facility]

## 2023-03-09 NOTE — Patient Instructions (Signed)
Medication Instructions:  Your physician recommends that you continue on your current medications as directed. Please refer to the Current Medication list given to you today.  *If you need a refill on your cardiac medications before your next appointment, please call your pharmacy*   Lab Work: NONE If you have labs (blood work) drawn today and your tests are completely normal, you will receive your results only by: MyChart Message (if you have MyChart) OR A paper copy in the mail If you have any lab test that is abnormal or we need to change your treatment, we will call you to review the results.   Testing/Procedures: Your physician has requested that you have a carotid duplex. This test is an ultrasound of the carotid arteries in your neck. It looks at blood flow through these arteries that supply the brain with blood. Allow one hour for this exam. There are no restrictions or special instructions.    Follow-Up: At [facility], you and your health needs are our priority.  As part of our continuing mission to provide you with exceptional heart care, we have created designated Provider Care Teams.  These Care Teams include your primary Cardiologist (physician) and Advanced Practice Providers (APPs -  Physician Assistants and Nurse Practitioners) who all work together to provide you with the care you need, when you need it.  We recommend signing up for the patient portal called "MyChart".  Sign up information is provided on this After Visit Summary.  MyChart is used to connect with patients for Virtual Visits (Telemedicine).  Patients are able to view lab/test results, encounter notes, upcoming appointments, etc.  Non-urgent messages can be sent to your provider as well.   To learn more about what you can do with MyChart, go to [email or web address].    Your next appointment:   9 month(s)  Provider:   Belva Crome, MD    Other Instructions

## 2023-03-10 LAB — RPR: RPR Ser Ql: NONREACTIVE

## 2023-03-11 ENCOUNTER — Other Ambulatory Visit: Payer: Self-pay | Admitting: Family Medicine

## 2023-03-14 DIAGNOSIS — Z0100 Encounter for examination of eyes and vision without abnormal findings: Secondary | ICD-10-CM | POA: Diagnosis not present

## 2023-03-15 ENCOUNTER — Encounter: Payer: Self-pay | Admitting: Physician Assistant

## 2023-03-30 NOTE — Progress Notes (Signed)
Tell her that her memory lab to rule out other causes was negative.   Patient aware of Labs

## 2023-04-03 ENCOUNTER — Ambulatory Visit: Payer: Medicare HMO | Admitting: Family Medicine

## 2023-04-06 ENCOUNTER — Ambulatory Visit: Payer: Medicare HMO | Admitting: Physician Assistant

## 2023-04-16 ENCOUNTER — Ambulatory Visit: Payer: Medicare HMO | Attending: Cardiology

## 2023-04-16 DIAGNOSIS — R0989 Other specified symptoms and signs involving the circulatory and respiratory systems: Secondary | ICD-10-CM | POA: Diagnosis not present

## 2023-05-04 NOTE — Progress Notes (Addendum)
Assessment/Plan:     Shannon Martin is a very pleasant 82 y.o. year old RH female with a history of hypertension, hyperlipidemia, PAF, seen today for evaluation of memory loss. MoCA today is 22/30 .  MRI of the brain performed at [facility] negative for acute findings, mild volume loss,  mild chronic ischemic changes.  Etiology of memory loss is unclear at this time, however there is concern for Alzheimer's disease although early LBD could be consider given her behavioral changes, as well as formal visual hallucinations, RBD disorder although, no parkinsonian signs are noted.  Further investigation is recommended, including neuropsych evaluation for clarity of diagnosis.  Patient is already on donepezil 5 mg daily, tolerating well.   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 Side effects discussed  Check B12, TSH Recommend good control of cardiovascular risk factors.   Continue to control mood as per PCP [place] up in 3 months   Subjective:    The patient is accompanied by her daughter who supplements the history.    How long did patient have memory difficulties?  For about 3 years.  "Little things, especially when the material not important". Patient reports more difficulty remembering new information, recent conversations, names. There has been confabulation for at least 3-4 years according to daughter. LTM is great per daughter's report. She reads voraciously, and likes to play solitaire.  "She spends a lot of time in her room ".  repeats oneself?  Endorsed, especially with appointments  Disoriented when walking into a room?  Patient denies    Leaving objects in unusual places?  She may misplace the phone.      Wandering behavior? denies. Any personality changes, or depression, anxiety?  This is to be more pronounced, she was afraid to live alone, for which her daughter moved her with her.  She reports since then "I am doing better".   Hallucinations or paranoia? Denies although her daughter says she said "she was being chased by giant spiders (May 2024) ". There have been other incidents. She is afraid to be left alone, especially at night, she checks several times before going to sleep.  One time, she went to her daughters bedroom,, back to her daughter's close, to get back to her room, [place].  Once they came, "she did not know if those 3 people outside were alive or dead ".  They questioned the daughter because she stated that they were keeping her hostage, "a whole mess"".   Seizures? denies .   Any sleep changes?  Overall she sleeps well however occasionally she may have night terrors and when she wakes up she may feel disoriented.  Patient reports that she has been dong that since being a child.  Daughter reports that she has vivid dreams, + REM behavior which shadowboxing,  denies sleepwalking.  Sleep apnea? Denies.   Any hygiene concerns? Endorsed "since at least January, we are lucky if she does every 2 weeks" Independent of bathing and dressing? Endorsed  Does the patient need help with medications? Patient is in charge, daughter monitors.  She has a calendar to check off to make sure she does it     Who is in charge of the finances? Daughter  is in charge  Any changes in appetite?   Denies. Daughter thinks she does not eat when no one is in the house   Patient have trouble swallowing?  Denies.   Does the patient cook? No. She moved to her daughter house and she took away the handle leaving it at the burner, the fire department came noticing it with as well as an empty teapot on the stove.   Any headaches? She has a history of migraines not recently    Chronic pain?  Denies.   Ambulates with difficulty? Denies  Recent falls or head injuries? 3 years ago and hit the L forehead, no LOC.  One fall on the parking lot while walking the door 6 y ago   Vision changes?  Denies Any strokelike symptoms? Denies.   Any tremors? Denies  Any anosmia? Endorsed over the last 3 years  Any incontinence of urine? She has a history of OAB " sees urology.   Any bowel dysfunction?" Off and on".     Patient lives with her daughter since Jan 2024     History of heavy alcohol intake? Denies.   History of heavy tobacco use? Denies.   Family history of dementia?  Father had AD. PGM dementia ?type.  Does patient drive?   No longer drives. Daughter hid the car keys because she was getting lost and not driving well.  Allergies  Allergen Reactions   Tetanus Toxoids Swelling and Other (See Comments)    Swelling around throat and jaws   Bee Venom Swelling and Other (See Comments)    Crusty area on skin   Hornet Venom Swelling    Crusty area on skin   Dilaudid [Hydromorphone] Nausea And Vomiting    Causes nausea and vomiting   Erythromycin Nausea And Vomiting   Fiorinal [Butalbital-Aspirin-Caffeine]     Altered mental status   Amoxicillin Other (See Comments)    dizzy   Doxycycline Other (See Comments)    dizzy   Latex Other (See Comments)    Skin will crack open and bleed   Neomycin Other (See Comments)    Irritates skin   Penicillins Itching, Swelling and Rash    Current Outpatient Medications  Medication Instructions   apixaban (ELIQUIS) 5 mg, Oral, 2 times daily   atorvastatin (LIPITOR) 40 mg, Oral, Daily   B Complex-C-Calcium (B-COMPLEX/VITAMIN C, W/ CA, PO) 1 capsule, Oral, Daily   budesonide-formoterol (SYMBICORT) 80-4.5 MCG/ACT inhaler INHALE 2 PUFFS INTO LUNGS TWICE DAILY, IN THE MORNING AND EVENING   dicyclomine (BENTYL) 20 MG tablet TAKE ONE TABLET BY MOUTH FOUR TIMES DAILY BEFORE MEALS AND AT BEDTIME Strength: 20 mg   donepezil (ARICEPT) 5 mg, Oral, Daily  at bedtime   estradiol (ESTRACE) 0.1 MG/GM vaginal cream 1 Applicatorful, Vaginal, Daily at bedtime   famotidine (PEPCID) 40 MG tablet TAKE 1 TABLET(40 MG) BY MOUTH TWICE DAILY   fluticasone (FLONASE) 50 MCG/ACT nasal spray Can use one spray in each nostril once daily as directed.   hydrocortisone (ANUSOL-HC) 2.5 % rectal cream 1 Application, Rectal, 2 times daily   Hypromellose (ARTIFICIAL TEARS OP) 1 drop, Ophthalmic, As needed   loratadine (CLARITIN) 10 mg, Oral, Daily   Multiple Vitamins-Minerals (MULTIVITAMIN PO) 1 tablet, Oral, Daily   Prolia 60 mg, Subcutaneous, Every 6 months   tamsulosin (FLOMAX) 0.4 mg, Oral, Daily   VITAMIN E PO 400 Units, Oral, 2 times daily     VITALS:   Vitals:   05/07/23 0754  BP: (!) 158/88  Pulse: 79  SpO2: 98%  Weight: 143 lb (64.9 kg)  Height: 4' 11.5" (1.511 m)      PHYSICAL EXAM   HEENT:  Normocephalic, atraumatic.  The superficial temporal arteries are without ropiness or tenderness. Cardiovascular: Regular rate and rhythm. 2/6 SM  Lungs: Clear to auscultation bilaterally. Neck: There are R carotid bruits   NEUROLOGICAL:     No data to display             03/05/2023    5:08 PM 07/25/2022   11:58 AM 09/16/2021    9:29 AM  MMSE - Mini Mental State Exam  Orientation to time 5 5 4   Orientation to Place 5 5 5   Registration 2 3 3   Attention/ Calculation 5 5 5   Recall 3 3 3   Language- name 2 objects 2 2 2   Language- repeat 1 1 1   Language- follow 3 step command 3 3 3   Language- read & follow direction 1 1 1   Write a sentence 1 1 1   Copy design 1 1 1   Total score 29 30 29      Orientation:  Alert and oriented to person, place and  time. No aphasia or dysarthria. Fund of knowledge is appropriate. Recent and remote memory impaired.  Attention and concentration are normal.  Able to name objects and repeat phrases   Delayed recall  1/5 Cranial nerves: There is good facial symmetry. Extraocular muscles are intact and visual fields are  full to confrontational testing. Speech is fluent and clear. No tongue deviation. Hearing is intact to conversational tone.  Tone: Tone is good throughout. Sensation: Sensation is intact to light touch.  Vibration is intact at the bilateral big toe.  Coordination: The patient has no difficulty with RAM's or FNF bilaterally. Normal finger to nose  Motor: Strength is 5/5 in the bilateral upper and lower extremities. There is no pronator drift. There are no fasciculations noted. DTR's: Deep tendon reflexes are 2/4 bilaterally. Gait and Station: The patient is able to ambulate without difficulty. Gait is cautious and narrow. Stride length is normal        Thank you for allowing [place] the opportunity to participate in the care of this nice patient. Please do not hesitate to contact us for any questions or concerns.   Total time spent on today's visit was 62 minutes dedicated to this patient today, preparing to see patient, examining the patient, ordering tests and/or medications and counseling the patient, documenting clinical information in the EHR or other health record, independently interpreting results and communicating results to the patient/family, discussing treatment and goals, answering patient's questions and coordinating care.  Cc:  Crist Fat, MD  Marlowe Kays 05/07/2023 8:52 AM

## 2023-05-07 ENCOUNTER — Ambulatory Visit: Payer: Medicare HMO | Admitting: Physician Assistant

## 2023-05-07 ENCOUNTER — Encounter: Payer: Self-pay | Admitting: Physician Assistant

## 2023-05-07 VITALS — BP 158/88 | HR 79 | Ht 59.5 in | Wt 143.0 lb

## 2023-05-07 DIAGNOSIS — R413 Other amnesia: Secondary | ICD-10-CM | POA: Diagnosis not present

## 2023-05-07 NOTE — Patient Instructions (Signed)
It was a pleasure to see you today at our office.   Recommendations:  Neurocognitive evaluation at our office   Follow up in  3 months Continue donepezil, consider increasing  10 mg daily. Side effects were discussed  Consider discussing referral to sleep studies  Recommend visiting the website : " [facility]ementia [facility]" to better understand some behaviors related to memory loss.    For psychiatric meds, mood meds: Please have your primary care physician manage these medications.  If you have any severe symptoms of a stroke, or other severe issues such as confusion,severe chills or fever, etc call 911 or go to the ER as you may need to be evaluated further   For guidance regarding [facility] and if placement were needed at the facility, contact Social Worker tel: [phone]  For assessment of decision of mental capacity and competency:  Call Dr. Erick Blinks, geriatric psychiatrist at [phone]  Counseling regarding caregiver distress, including caregiver depression, anxiety and issues regarding community resources, adult day care programs, adult living facilities, or memory care questions:  please contact your  Primary Doctor's Social Worker   Whom to call: Memory  decline, memory medications: Call our office [phone]    [email or web address]   RECOMMENDATIONS FOR ALL PATIENTS WITH MEMORY PROBLEMS: 1. Continue to exercise (Recommend 30 minutes of walking everyday, or 3 hours every week) 2. Increase social interactions - continue going to [place] and enjoy social gatherings with friends and family 3. Eat healthy, avoid fried foods and eat more fruits and vegetables 4. Maintain adequate blood pressure, blood sugar, and blood cholesterol level. Reducing the risk of stroke and cardiovascular disease also helps promoting better memory. 5. Avoid stressful situations. Live a simple life and avoid aggravations.  Organize your time and prepare for the next day in anticipation. 6. Sleep well, avoid any interruptions of sleep and avoid any distractions in the bedroom that may interfere with adequate sleep quality 7. Avoid sugar, avoid sweets as there is a strong link between excessive sugar intake, diabetes, and cognitive impairment We discussed the Mediterranean diet, which has been shown to help patients reduce the risk of progressive memory disorders and reduces cardiovascular risk. This includes eating fish, eat fruits and green leafy vegetables, nuts like almonds and hazelnuts, walnuts, and also use olive oil. Avoid fast foods and fried foods as much as possible. Avoid sweets and sugar as sugar use has been linked to worsening of memory function.  There is always a concern of gradual progression of memory problems. If this is the case, then we may need to adjust level of care according to patient needs. Support, both to the patient and caregiver, should then be put into place.      You have been referred for a neuropsychological evaluation (i.e., evaluation of memory and thinking abilities). Please bring someone with you to this appointment if possible, as it is helpful for the doctor to hear from both you and another adult who knows you well. Please bring eyeglasses and hearing aids if you wear them.    The evaluation will take approximately 3 hours and has two parts:   The first part is a clinical interview with the neuropsychologist (Dr. Milbert Coulter or Dr. Roseanne Reno). During the interview, the neuropsychologist will speak with you and the individual you brought to the appointment.    The second part of the evaluation is testing with the doctor's technician Annabelle Harman or Selena Batten). During the testing, the technician will ask you to  remember different types of material, solve problems, and answer some questionnaires. Your family member will not be present for this portion of the evaluation.   Please note: We must reserve  several hours of the neuropsychologist's time and the psychometrician's time for your evaluation appointment. As such, there is a No-Show fee of $100. If you are unable to attend any of your appointments, please contact our office as soon as possible to reschedule.       FALL PRECAUTIONS: Be cautious when walking. Scan the area for obstacles that may increase the risk of trips and falls. When getting up in the mornings, sit up at the edge of the bed for a few minutes before getting out of bed. Consider elevating the bed at the head end to avoid drop of blood pressure when getting up. Walk always in a well-lit room (use night lights in the walls). Avoid area rugs or power cords from appliances in the middle of the walkways. Use a walker or a cane if necessary and consider physical therapy for balance exercise. Get your eyesight checked regularly.  FINANCIAL OVERSIGHT: Supervision, especially oversight when making financial decisions or transactions is also recommended.  HOME SAFETY: Consider the safety of the kitchen when operating appliances like stoves, microwave oven, and blender. Consider having supervision and share cooking responsibilities until no longer able to participate in those. Accidents with firearms and other hazards in the house should be identified and addressed as well.   ABILITY TO BE LEFT ALONE: If patient is unable to contact 911 operator, consider using LifeLine, or when the need is there, arrange for someone to stay with patients. Smoking is a fire hazard, consider supervision or cessation. Risk of wandering should be assessed by caregiver and if detected at any point, supervision and safe proof recommendations should be instituted.  MEDICATION SUPERVISION: Inability to self-administer medication needs to be constantly addressed. Implement a mechanism to ensure safe administration of the medications.      Mediterranean Diet A Mediterranean diet refers to food and lifestyle  choices that are based on the traditions of countries located on the [place]. This way of eating has been shown to help prevent certain conditions and improve outcomes for people who have chronic diseases, like kidney disease and heart disease. What are tips for following this plan? Lifestyle  Cook and eat meals together with your family, when possible. Drink enough fluid to keep your urine clear or pale yellow. Be physically active every day. This includes: Aerobic exercise like running or swimming. Leisure activities like gardening, walking, or housework. Get 7-8 hours of sleep each night. If recommended by your health care provider, drink red wine in moderation. This means 1 glass a day for nonpregnant women and 2 glasses a day for men. A glass of wine equals 5 oz (150 mL). Reading food labels  Check the serving size of packaged foods. For foods such as rice and pasta, the serving size refers to the amount of cooked product, not dry. Check the total fat in packaged foods. Avoid foods that have saturated fat or trans fats. Check the ingredients list for added sugars, such as corn syrup. Shopping  At the grocery store, buy most of your food from the areas near the walls of the store. This includes: Fresh fruits and vegetables (produce). Grains, beans, nuts, and seeds. Some of these may be available in unpackaged forms or large amounts (in bulk). Fresh seafood. Poultry and eggs. Low-fat dairy products. Buy whole ingredients  instead of prepackaged foods. Buy fresh fruits and vegetables in-season from local farmers markets. Buy frozen fruits and vegetables in resealable bags. If you do not have access to quality fresh seafood, buy precooked frozen shrimp or canned fish, such as tuna, salmon, or sardines. Buy small amounts of raw or cooked vegetables, salads, or olives from the deli or salad bar at your store. Stock your pantry so you always have certain foods on hand, such as olive  oil, canned tuna, canned tomatoes, rice, pasta, and beans. Cooking  Cook foods with extra-virgin olive oil instead of using butter or other vegetable oils. Have meat as a side dish, and have vegetables or grains as your main dish. This means having meat in small portions or adding small amounts of meat to foods like pasta or stew. Use beans or vegetables instead of meat in common dishes like chili or lasagna. Experiment with different cooking methods. Try roasting or broiling vegetables instead of steaming or sauteing them. Add frozen vegetables to soups, stews, pasta, or rice. Add nuts or seeds for added healthy fat at each meal. You can add these to yogurt, salads, or vegetable dishes. Marinate fish or vegetables using olive oil, lemon juice, garlic, and fresh herbs. Meal planning  Plan to eat 1 vegetarian meal one day each week. Try to work up to 2 vegetarian meals, if possible. Eat seafood 2 or more times a week. Have healthy snacks readily available, such as: Vegetable sticks with hummus. Greek yogurt. Fruit and nut trail mix. Eat balanced meals throughout the week. This includes: Fruit: 2-3 servings a day Vegetables: 4-5 servings a day Low-fat dairy: 2 servings a day Fish, poultry, or lean meat: 1 serving a day Beans and legumes: 2 or more servings a week Nuts and seeds: 1-2 servings a day Whole grains: 6-8 servings a day Extra-virgin olive oil: 3-4 servings a day Limit red meat and sweets to only a few servings a month What are my food choices? Mediterranean diet Recommended Grains: Whole-grain pasta. Brown rice. Bulgar wheat. Polenta. Couscous. Whole-wheat bread. Orpah Cobb. Vegetables: Artichokes. Beets. Broccoli. Cabbage. Carrots. Eggplant. Green beans. Chard. Kale. Spinach. Onions. Leeks. Peas. Squash. Tomatoes. Peppers. Radishes. Fruits: Apples. Apricots. Avocado. Berries. Bananas. Cherries. Dates. Figs. Grapes. Lemons. Melon. Oranges. Peaches. Plums.  Pomegranate. Meats and other protein foods: Beans. Almonds. Sunflower seeds. Pine nuts. Peanuts. Cod. Salmon. Scallops. Shrimp. Tuna. Tilapia. Clams. Oysters. Eggs. Dairy: Low-fat milk. Cheese. Greek yogurt. Beverages: Water. Red wine. Herbal tea. Fats and oils: Extra virgin olive oil. Avocado oil. Grape seed oil. Sweets and desserts: [place] yogurt with honey. Baked apples. Poached pears. Trail mix. Seasoning and other foods: Basil. Cilantro. Coriander. Cumin. Mint. Parsley. Sage. Rosemary. Tarragon. Garlic. Oregano. Thyme. Pepper. Balsalmic vinegar. Tahini. Hummus. Tomato sauce. Olives. Mushrooms. Limit these Grains: Prepackaged pasta or rice dishes. Prepackaged cereal with added sugar. Vegetables: Deep fried potatoes (french fries). Fruits: Fruit canned in syrup. Meats and other protein foods: Beef. Pork. Lamb. Poultry with skin. Hot dogs. Tomasa Blase. Dairy: Ice cream. Sour cream. Whole milk. Beverages: Juice. Sugar-sweetened soft drinks. Beer. Liquor and spirits. Fats and oils: Butter. Canola oil. Vegetable oil. Beef fat (tallow). Lard. Sweets and desserts: Cookies. Cakes. Pies. Candy. Seasoning and other foods: Mayonnaise. Premade sauces and marinades. The items listed may not be a complete list. Talk with your dietitian about what dietary choices are right for you. Summary The Mediterranean diet includes both food and lifestyle choices. Eat a variety of fresh fruits and vegetables, beans, nuts, seeds, and  whole grains. Limit the amount of red meat and sweets that you eat. Talk with your health care provider about whether it is safe for you to drink red wine in moderation. This means 1 glass a day for nonpregnant women and 2 glasses a day for men. A glass of wine equals 5 oz (150 mL). This information is not intended to replace advice given to you by your health care provider. Make sure you discuss any questions you have with your health care provider. Document Released: 02/03/2016 Document  Revised: 03/07/2016 Document Reviewed: 02/03/2016 Elsevier Interactive Patient Education  2017 [employer].

## 2023-05-25 ENCOUNTER — Other Ambulatory Visit: Payer: Self-pay | Admitting: Family Medicine

## 2023-05-28 ENCOUNTER — Other Ambulatory Visit: Payer: Self-pay

## 2023-05-28 DIAGNOSIS — I480 Paroxysmal atrial fibrillation: Secondary | ICD-10-CM

## 2023-05-28 MED ORDER — APIXABAN 5 MG PO TABS: 5.0000 mg | ORAL_TABLET | Freq: Two times a day (BID) | ORAL | 1 refills | Status: DC

## 2023-05-28 NOTE — Telephone Encounter (Signed)
Prescription refill request for Eliquis received. Indication: Afib  Last office visit: 03/09/23 (Revankar)  Scr: 1.03 (10/02/22)  Age: 82 Weight: 64.9kg  Appropriate dose. Refill sent.

## 2023-06-08 ENCOUNTER — Ambulatory Visit: Payer: Medicare HMO | Admitting: Internal Medicine

## 2023-06-08 ENCOUNTER — Encounter: Payer: Self-pay | Admitting: Internal Medicine

## 2023-06-08 VITALS — BP 160/82 | HR 66 | Temp 98.4°F | Resp 18 | Ht 60.0 in | Wt 137.8 lb

## 2023-06-08 DIAGNOSIS — N1831 Chronic kidney disease, stage 3a: Secondary | ICD-10-CM

## 2023-06-08 DIAGNOSIS — M810 Age-related osteoporosis without current pathological fracture: Secondary | ICD-10-CM

## 2023-06-08 DIAGNOSIS — R413 Other amnesia: Secondary | ICD-10-CM

## 2023-06-08 DIAGNOSIS — N390 Urinary tract infection, site not specified: Secondary | ICD-10-CM | POA: Diagnosis not present

## 2023-06-08 HISTORY — DX: Chronic kidney disease, stage 3a: N18.31

## 2023-06-08 LAB — POCT URINALYSIS DIPSTICK
Bilirubin, UA: NEGATIVE
Blood, UA: NEGATIVE
Glucose, UA: NEGATIVE
Ketones, UA: NEGATIVE
Nitrite, UA: POSITIVE
Protein, UA: POSITIVE — AB
Spec Grav, UA: 1.025 (ref 1.010–1.025)
Urobilinogen, UA: 0.2 U/dL
pH, UA: 6 (ref 5.0–8.0)

## 2023-06-08 MED ORDER — CIPROFLOXACIN HCL 500 MG PO TABS: 500.0000 mg | ORAL_TABLET | Freq: Every day | ORAL | 0 refills | Status: AC

## 2023-06-08 MED ORDER — CIPROFLOXACIN HCL 500 MG PO TABS: 500.0000 mg | ORAL_TABLET | Freq: Two times a day (BID) | ORAL | 0 refills | Status: DC

## 2023-06-08 MED ORDER — DONEPEZIL HCL 10 MG PO TABS: 10.0000 mg | ORAL_TABLET | Freq: Every day | ORAL | 5 refills | Status: DC

## 2023-06-08 MED ORDER — SHINGRIX 50 MCG/0.5ML IM SUSR: 0.5000 mL | Freq: Once | INTRAMUSCULAR | 0 refills | Status: AC

## 2023-06-08 NOTE — Assessment & Plan Note (Signed)
I had a discussion regarding NSAIDS.  I reviewed her chronological labs.  We will continue to monitor.

## 2023-06-08 NOTE — Assessment & Plan Note (Signed)
She is going for neuropsychlogical testing.  I am going to increase her aricept to 10mg  daily.

## 2023-06-08 NOTE — Assessment & Plan Note (Signed)
We will obtain labs for her prolia injection.

## 2023-06-08 NOTE — Progress Notes (Unsigned)
Office Visit  Subjective   Patient ID: Shannon Martin   DOB: 1941-02-07   Age: 82 y.o.   MRN: [id number]   Chief Complaint Chief Complaint  Patient presents with   Follow-up     History of Present Illness The patient is a 82 yo female who returns today for followup of her memory.  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  They agreed that she needed neurocognitive testing to further evaluate cognitive concerns and determine other underlying cause of memory changes, including potential contribution from sleep, anxiety, attention, or depression among others.  The neurologist recommended going up on her dose of aricept and control her mood and followup with them in several months.  The patient now has an appointment with neuropsychology on 06/15/2023.  Her daughter first noted that the patient began having some mild memory problems about 2 years ago.  They noticed that she had problems remembering how to drive to certain places.  She did have an accidental fall in 06/2022 where she had a left femoral hip fracture where she was hospitalized and had a THA done at that time.  The patient was sent to [facility] for short term rehab and they noted that after this event her memory was worsened.  The patient was independently living by herself before then but after [facility] she went to live with her daughter as she could not be left alone.  She became confused during a vacation in 09/2022 where she woke up and did not know where she was at and called 911.  There has been some sundowning where if she falls asleep and wakes up later at night it takes time to get her bearings or she wakes up and says something nonsensical.  She has problems with short term memories but not long term memories.  She still recognizes everyone but cannot put the right name with the right person.  The daughter states that Shannon Martin keeps  to herself and reads on her own.  There has been no behavioral problems and there may be some anxiety but they are not sure about depression.  There is no crying episodes.  The patient states she gets up 3-4 times a night to get up to use the bathroom.  She was assessed by her previous primary care physician in 07/2022 where they did blood work and a MMSE.  Her labs were unremarkable and her daughter states she did well with her MMSE.  They did a MRI of her brain on 08/07/2022 showed age congruent brain MRI. No specific or reversible cause for symptoms.  The patient was started on aricept 5mg  at bedtime and she states she has some diarrhea that comes and goes.  She otherwise does her other ADL's.    The patient is a 82 year old female, post-menopausal, who presents for followup of her osteoporosis. She has no specific complaints related to bone loss.  She had a fall in 06/2022 and ended up with a left hip fracture.  She denies a family history of osteoporosis. The following risk factors are noted: nonesmoking She denies the following: diabetes mellitus, high caffeine intake, alcohol consumption of more that 7 ounces per week, daily prednisone use, hyperthyroidism, surgical resection of her bowel, and surgical resection of her stomach. She states she exercises routinely. A bone mineral density study was performed on 10/19/2020 where she had a femur t-score  of -2.1.  She sees [facility] where she was started on prolia in 12/06/2022.  Her last bone density was done on 10/27/2022 and this showed a t-score of -3.6 from her radius.  She is due for another prolia injection this month. She remains on calcium and Vit D supplementation. She is not doing any weight bearing exercises.  The patient also has Stage IIIa CKD which on looking back on her labs, she has had since 2016.  Her baseline creatinine ranges 0.9-1.1 and her GFR runs 48-65.  The patient denies any NSAIDS.     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   GERD (gastroesophageal reflux disease)    Hip fracture (HCC) 06/30/2022   IBS (irritable bowel syndrome) 03/22/2022   Iliotibial band syndrome of left side 06/17/2015   Internal hemorrhoid, bleeding 05/24/2022   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   Laryngopharyngeal reflux (LPR) 03/24/2015   Left knee pain 10/01/2014   Memory changes 09/16/2021   Memory loss 09/16/2021   Migraine without aura and without status migrainosus, not intractable 07/28/2019   Migraines 1942 to present   "fairly frequently"; Takes Propranolol (01/12/2015)   Mixed hyperlipidemia 12/11/2018   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   Overactive bladder    Takes Vesicare   PAF (paroxysmal atrial fibrillation) (HCC) 05/04/2021   Primary osteoarthritis of left knee 12/03/2014   Proctitis 02/15/2022   S/p left hip fracture 07/29/2022   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   Seborrheic dermatitis 12/21/2020   Urinary retention with incomplete bladder emptying 06/30/2022     Allergies Allergies  Allergen Reactions   Tetanus Toxoids Swelling and Other (See Comments)    Swelling around throat and jaws   Bee Venom Swelling and Other (See Comments)    Crusty area on skin   Hornet Venom Swelling    Crusty area on skin   Dilaudid [Hydromorphone] Nausea And Vomiting    Causes nausea and vomiting   Erythromycin Nausea And Vomiting   Fiorinal [Butalbital-Aspirin-Caffeine]     Altered mental status   Amoxicillin Other (See Comments)    dizzy   Doxycycline Other (See Comments)    dizzy   Latex Other (See Comments)     Skin will crack open and bleed   Neomycin Other (See Comments)    Irritates skin   Penicillins Itching, Swelling and Rash     Medications  Current Outpatient Medications:    apixaban (ELIQUIS) 5 MG TABS tablet, Take 1 tablet (5 mg total) by mouth 2 (two) times daily., Disp: 180 tablet, Rfl: 1   atorvastatin (LIPITOR) 40 MG tablet, Take 1 tablet (40 mg total) by mouth daily., Disp: 90 tablet, Rfl: 3   B Complex-C-Calcium (B-COMPLEX/VITAMIN C, W/ CA, PO), Take 1 capsule by mouth daily., Disp: , Rfl:    budesonide-formoterol (SYMBICORT) 80-4.5 MCG/ACT inhaler, INHALE 2 PUFFS INTO LUNGS TWICE DAILY, IN THE MORNING AND EVENING, Disp: 30.6 g, Rfl: 6   estradiol (ESTRACE) 0.1 MG/GM vaginal cream, Place 1 Applicatorful vaginally at bedtime., Disp: , Rfl:    famotidine (PEPCID) 40 MG tablet, TAKE 1 TABLET(40 MG) BY MOUTH TWICE DAILY, Disp:  180 tablet, Rfl: 0   fluticasone (FLONASE) 50 MCG/ACT nasal spray, Can use one spray in each nostril once daily as directed., Disp: 48 g, Rfl: 1   hydrocortisone (ANUSOL-HC) 2.5 % rectal cream, Place 1 Application rectally 2 (two) times daily., Disp: 30 g, Rfl: 0   Hypromellose (ARTIFICIAL TEARS OP), Apply 1 drop to eye as needed for dry eyes., Disp: , Rfl:    loratadine (CLARITIN) 10 MG tablet, Take 10 mg by mouth daily., Disp: , Rfl:    Multiple Vitamins-Minerals (MULTIVITAMIN PO), Take 1 tablet by mouth daily., Disp: , Rfl:    PROLIA 60 MG/ML SOSY injection, Inject 60 mg into the skin every 6 (six) months., Disp: , Rfl:    tamsulosin (FLOMAX) 0.4 MG CAPS capsule, Take 0.4 mg by mouth daily., Disp: , Rfl:    VITAMIN E PO, Take 400 Units by mouth 2 (two) times daily., Disp: , Rfl:    Review of Systems Review of Systems  Constitutional:  Negative for chills and fever.  Eyes:  Negative for blurred vision and double vision.  Respiratory:  Negative for shortness of breath.   Cardiovascular:  Negative for chest pain, palpitations and leg swelling.   Gastrointestinal:  Positive for diarrhea. Negative for abdominal pain, constipation, nausea and vomiting.  Genitourinary:  Negative for frequency.  Musculoskeletal:  Negative for myalgias.  Skin:  Negative for itching and rash.  Neurological:  Negative for dizziness, weakness and headaches.  Endo/Heme/Allergies:  Negative for polydipsia.       Objective:    Vitals BP (!) 160/82   Pulse 66   Temp 98.4 F (36.9 C)   Resp 18   Ht 5' (1.524 m)   Wt 137 lb 12.8 oz (62.5 kg)   SpO2 97%   BMI 26.91 kg/m    Physical Examination Physical Exam Constitutional:      Appearance: Normal appearance. She is not ill-appearing.  Cardiovascular:     Rate and Rhythm: Normal rate and regular rhythm.     Pulses: Normal pulses.     Heart sounds: No murmur heard.    No friction rub. No gallop.  Pulmonary:     Effort: Pulmonary effort is normal. No respiratory distress.     Breath sounds: No wheezing, rhonchi or rales.  Abdominal:     General: Bowel sounds are normal. There is no distension.     Palpations: Abdomen is soft.     Tenderness: There is no abdominal tenderness.  Musculoskeletal:     Right lower leg: No edema.     Left lower leg: No edema.  Skin:    General: Skin is warm and dry.     Findings: No rash.  Neurological:     General: No focal deficit present.     Mental Status: She is alert and oriented to person, place, and time.  Psychiatric:        Mood and Affect: Mood normal.        Behavior: Behavior normal.        Assessment & Plan:   Stage 3a chronic kidney disease (HCC) I had a discussion regarding NSAIDS.  I reviewed her chronological labs.  We will continue to monitor.  Memory loss She is going for neuropsychlogical testing.  I am going to increase her aricept to 10mg  daily.  Age-related osteoporosis without current pathological fracture We will obtain labs for her prolia injection.    Return in about 3 months (around 09/06/2023).   Crist Fat, MD

## 2023-06-09 LAB — CMP14 + ANION GAP
ALT: 15 [IU]/L (ref 0–32)
AST: 22 [IU]/L (ref 0–40)
Albumin: 4.5 g/dL (ref 3.7–4.7)
Alkaline Phosphatase: 66 [IU]/L (ref 44–121)
Anion Gap: 16 mmol/L (ref 10.0–18.0)
BUN/Creatinine Ratio: 31 — ABNORMAL HIGH (ref 12–28)
BUN: 31 mg/dL — ABNORMAL HIGH (ref 8–27)
Bilirubin Total: 0.3 mg/dL (ref 0.0–1.2)
CO2: 21 mmol/L (ref 20–29)
Calcium: 9.9 mg/dL (ref 8.7–10.3)
Chloride: 105 mmol/L (ref 96–106)
Creatinine, Ser: 1 mg/dL (ref 0.57–1.00)
Globulin, Total: 2.4 g/dL (ref 1.5–4.5)
Glucose: 108 mg/dL — ABNORMAL HIGH (ref 70–99)
Potassium: 4.3 mmol/L (ref 3.5–5.2)
Sodium: 142 mmol/L (ref 134–144)
Total Protein: 6.9 g/dL (ref 6.0–8.5)
eGFR: 57 mL/min/{1.73_m2} — ABNORMAL LOW (ref >59)

## 2023-06-09 LAB — CBC WITH DIFFERENTIAL/PLATELET
Basophils Absolute: 0.1 10*3/uL (ref 0.0–0.2)
Basos: 1 %
EOS (ABSOLUTE): 0.1 10*3/uL (ref 0.0–0.4)
Eos: 1 %
Hematocrit: 38.1 % (ref 34.0–46.6)
Hemoglobin: 12.7 g/dL (ref 11.1–15.9)
Immature Grans (Abs): 0 10*3/uL (ref 0.0–0.1)
Immature Granulocytes: 0 %
Lymphocytes Absolute: 2.1 10*3/uL (ref 0.7–3.1)
Lymphs: 29 %
MCH: 30.9 pg (ref 26.6–33.0)
MCHC: 33.3 g/dL (ref 31.5–35.7)
MCV: 93 fL (ref 79–97)
Monocytes Absolute: 0.6 10*3/uL (ref 0.1–0.9)
Monocytes: 9 %
Neutrophils Absolute: 4.4 10*3/uL (ref 1.4–7.0)
Neutrophils: 60 %
Platelets: 316 10*3/uL (ref 150–450)
RBC: 4.11 x10E6/uL (ref 3.77–5.28)
RDW: 12.2 % (ref 11.7–15.4)
WBC: 7.3 10*3/uL (ref 3.4–10.8)

## 2023-06-09 LAB — VITAMIN D 25 HYDROXY (VIT D DEFICIENCY, FRACTURES): Vit D, 25-Hydroxy: 73.7 ng/mL (ref 30.0–100.0)

## 2023-06-12 DIAGNOSIS — M810 Age-related osteoporosis without current pathological fracture: Secondary | ICD-10-CM | POA: Diagnosis not present

## 2023-06-12 LAB — URINE CULTURE

## 2023-06-12 NOTE — Progress Notes (Signed)
Her labs look good. There is no change to her CKD. Her Vit D level is normal.  Patient/Daughter aware of labs

## 2023-06-13 ENCOUNTER — Encounter: Payer: Self-pay | Admitting: Internal Medicine

## 2023-06-15 ENCOUNTER — Ambulatory Visit: Payer: Medicare HMO | Admitting: Psychology

## 2023-06-15 ENCOUNTER — Ambulatory Visit: Payer: Medicare HMO

## 2023-06-15 DIAGNOSIS — R413 Other amnesia: Secondary | ICD-10-CM

## 2023-06-15 NOTE — Progress Notes (Signed)
   Neuropsychology Note [place]. [facility] [place] Department of Neurology     Shannon Martin presented to her appointment on time and was willing to engage in the current evaluation. Unfortunately, she had been diagnosed with a UTI on 06/08/23, started medication on 06/10/23, and was still actively treating this condition at the time of her appointment. As an active UTI can create and worsen memory abilities and otherwise impair cognitive functions in general, testing was not deemed appropriate at the present time due to the risk of confounding and/or skewing current results. Her evaluation was rescheduled to 07/20/23 at 1:00.  I apologized for this inconvenience. Ms. Rivas and her daughter were gracious in their understanding.

## 2023-06-21 ENCOUNTER — Other Ambulatory Visit: Payer: Self-pay

## 2023-06-21 MED ORDER — FAMOTIDINE 40 MG PO TABS: ORAL_TABLET | ORAL | 0 refills | Status: DC

## 2023-06-25 ENCOUNTER — Encounter: Payer: Medicare HMO | Admitting: Psychology

## 2023-07-06 ENCOUNTER — Ambulatory Visit: Payer: Medicare HMO | Admitting: Internal Medicine

## 2023-07-09 ENCOUNTER — Encounter: Payer: Self-pay | Admitting: Student

## 2023-07-09 ENCOUNTER — Ambulatory Visit: Payer: Medicare HMO | Admitting: Student

## 2023-07-09 VITALS — BP 162/82 | HR 102 | Temp 98.8°F | Resp 18 | Ht 60.0 in | Wt 138.0 lb

## 2023-07-09 DIAGNOSIS — N390 Urinary tract infection, site not specified: Secondary | ICD-10-CM | POA: Insufficient documentation

## 2023-07-09 HISTORY — DX: Urinary tract infection, site not specified: N39.0

## 2023-07-09 LAB — POCT URINALYSIS DIPSTICK
Bilirubin, UA: NEGATIVE
Blood, UA: NEGATIVE
Glucose, UA: NEGATIVE
Ketones, UA: NEGATIVE
Nitrite, UA: POSITIVE
Protein, UA: POSITIVE — AB
Spec Grav, UA: 1.025 (ref 1.010–1.025)
Urobilinogen, UA: 0.2 U/dL
pH, UA: 5.5 (ref 5.0–8.0)

## 2023-07-09 MED ORDER — NITROFURANTOIN MONOHYD MACRO 100 MG PO CAPS: 100.0000 mg | ORAL_CAPSULE | Freq: Two times a day (BID) | ORAL | 0 refills | Status: AC

## 2023-07-09 NOTE — Patient Instructions (Signed)
 Probiotics and increase hydration. Tylenol for pain.   Pending urine culture.

## 2023-07-09 NOTE — Assessment & Plan Note (Addendum)
 Sending urine for culture. I will treat for UTI with macrobid  100 mg ONCE a day.  Discussed hygiene measures and encouraged proper oral hydration. I will not make any changes to Gemetesa or Tamsulosin . I will defer to Urology    Patient called and informed of change in dosage.

## 2023-07-09 NOTE — Progress Notes (Addendum)
 Acute Office Visit  Subjective:     Patient ID: Shannon Martin, female    DOB: 02-28-1941, 83 y.o.   MRN: [id number]  Chief Complaint  Patient presents with   office visit    Patient having uti for 4 days     HPI  Shannon Martin is a 83 year old female who presents with complaints of 4 days of frequency, urgency and pelvic pressure without radiation. She also notes a tinge of blood but no longer present.  On 06/08/23 she was seen for a UTI, urine culture identified 2 organisms, non predominant. She reports no symptoms at that time, she was placed on ciprofloxacin  500 mg.  She completed the entire course of antibiotics according to daughter who manages her medications. Today she denies fever, nausea or vomiting, or flank/abdominal pain. Her daughter notes the patients hygiene and daily intake of caffeinated beverages to be of concern. The patient will only bath/showering once every 2 weeks. Her asks if this could be a culprit to symptoms. The patient sees urology where treatment is she is supposed to be using Gemtesa  75 mg every 3 days and tamsulosin  0.4 mg twice daily.  She reports stopping Gemtesa  because flow of urine was not as strong. She continues using tamsulosin  instead.    Review of Systems  Constitutional: Negative.   Eyes: Negative.   Respiratory: Negative.    Cardiovascular: Negative.   Gastrointestinal: Negative.   Genitourinary:  Positive for frequency and urgency. Negative for flank pain and hematuria.  Musculoskeletal: Negative.   Neurological: Negative.         Objective:    BP (!) 162/82 (BP Location: Left Arm, Patient Position: Sitting, Cuff Size: Normal)   Pulse (!) 102   Temp 98.8 F (37.1 C)   Resp 18   Ht 5' (1.524 m)   Wt 138 lb (62.6 kg)   SpO2 98%   BMI 26.95 kg/m    Physical Exam Vitals reviewed.  Constitutional:      Appearance: Normal appearance.  Cardiovascular:     Rate and Rhythm: Normal rate and regular rhythm.     Pulses: Normal  pulses.     Heart sounds: Normal heart sounds.  Pulmonary:     Effort: Pulmonary effort is normal.     Breath sounds: Normal breath sounds.  Abdominal:     General: Bowel sounds are normal. There is no distension.     Palpations: Abdomen is soft.     Tenderness: There is no abdominal tenderness. There is no right CVA tenderness or left CVA tenderness.  Skin:    General: Skin is warm and dry.     Capillary Refill: Capillary refill takes less than 2 seconds.  Neurological:     Mental Status: She is alert and oriented to person, place, and time. Mental status is at baseline.  Psychiatric:        Mood and Affect: Mood normal.        Behavior: Behavior normal.     Results for orders placed or performed in visit on 07/09/23  POCT urinalysis dipstick  Result Value Ref Range   Color, UA YELLOW    Clarity, UA CLEAR    Glucose, UA Negative Negative   Bilirubin, UA neg    Ketones, UA neg    Spec Grav, UA 1.025 1.010 - 1.025   Blood, UA neg    pH, UA 5.5 5.0 - 8.0   Protein, UA Positive (A) Negative   Urobilinogen,  UA 0.2 0.2 or 1.0 E.U./dL   Nitrite, UA pos    Leukocytes, UA Small (1+) (A) Negative   Appearance clear    Odor yes         Assessment & Plan:   Problem List Items Addressed This Visit     Urinary tract infection without hematuria - Primary   Sending urine for culture. I will treat for UTI with macrobid  100 mg ONCE a day.  Discussed hygiene measures and encouraged proper oral hydration. I will not make any changes to Gemetesa or Tamsulosin . I will defer to Urology    Patient called and informed of change in dosage.      Relevant Medications   nitrofurantoin , macrocrystal-monohydrate, (MACROBID ) 100 MG capsule   Other Relevant Orders   POCT urinalysis dipstick (Completed)   Urine Culture    Meds ordered this encounter  Medications   nitrofurantoin , macrocrystal-monohydrate, (MACROBID ) 100 MG capsule    Sig: Take 1 capsule (100 mg total) by mouth 2 (two)  times daily for 7 days.    Dispense:  14 capsule    Refill:  0    Return if symptoms worsen or fail to improve.  Nasario Badder, NP

## 2023-07-10 DIAGNOSIS — I4891 Unspecified atrial fibrillation: Secondary | ICD-10-CM | POA: Diagnosis not present

## 2023-07-10 DIAGNOSIS — Z7901 Long term (current) use of anticoagulants: Secondary | ICD-10-CM | POA: Diagnosis not present

## 2023-07-10 DIAGNOSIS — N390 Urinary tract infection, site not specified: Secondary | ICD-10-CM | POA: Diagnosis not present

## 2023-07-10 DIAGNOSIS — J45909 Unspecified asthma, uncomplicated: Secondary | ICD-10-CM | POA: Diagnosis not present

## 2023-07-10 DIAGNOSIS — I10 Essential (primary) hypertension: Secondary | ICD-10-CM | POA: Diagnosis not present

## 2023-07-10 DIAGNOSIS — R54 Age-related physical debility: Secondary | ICD-10-CM | POA: Diagnosis not present

## 2023-07-10 DIAGNOSIS — R55 Syncope and collapse: Secondary | ICD-10-CM | POA: Diagnosis not present

## 2023-07-10 DIAGNOSIS — R404 Transient alteration of awareness: Secondary | ICD-10-CM | POA: Diagnosis not present

## 2023-07-10 DIAGNOSIS — Z79899 Other long term (current) drug therapy: Secondary | ICD-10-CM | POA: Diagnosis not present

## 2023-07-10 DIAGNOSIS — F09 Unspecified mental disorder due to known physiological condition: Secondary | ICD-10-CM | POA: Diagnosis not present

## 2023-07-10 DIAGNOSIS — K219 Gastro-esophageal reflux disease without esophagitis: Secondary | ICD-10-CM | POA: Diagnosis not present

## 2023-07-11 DIAGNOSIS — I4891 Unspecified atrial fibrillation: Secondary | ICD-10-CM | POA: Diagnosis not present

## 2023-07-11 DIAGNOSIS — F09 Unspecified mental disorder due to known physiological condition: Secondary | ICD-10-CM | POA: Diagnosis not present

## 2023-07-11 DIAGNOSIS — R569 Unspecified convulsions: Secondary | ICD-10-CM | POA: Diagnosis not present

## 2023-07-11 DIAGNOSIS — R0602 Shortness of breath: Secondary | ICD-10-CM | POA: Diagnosis not present

## 2023-07-11 DIAGNOSIS — R55 Syncope and collapse: Secondary | ICD-10-CM | POA: Diagnosis not present

## 2023-07-11 DIAGNOSIS — R4182 Altered mental status, unspecified: Secondary | ICD-10-CM | POA: Diagnosis not present

## 2023-07-11 LAB — URINE CULTURE

## 2023-07-12 DIAGNOSIS — F09 Unspecified mental disorder due to known physiological condition: Secondary | ICD-10-CM | POA: Diagnosis not present

## 2023-07-12 DIAGNOSIS — I4891 Unspecified atrial fibrillation: Secondary | ICD-10-CM | POA: Diagnosis not present

## 2023-07-12 DIAGNOSIS — R4182 Altered mental status, unspecified: Secondary | ICD-10-CM | POA: Diagnosis not present

## 2023-07-13 NOTE — Progress Notes (Signed)
Please let the patients daughter know that her culture grew E. Coli. The antibiotic is appropriate to treat this bacteria. Hope she is feeling better.

## 2023-07-20 ENCOUNTER — Ambulatory Visit (INDEPENDENT_AMBULATORY_CARE_PROVIDER_SITE_OTHER): Payer: Medicare HMO | Admitting: Psychology

## 2023-07-20 ENCOUNTER — Encounter: Payer: Self-pay | Admitting: Psychology

## 2023-07-20 ENCOUNTER — Ambulatory Visit: Payer: Medicare HMO | Admitting: Psychology

## 2023-07-20 DIAGNOSIS — R4189 Other symptoms and signs involving cognitive functions and awareness: Secondary | ICD-10-CM

## 2023-07-20 DIAGNOSIS — G3184 Mild cognitive impairment, so stated: Secondary | ICD-10-CM

## 2023-07-20 HISTORY — DX: Mild cognitive impairment of uncertain or unknown etiology: G31.84

## 2023-07-20 NOTE — Progress Notes (Unsigned)
NEUROPSYCHOLOGICAL EVALUATION [place]. [facility] [place] Department of Neurology  Date of Evaluation: July 20, 2023  Reason for Referral:   Shannon Martin is a 83 y.o. right-handed Caucasian female referred by Marlowe Kays, PA-C, to characterize her current cognitive functioning and assist with diagnostic clarity and treatment planning in the context of subjective cognitive decline.   Assessment and Plan:   Clinical Impression(s): Shannon Martin pattern of performance is suggestive of a relative weakness across semantic fluency and further performance variability across visuospatial abilities and verbal learning and memory. No cognitive domain exhibited consistent impairment. Performances were appropriate relative to age-matched peers across processing speed, attention/concentration, executive functioning, receptive language, phonemic fluency, confrontation naming, and visual memory. Functionally, Shannon Martin no longer drives and her family has taken over medication management, financial management, and bill paying responsibilities. Despite concerns surrounding functional impairment, [name] exhibits a relatively benign cognitive profile, certainly not suggestive of dementia level impairment. 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  The etiology for ongoing dysfunction remains unclear. Medical records suggest that Shannon Martin's urinalysis on 07/09/2023 grew e coli and she was started on antibiotics. As it had been greater than seven days from the start of this antibiotic and [name] reported no active symptoms, we agreed to continue with the current evaluation as planned. However, despite being beyond the typical recovery period, there remains the potential that her UTI had not yet fully cleared. If this were the case, current weakness/variability could certainly be attributed to her  UTI experience.  Neurologically speaking, Shannon Martin's current pattern is nonspecific in nature and does not suggest any particular illness in a compelling fashion. Concerns were expressed surrounding underlying Lewy body disease. Indeed, Shannon Martin does report experiences concerning for REM sleep behaviors and visual hallucinations. Some of what her daughter has observed behaviorally could also reflect fluctuations in alertness common in this illness. However, testing does not currently align in a particularly compelling manner as executive functioning was appropriate and visuospatial abilities were variable but often intact.   Concerns were also expressed for Alzheimer's disease. Across memory testing, Shannon Martin did have greater difficulty learning a novel list of words relative to story content. After a brief delay, she was amnestic towards this list (i.e., 0% retention). However, she responded well to cueing with a perfect 20/20 performance across a recognition trial. Performances were likewise appropriate across story and figure-based tasks. Speaking in a very broad sense, difficulties with learning and memory, semantic fluency, and visuospatial abilities do align fairly well with a typical Alzheimer's disease progression. However, there is not compelling evidence to suggest prominent memory impairment at the present time or memory dysfunction in a pattern which would strongly point to Alzheimer's disease as the underlying cause.  Despite her benign profile, Shannon Martin's daughter's report of day-to-day functioning remains concerning. There certainly remains the possibility of an underlying neurodegenerative illness that remains in extremely early stages not yet clearly discernable via cognitive testing. However, as stated above, testing patterns are nonspecific and do not point to any illness in particular in a compelling fashion. Continued medical monitoring will be important moving forward.    Recommendations: If concerns for early stages of Lewy body disease are held by her medical team, A DaTscan or alpha-synuclein skin biopsy could be considered. This could be helpful in ruling in or out something in the parkinsonian family. Similarly, a lumbar puncture or biomarker blood  test could be considered for underlying Alzheimer's disease concerns.   A repeat neuropsychological evaluation in 18-24 months (or sooner if functional decline is noted) could be considered to assess the trajectory of future cognitive decline should it occur.  Performance across neurocognitive testing is not a strong predictor of an individual's safety operating a motor vehicle. Should her family wish to pursue a formalized driving evaluation, they could reach out to the following agencies: [employer] in [place]: [phone] [facility]: [phone] [facility]: [phone] [facility]: [phone] or [phone]  It will likely be helpful for Shannon Martin to have another person with her when in situations where she may need to process information, weigh the pros and cons of different options, and make decisions, in order to ensure that she fully understands and recalls all information to be considered. She will likely benefit from the establishment and maintenance of a routine in order to maximize her functional abilities over time.  If not already done, Shannon Martin and her family may want to discuss her wishes regarding durable power of attorney and medical decision making, so that she can have input into these choices. If they require legal assistance with this, long-term care resource access, or other aspects of estate planning, they could reach out to [employer] at [phone] for a free consultation.  Shannon Martin is encouraged to attend to lifestyle factors for brain health (e.g., regular physical exercise, good nutrition habits and consideration  of the MIND-DASH diet, regular participation in cognitively-stimulating activities, and general stress management techniques), which are likely to have benefits for both emotional adjustment and cognition. In fact, in addition to promoting good general health, regular exercise incorporating aerobic activities (e.g., brisk walking, jogging, cycling, etc.) has been demonstrated to be a very effective treatment for depression and stress, with similar efficacy rates to both antidepressant medication and psychotherapy. Optimal control of vascular risk factors (including safe cardiovascular exercise and adherence to dietary recommendations) is encouraged. Continued participation in activities which provide mental stimulation and social interaction is also recommended.   Memory can be improved using internal strategies such as rehearsal, repetition, chunking, mnemonics, association, and imagery. External strategies such as written notes in a consistently used memory journal, visual and nonverbal auditory cues such as a calendar on the refrigerator or appointments with alarm, such as on a cell phone, can also help maximize recall.    When learning new information, she would benefit from information being broken up into small, manageable pieces. She may also find it helpful to articulate the material in her own words and in a context to promote encoding at the onset of a new task. This material may need to be repeated multiple times to promote encoding. Because she shows better recall for structured information, she will likely understand and retain new information better if it is presented to her in a meaningful or well-organized manner at the outset, such as grouping items into meaningful categories or presenting information in an outlined, bulleted, or story format.  To address problems with fluctuating attention and/or executive dysfunction, she may wish to consider:   -Avoiding external distractions when needing  to concentrate   -Limiting exposure to fast paced environments with multiple sensory demands   -Writing down complicated information and using checklists   -Attempting and completing one task at a time (i.e., no multi-tasking)   -Verbalizing aloud each step of a task to maintain focus   -Taking frequent breaks during the completion of  steps/tasks to avoid fatigue   -Reducing the amount of information considered at one time   -Scheduling more difficult activities for a time of day where she is usually most alert  Review of Records:   Ms. Apsey was seen by [facility] Marlowe Kays, PA-C) on 05/07/2023 for an evaluation of memory loss. At that time, difficulties were said to be present for the past three years. Per the patient, this focused on "little things, especially if the material is [not] important." Per her daughter, deficits have been present for the past 3-4 years, including concern for confabulation and rapid forgetting (e.g., names, recent conversations). Functionally, Ms. Kluck has moved into her daughter's home and family is involved with instrumental ADLs. Performance on a brief cognitive screening instrument (MOCA) was 22/30. Ultimately, Ms. Danner was referred for a comprehensive neuropsychological evaluation to characterize her cognitive abilities and to assist with diagnostic clarity and treatment planning.   Neuroimaging: Brain MRI on 08/07/2022 was unremarkable.  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   GERD (gastroesophageal reflux disease)    Hip fracture 06/30/2022   IBS (irritable bowel syndrome) 03/22/2022   Iliotibial band syndrome of left side 06/17/2015   Internal  hemorrhoid, bleeding 05/24/2022   Laryngopharyngeal reflux (LPR) 03/24/2015   Left knee pain 10/01/2014   Migraine without aura and without status migrainosus, not intractable 07/28/2019   Mixed hyperlipidemia 12/11/2018   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   Overactive bladder    Takes Vesicare   PAF (paroxysmal atrial fibrillation) 05/04/2021   Primary osteoarthritis of left knee 12/03/2014   Proctitis 02/15/2022   S/p left hip fracture 07/29/2022   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   Seborrheic dermatitis 12/21/2020   Stage 3a chronic kidney disease 06/08/2023   Urinary retention with incomplete bladder emptying 06/30/2022   Urinary tract infection without hematuria 07/09/2023    Past Surgical History:  Procedure Laterality Date   BREAST BIOPSY Left ~ 1973   CATARACT EXTRACTION W/ INTRAOCULAR LENS  IMPLANT, BILATERAL Bilateral 2003   CHEST TUBE INSERTION  January 2015   X 2 at [facility]   DILATION AND CURETTAGE OF UTERUS  4-5   JOINT REPLACEMENT     REPLACEMENT TOTAL KNEE Left    TONSILLECTOMY AND ADENOIDECTOMY  1946; 1947   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   Procedure: TOTAL HIP ARTHROPLASTY;  Surgeon: Joen Laura, MD;  Location: [facility] OR;  Service: Orthopedics;  Laterality: Left;   TOTAL KNEE ARTHROPLASTY Left 01/11/2015   TOTAL KNEE ARTHROPLASTY Left 01/11/2015   Procedure: Left TOTAL KNEE ARTHROPLASTY;  Surgeon: Dannielle Huh, MD;  Location: [facility] OR;  Service: Orthopedics;  Laterality: Left;   VIDEO ASSISTED THORACOSCOPY (VATS)/THOROCOTOMY Left 07/13/2013   VATS with insertion of chest tubes    Current Outpatient Medications:    apixaban (ELIQUIS) 5 MG TABS tablet, Take 1 tablet (5 mg total) by mouth 2 (two) times daily., Disp: 180 tablet, Rfl: 1   atorvastatin (LIPITOR) 40 MG tablet, Take 1 tablet (40 mg total) by mouth daily., Disp: 90 tablet, Rfl: 3   B Complex-C-Calcium (B-COMPLEX/VITAMIN C, W/ CA, PO), Take 1 capsule by mouth daily., Disp: , Rfl:     budesonide-formoterol (SYMBICORT) 80-4.5 MCG/ACT inhaler, INHALE 2 PUFFS INTO LUNGS TWICE DAILY,  IN THE MORNING AND EVENING, Disp: 30.6 g, Rfl: 6   ciprofloxacin (CIPRO) 500 MG tablet, Take 1 tablet (500 mg total) by mouth 2 (two) times daily., Disp: 10 tablet, Rfl: 0   donepezil (ARICEPT) 10 MG tablet, Take 1 tablet (10 mg total) by mouth at bedtime., Disp: 30 tablet, Rfl: 5   estradiol (ESTRACE) 0.1 MG/GM vaginal cream, Place 1 Applicatorful vaginally at bedtime., Disp: , Rfl:    famotidine (PEPCID) 40 MG tablet, TAKE 1 TABLET(40 MG) BY MOUTH TWICE DAILY, Disp: 180 tablet, Rfl: 0   fluticasone (FLONASE) 50 MCG/ACT nasal spray, Can use one spray in each nostril once daily as directed., Disp: 48 g, Rfl: 1   hydrocortisone (ANUSOL-HC) 2.5 % rectal cream, Place 1 Application rectally 2 (two) times daily., Disp: 30 g, Rfl: 0   Hypromellose (ARTIFICIAL TEARS OP), Apply 1 drop to eye as needed for dry eyes., Disp: , Rfl:    loratadine (CLARITIN) 10 MG tablet, Take 10 mg by mouth daily., Disp: , Rfl:    Multiple Vitamins-Minerals (MULTIVITAMIN PO), Take 1 tablet by mouth daily., Disp: , Rfl:    PROLIA 60 MG/ML SOSY injection, Inject 60 mg into the skin every 6 (six) months., Disp: , Rfl:    tamsulosin (FLOMAX) 0.4 MG CAPS capsule, Take 0.4 mg by mouth daily., Disp: , Rfl:    VITAMIN E PO, Take 400 Units by mouth 2 (two) times daily., Disp: , Rfl:   Clinical Interview:   The following information was obtained during a clinical interview with Ms. Crockett and her daughter prior to cognitive testing.  Cognitive Symptoms: Decreased short-term memory: Endorsed. Ms. Kehm largely downplayed memory concerns, noting "a few" memory lapses here and there which were "not major" incidents. She noted a longstanding weakness recalling names and some difficulties misplacing things around her residence. Her daughter described more prominent memory concerns, largely surrounding rapid forgetting. Her daughter  provided an example of Ms. Lizotte being unable to recall the alarm code or how to work this machine despite some familiarity with it and the code for many years prior. Per her daughter, Ms. Wideman has exhibited some progressive memory decline over the past 3-4 years.  Decreased long-term memory: Denied. Decreased attention/concentration: Endorsed "occasionally." Reduced processing speed: Denied. Difficulties with executive functions: Endorsed. However, she generally minimized concerns, attributing some trouble with multi-tasking to age-related changes. She and her daughter denied increased impulsivity or any significant personality changes Difficulties with emotion regulation: Denied. Difficulties with receptive language: Denied. Difficulties with word finding: Endorsed "occasionally." Decreased visuoperceptual ability: Denied.  Difficulties completing ADLs: Her daughter has fully taken over medication management. This was brought on by Ms. Tippens experiencing significant adherence difficulties due to suspected forgetfulness. Her daughter also fully manages finances and bill paying responsibilities due to previously identified difficulty/concerns. Ms. Amodei no longer drives.   Additional Medical History: History of traumatic brain injury/concussion: Unclear. She did report a few falls several years prior in her bathroom. In each instance, she entered this room in the dark and got her legs "tangled" beneath her, resulting in her tripping. During her most recent fall, she noted her head striking the sink. No losses in consciousness were noted and they were unaware of any formally diagnosed concussive experiences.  History of stroke: Denied. History of seizure activity: Her daughter described an event in early 2020 where Ms. Deyoe abruptly dropped her head forward and experienced shakiness in her hands bilaterally. This persisted for a short period and Ms. Dissinger had no  recollection of this event  after she regained awareness. Additionally, within the past 1-2 weeks, her daughter described an event where Ms. Martello was unable to be aroused for 20-30 minutes. In this time, family, as well as individuals from the fire department and EMT were unable to arouse her. She was taken to a local ED and they were unable to determine the cause of this event. Seizure activity was suggested as a plausible explanation. No other witnessed seizure events were reported. History of known exposure to toxins: Denied. Symptoms of chronic pain: Denied. Experience of frequent headaches/migraines: She reported a remote history of migraine headaches which dissipated with the initiation of menopause. Lately, she has described some aura experiences and mild symptoms. These are currently treated well with OTC medications.  Frequent instances of dizziness/vertigo: Denied.  Sensory changes: She wears glasses with benefit. Other sensory changes/difficulties were denied.  Balance/coordination difficulties: As stated above, she has had a few falls in the past. This past January 2024, she again fell in her bathroom, resulting in a hip fracture. She has since moved in with her daughter. Broadly speaking, she denied prominent instability and denied frequent falling behaviors. She did report being more cautious while ambulating since her hip fracture.  Other motor difficulties: Denied.  Other medical conditions: She has a history of prior UTI experiences. Her initial appointment with myself on 06/15/2023 had to be postponed due to active symptomatology and her recently started an antibiotic. On 07/09/2023, a urinalysis suggested that her culture grew e coli and she was again started on an antibiotic. As it had been greater than seven days from the start of this antibiotic and Ms. Roussel reporting no active symptoms, we agreed to continue with the current evaluation as planned.   Sleep History: Estimated hours obtained each night:  Unclear. She described her sleep as poor and very broken in nature.  Difficulties falling asleep: Denied. Difficulties staying asleep: Endorsed. She reported frequently waking throughout the night to use the restroom. She will often have trouble falling back asleep and there are common instances where she may wake around 4:30 am and be unable to fall back asleep at all.  Feels rested and refreshed upon awakening: Endorsed. However, she will fatigue as the day progresses.   History of snoring: Denied. History of waking up gasping for air: Denied. Witnessed breath cessation while asleep: Denied.  History of vivid dreaming: Endorsed. Excessive movement while asleep: Denied. Instances of acting out her dreams: Denied. Medical records do suggest prior concerns surrounding REM sleep behaviors and "shadowboxing" observations.   Psychiatric/Behavioral Health History: Depression: She described her mood as "meh" at times, emphasizing a combination of good and bad days. She denied to her knowledge any mental health concerns or formal diagnoses. Current or remote suicidal ideation, intent, or plan was denied.  Anxiety: Denied. Mania: Denied. Trauma History: Denied. Visual/auditory hallucinations: Unclear. She was vague but did note vivid dreaming and that distressing images seem to persist after awakening. She noted that she will commonly lay for 5-10 minutes after waking until these images dissipate. These may reflect hypnopompic hallucination experiences. Her daughter also described an isolated incident where Ms. Poster woke in the middle of the night claiming that she was seeing spiders all over the walls and in her room. Her daughter noted more common instances where Ms. Madariaga will be awake and seemingly having full conversations with herself in her bedroom. Ms. Frankowski stated that she is simply talking aloud to herself to process thoughts or  emotions in these moments. Delusional thoughts: Her daughter  described an event this past Spring where they were vacationing together as a family at a nearby beach. At 2:30 in the morning, Ms. Pala apparently woke, was disoriented and unsure of where she was, packed a bag of clothes, and contacted the local authorities expressing fear. She was also noted in telling the authorities that there were individuals in the home who she was not sure if they were "alive, dead, or asleep."   Tobacco: Denied. Alcohol: She reported very infrequent alcohol consumption and denied a history of problematic alcohol abuse or dependence.  Recreational drugs: Denied.  Family History: Problem Relation Age of Onset   Hypertension Mother    Diabetes Father    Hypertension Maternal Grandmother    Hypertension Maternal Grandfather    This information was confirmed by Ms. Abdelrahman.  Academic/Vocational History: Highest level of educational attainment: 12 years. She graduated from high school and described herself as a good (mostly A) student in academic settings. No relative weaknesses were identified.  History of developmental delay: Denied. History of grade repetition: Denied. Enrollment in special education courses: Denied. History of LD/ADHD: Denied.  Employment: Retired. 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   Evaluation Results:   Behavioral Observations: Ms. Crespo was accompanied by her daughter, arrived to her appointment on time, and was appropriately dressed and groomed. She appeared alert. Observed gait and station were within normal limits. Gross motor functioning appeared intact upon informal observation and no abnormal movements (e.g., tremors) were noted. Her affect was generally relaxed and positive, but did range appropriately given the subject being discussed during interview. There were periods of mild defensiveness as she attempted to rationalize aspects of her  daughter's account of ongoing concerns. Spontaneous speech was fluent and word finding difficulties were not observed during the clinical interview. Thought processes were coherent, organized, and normal in content. Insight into her cognitive difficulties appeared adequate.   During testing, sustained attention was appropriate. Task engagement was adequate and she persisted when challenged. Overall, Ms. Valentine was cooperative with the clinical interview and subsequent testing procedures.   Adequacy of Effort: The validity of neuropsychological testing is limited by the extent to which the individual being tested may be assumed to have exerted adequate effort during testing. Ms. Bodley expressed her intention to perform to the best of her abilities and exhibited adequate task engagement and persistence. Scores across stand-alone and embedded performance validity measures were within expectation. As such, the results of the current evaluation are believed to be a valid representation of Ms. Shiffman's current cognitive functioning.  Test Results: Ms. Arey was largely oriented at the time of the current evaluation. She was two days off when stating the current date  Intellectual abilities based upon educational and vocational attainment were estimated to be in the average range. Premorbid abilities were estimated to be within the above average range based upon a single-word reading test.   Processing speed was average to well above average. Basic attention was well above average. More complex attention (e.g., working memory) was average to well above average. Executive functioning was average to above average.  Assessed receptive language abilities were above average. Likewise, Ms. Schleyer did not exhibit any difficulties comprehending task instructions and answered all questions asked of her appropriately. Assessed expressive language was somewhat variable. Phonemic fluency was average, semantic  fluency was well below average to below average, and confrontation naming  was average.   Assessed visuospatial/visuoconstructional abilities were variable, ranging from the impaired to above average normative ranges.   Learning (i.e., encoding) of novel verbal information was somewhat variable but overall appropriate, ranging from the below average to exceptionally high normative ranges. Spontaneous delayed recall (i.e., retrieval) of previously learned information was exceptionally low across a list learning task but below average to above average across figure and story-based task respectively. Retention rates were 0% across a list learning task, 83% across a story learning task, and 40% across a figure drawing task. Performance across recognition tasks was average to above average, suggesting evidence for information consolidation.   Results of emotional screening instruments suggested that recent symptoms of generalized anxiety were in the minimal to mild range, while symptoms of depression were within the mild range. A screening instrument assessing recent sleep quality suggested the presence of moderate sleep dysfunction.  Tables of Scores:   Note: This summary of test scores accompanies the interpretive report and should not be considered in isolation without reference to the appropriate sections in the text. Descriptors are based on appropriate normative data and may be adjusted based on clinical judgment. Terms such as "Within Normal Limits" and "Outside Normal Limits" are used when a more specific description of the test score cannot be determined.       Percentile - Normative Descriptor > 98 - Exceptionally High 91-97 - Well Above Average 75-90 - Above Average 25-74 - Average 9-24 - Below Average 2-8 - Well Below Average < 2 - Exceptionally Low       Validity:   DESCRIPTOR       DCT: --- --- Within Normal Limits  RBANS EI: --- --- Within Normal Limits  WAIS-IV RDS: --- --- Within  Normal Limits       Orientation:      Raw Score Percentile   NAB Orientation, Form 1 27/29 --- ---       Cognitive Screening:      Raw Score Percentile   SLUMS: 27/30 --- ---       RBANS, Form A: Standard Score/ Scaled Score Percentile   Total Score 101 53 Average  Immediate Memory 106 66 Average    List Learning 6 9 Below Average    Story Memory 16 98 Exceptionally High  Visuospatial/Constructional 112 79 Above Average    Figure Copy 14 91 Well Above Average    Line Orientation 16/20 26-50 Average  Language 86 18 Below Average    Picture Naming 10/10 >75 Above Average    Semantic Fluency 4 2 Well Below Average  Attention 106 66 Average    Digit Span 14 91 Well Above Average    Coding 8 25 Average  Delayed Memory 98 45 Average    List Recall 0/10 <2 Exceptionally Low    List Recognition 20/20 51-75 Average    Story Recall 12 75 Above Average    Story Recognition 12/12 87+ Above Average    Figure Recall 7 16 Below Average    Figure Recognition 7/8 69-83 Average to  Above Average        Intellectual Functioning:      Standard Score Percentile   Test of Premorbid Functioning: 113 81 Above Average       Attention/Executive Function:     Trail Making Test (TMT): Raw Score (Scaled Score) Percentile     Part A 46 secs.,  0 errors (10) 50 Average    Part B 93 secs.,  0 errors (13)  84 Above Average  *Based on [employer] Older Normative Studies (MOANS)           Scaled Score Percentile   WAIS-IV Digit Span: 14 91 Well Above Average    Forward 14 91 Well Above Average    Backward 15 95 Well Above Average    Sequencing 11 63 Average        Scaled Score Percentile   WAIS-IV Similarities: 12 75 Above Average       D-KEFS Color-Word Interference Test: Raw Score (Scaled Score) Percentile     Color Naming 23 secs. (15) 95 Well Above Average    Word Reading 20 secs. (13) 84 Above Average    Inhibition 59 secs. (14) 91 Well Above Average      Total Errors 2 errors (11) 63  Average    Inhibition/Switching 90 secs. (11) 63 Average      Total Errors 7 errors (7) 16 Below Average       Language:     Verbal Fluency Test: Raw Score (Scaled Score) Percentile     Phonemic Fluency (CFL) 27 (9) 37 Average    Category Fluency 28 (7) 16 Below Average  *Based on [employer] Older Normative Studies (MOANS)          NAB Language Module, Form 1: T Score Percentile     Auditory Comprehension 59 82 Above Average    Naming 28/31 (48) 42 Average       Visuospatial/Visuoconstruction:      Raw Score Percentile   Clock Drawing: 10/10 --- Within Normal Limits  Hooper VOT: 20/30 --- Moderate Probability  of Impairment       NAB Spatial Module, Form 1: T Score Percentile     Visual Discrimination 41 18 Below Average        Scaled Score Percentile   WAIS-IV Block Design: 12 75 Above Average       Mood and Personality:      Raw Score Percentile   Geriatric Depression Scale: 11 --- Mild  Geriatric Anxiety Scale: 8 --- Minimal    Somatic 7 --- Mild    Cognitive 0 --- Minimal    Affective 1 --- Minimal       Additional Questionnaires:      Raw Score Percentile   PROMIS Sleep Disturbance Questionnaire: 33 --- Moderate   Informed Consent and Coding/Compliance:   The current evaluation represents a clinical evaluation for the purposes previously outlined by the referral source and is in no way reflective of a forensic evaluation.   Ms. Korf was provided with a verbal description of the nature and purpose of the present neuropsychological evaluation. Also reviewed were the foreseeable risks and/or discomforts and benefits of the procedure, limits of confidentiality, and mandatory reporting requirements of this provider. The patient was given the opportunity to ask questions and receive answers about the evaluation. Oral consent to participate was provided by the patient.   This evaluation was conducted by Newman Nickels, Ph.D., ABPP-CN, board certified clinical  neuropsychologist. Ms. Calder completed a clinical interview with Dr. Milbert Coulter, billed as one unit [phone], and 120 minutes of cognitive testing and scoring, billed as one unit [phone] and three additional units [id number]. Psychometrist Wallace Keller, B.S. assisted Dr. Milbert Coulter with test administration and scoring procedures. As a separate and discrete service, one unit [id number] and two units [phone] were billed for Dr. Tammy Sours time spent in interpretation and report writing.

## 2023-07-20 NOTE — Progress Notes (Signed)
   Psychometrician Note   Cognitive testing was administered to Shannon Martin by Wallace Keller, B.S. (psychometrist) under the supervision of Dr. Newman Nickels, Ph.D., licensed psychologist on 07/20/2023. Ms. Dimmick did not appear overtly distressed by the testing session per behavioral observation or responses across self-report questionnaires. Rest breaks were offered.    The battery of tests administered was selected by Dr. Newman Nickels, Ph.D. with consideration to Ms. Lenn's current level of functioning, the nature of her symptoms, emotional and behavioral responses during interview, level of literacy, observed level of motivation/effort, and the nature of the referral question. This battery was communicated to the psychometrist. Communication between Dr. Newman Nickels, Ph.D. and the psychometrist was ongoing throughout the evaluation and Dr. Newman Nickels, Ph.D. was immediately accessible at all times. Dr. Newman Nickels, Ph.D. provided supervision to the psychometrist on the date of this service to the extent necessary to assure the quality of all services provided.    Shannon Martin will return within approximately 1-2 weeks for an interactive feedback session with Dr. Milbert Coulter at which time her test performances, clinical impressions, and treatment recommendations will be reviewed in detail. Ms. Tillison understands she can contact our office should she require our assistance before this time.  A total of 120 minutes of billable time were spent face-to-face with Ms. Valiente by the psychometrist. This includes both test administration and scoring time. Billing for these services is reflected in the clinical report generated by Dr. Newman Nickels, Ph.D.  This note reflects time spent with the psychometrician and does not include test scores or any clinical interpretations made by Dr. Milbert Coulter. The full report will follow in a separate note.

## 2023-07-23 ENCOUNTER — Ambulatory Visit: Payer: Medicare HMO | Admitting: Internal Medicine

## 2023-07-23 ENCOUNTER — Encounter: Payer: Self-pay | Admitting: Internal Medicine

## 2023-07-23 VITALS — BP 168/86 | HR 73 | Temp 98.2°F | Resp 18 | Ht 60.0 in | Wt 138.0 lb

## 2023-07-23 DIAGNOSIS — G3184 Mild cognitive impairment, so stated: Secondary | ICD-10-CM

## 2023-07-23 NOTE — Assessment & Plan Note (Signed)
I reviewed her records from [facility] and I reviewed her neuropsychological testing.  They felt she had mild cognitive impairment with her testing.  At [facility], they were sure what the cause of her transient loss of responsiveness was caused by.  I have asked her to continue all her meds.  If this occurs again, she is to go to [facility].

## 2023-07-23 NOTE — Progress Notes (Signed)
Office Visit  Subjective   Patient ID: Shannon Martin   DOB: 1940-12-22   Age: 83 y.o.   MRN: [id number]   Chief Complaint Chief Complaint  Patient presents with   Follow-up    Hospital follow up     History of Present Illness Shannon Martin is a 83 yo female who comes in today for a hospital follow where she was admitted to [facility] from 07/11/2023 until 07/12/2023.  At that time, she was sitting on her couch and became unresponsive and difficult to wake.  In the ER, her workup was unremarkable with labs and EKG normal.  A CT of the head was negative.  Teleneurology was consulted and recommended a MRI of the brain and EEG.  A MRI of the head wwas done on 07/11/2023 which showed bilateral senescent small vessel ischemic changes.  Her EEG done on the same day showed no clear epileptiform activity but there was slow transients noted and slight focal neurophysiological disturbance in her the temporal regions bilaterally.  An ECHO was done on 07/11/2023 showed a LVEF 55-60% and she had impairment of her LV diastolic function.  There was trace aortic regurgitation.  She has a history of underlying cognitive dysfunction and the hospitalists were not sure what the etiology of her unresponsive episode.  She does have a neuropsychology testing done which was done last week on 07/20/2023.  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 Her testing showed a pattern of performance suggestive of a relative weakness across semantic fluency and further performance variability across visuospatial abilities and verbal learning and memory. No cognitive domain exhibited consistent impairment. Performances were appropriate relative to age-matched peers across processing speed, attention/concentration, executive functioning, receptive language, phonemic fluency, confrontation naming, and visual memory. Functionally, Shannon Martin no longer drives and her family has taken over medication  management, financial management, and bill paying responsibilities. Despite concerns surrounding functional impairment, Shannon Martin exhibits a relatively benign cognitive profile, certainly not suggestive of dementia level impairment. 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  In regards to her admission, she was not sent home on any medications and they wanted her to come to me for followed.  She was hemodynamically stable at discharge.     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   GERD (gastroesophageal reflux disease)    Hip fracture 06/30/2022   IBS (irritable bowel syndrome) 03/22/2022   Iliotibial band syndrome of left side 06/17/2015   Internal hemorrhoid, bleeding 05/24/2022   Laryngopharyngeal reflux (LPR) 03/24/2015   Left knee pain 10/01/2014   Migraine without aura and without status migrainosus, not intractable 07/28/2019   Mild cognitive impairment of uncertain or unknown etiology 07/20/2023   Mixed hyperlipidemia 12/11/2018   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   Overactive bladder    Takes Vesicare   PAF (paroxysmal atrial fibrillation) 05/04/2021   Primary osteoarthritis of left knee 12/03/2014   Proctitis 02/15/2022   S/p left hip fracture 07/29/2022   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   Seborrheic dermatitis 12/21/2020   Stage 3a chronic kidney disease 06/08/2023   Urinary retention with incomplete bladder emptying  06/30/2022   Urinary tract infection without hematuria 07/09/2023     Allergies Allergies  Allergen Reactions   Tetanus  Toxoids Swelling and Other (See Comments)    Swelling around throat and jaws   Bee Venom Swelling and Other (See Comments)    Crusty area on skin   Hornet Venom Swelling    Crusty area on skin   Dilaudid [Hydromorphone] Nausea And Vomiting    Causes nausea and vomiting   Erythromycin Nausea And Vomiting   Fiorinal [Butalbital-Aspirin-Caffeine]     Altered mental status   Amoxicillin Other (See Comments)    dizzy   Doxycycline Other (See Comments)    dizzy   Latex Other (See Comments)    Skin will crack open and bleed   Neomycin Other (See Comments)    Irritates skin   Penicillins Itching, Swelling and Rash     Medications  Current Outpatient Medications:    apixaban (ELIQUIS) 5 MG TABS tablet, Take 1 tablet (5 mg total) by mouth 2 (two) times daily., Disp: 180 tablet, Rfl: 1   atorvastatin (LIPITOR) 40 MG tablet, Take 1 tablet (40 mg total) by mouth daily., Disp: 90 tablet, Rfl: 3   B Complex-C-Calcium (B-COMPLEX/VITAMIN C, W/ CA, PO), Take 1 capsule by mouth daily., Disp: , Rfl:    budesonide-formoterol (SYMBICORT) 80-4.5 MCG/ACT inhaler, INHALE 2 PUFFS INTO LUNGS TWICE DAILY, IN THE MORNING AND EVENING, Disp: 30.6 g, Rfl: 6   ciprofloxacin (CIPRO) 500 MG tablet, Take 1 tablet (500 mg total) by mouth 2 (two) times daily., Disp: 10 tablet, Rfl: 0   donepezil (ARICEPT) 10 MG tablet, Take 1 tablet (10 mg total) by mouth at bedtime., Disp: 30 tablet, Rfl: 5   estradiol (ESTRACE) 0.1 MG/GM vaginal cream, Place 1 Applicatorful vaginally at bedtime., Disp: , Rfl:    famotidine (PEPCID) 40 MG tablet, TAKE 1 TABLET(40 MG) BY MOUTH TWICE DAILY, Disp: 180 tablet, Rfl: 0   fluticasone (FLONASE) 50 MCG/ACT nasal spray, Can use one spray in each nostril once daily as directed., Disp: 48 g, Rfl: 1   hydrocortisone (ANUSOL-HC) 2.5 % rectal cream, Place 1 Application rectally 2 (two) times daily., Disp: 30 g, Rfl: 0   Hypromellose (ARTIFICIAL TEARS OP), Apply 1 drop to eye as needed for dry eyes.,  Disp: , Rfl:    loratadine (CLARITIN) 10 MG tablet, Take 10 mg by mouth daily., Disp: , Rfl:    Multiple Vitamins-Minerals (MULTIVITAMIN PO), Take 1 tablet by mouth daily., Disp: , Rfl:    PROLIA 60 MG/ML SOSY injection, Inject 60 mg into the skin every 6 (six) months., Disp: , Rfl:    tamsulosin (FLOMAX) 0.4 MG CAPS capsule, Take 0.4 mg by mouth daily., Disp: , Rfl:    VITAMIN E PO, Take 400 Units by mouth 2 (two) times daily., Disp: , Rfl:    Review of Systems Review of Systems  Constitutional:  Negative for chills and fever.  Eyes:  Negative for blurred vision and double vision.  Respiratory:  Negative for cough and shortness of breath.   Cardiovascular:  Negative for chest pain, palpitations and leg swelling.  Gastrointestinal:  Negative for abdominal pain, constipation, diarrhea, nausea and vomiting.  Neurological:  Negative for dizziness, seizures, weakness and headaches.       Objective:    Vitals BP (!) 168/86 (BP Location: Left Arm, Patient Position: Sitting, Cuff Size: Normal)   Pulse 73   Temp 98.2 F (36.8 C)   Resp 18   Ht 5' (  1.524 m)   Wt 138 lb (62.6 kg)   SpO2 98%   BMI 26.95 kg/m    Physical Examination Physical Exam     Assessment & Plan:   Mild cognitive impairment of uncertain or unknown etiology I reviewed her records from [facility] and I reviewed her neuropsychological testing.  They felt she had mild cognitive impairment with her testing.  At [facility], they were sure what the cause of her transient loss of responsiveness was caused by.  I have asked her to continue all her meds.  If this occurs again, she is to go to [facility].    No follow-ups on file.   Crist Fat, MD

## 2023-07-27 ENCOUNTER — Ambulatory Visit (INDEPENDENT_AMBULATORY_CARE_PROVIDER_SITE_OTHER): Payer: Medicare HMO | Admitting: Psychology

## 2023-07-27 DIAGNOSIS — G3184 Mild cognitive impairment, so stated: Secondary | ICD-10-CM | POA: Diagnosis not present

## 2023-07-27 NOTE — Progress Notes (Signed)
   Neuropsychology Feedback Session [name]. [facility] [place] Department of Neurology  Reason for Referral:   Shannon Martin is a 83 y.o. right-handed Caucasian female referred by Marlowe Kays, PA-C, to characterize her current cognitive functioning and assist with diagnostic clarity and treatment planning in the context of subjective cognitive decline.   Feedback:   [name] completed a comprehensive neuropsychological evaluation on 07/20/2023. Please refer to that encounter for the full report and recommendations. Briefly, results suggested a relative weakness across semantic fluency and further performance variability across visuospatial abilities and verbal learning and memory. No cognitive domain exhibited consistent impairment. Neurologically speaking, [name]'s current pattern is nonspecific in nature and does not suggest any particular illness in a compelling fashion. Concerns were expressed surrounding underlying Lewy body disease. Indeed, Ms. Gallop does report experiences concerning for REM sleep behaviors and visual hallucinations. Some of what her daughter has observed behaviorally could also reflect fluctuations in alertness common in this illness. However, testing does not currently align in a particularly compelling manner as executive functioning was appropriate and visuospatial abilities were variable but often intact. Concerns were also expressed for Alzheimer's disease. Across memory testing, [name] did have greater difficulty learning a novel list of words relative to story content. After a brief delay, she was amnestic towards this list (i.e., 0% retention). However, she responded well to cueing with a perfect 20/20 performance across a recognition trial. Performances were likewise appropriate across story and figure-based tasks. Speaking in a very broad sense, difficulties with learning and memory, semantic fluency, and visuospatial abilities do align  fairly well with a typical Alzheimer's disease progression. However, there is not compelling evidence to suggest prominent memory impairment at the present time or memory dysfunction in a pattern which would strongly point to Alzheimer's disease as the underlying cause.  [name] was accompanied by her daughter during the current feedback session. Content of the current session focused on the results of her neuropsychological evaluation. [name] was given the opportunity to ask questions and her questions were answered. She was encouraged to reach out should additional questions arise. A copy of her report was provided at the conclusion of the visit.      One unit [phone] was billed for Dr. Tammy Sours time spent preparing for, conducting, and documenting the current feedback session with [name].

## 2023-08-06 ENCOUNTER — Encounter: Payer: Self-pay | Admitting: Family Medicine

## 2023-08-10 ENCOUNTER — Ambulatory Visit: Payer: Medicare HMO | Admitting: Physician Assistant

## 2023-08-21 ENCOUNTER — Ambulatory Visit: Payer: Medicare HMO | Admitting: Physician Assistant

## 2023-08-21 ENCOUNTER — Other Ambulatory Visit: Payer: Medicare HMO

## 2023-08-21 ENCOUNTER — Encounter: Payer: Self-pay | Admitting: Physician Assistant

## 2023-08-21 VITALS — BP 166/88 | HR 78 | Resp 18 | Wt 131.0 lb

## 2023-08-21 DIAGNOSIS — G3184 Mild cognitive impairment, so stated: Secondary | ICD-10-CM

## 2023-08-21 DIAGNOSIS — R413 Other amnesia: Secondary | ICD-10-CM

## 2023-08-21 NOTE — Progress Notes (Signed)
 Assessment/Plan:   Mild cognitive impairment of unclear etiology, concern for Alzheimer's disease  Shannon Martin is a delightful 83 y.o. 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 Patient is on donepezil 10 mg daily, tolerating well.  Memory is stable.  Discussed with her daughter drawing blood biomarkers to rule out Alzheimer's disease, as this is likely the culprit of her condition, and if negative, we could concentrate in LBD (patient reports that her RBD disorder and hallucinations have much improved).  Daughter and patient agree to proceed with the study.She still able to participate on her ADLs, no longer drives.     Recommendations:   Follow up in 6 months. Continue donepezil 10 mg daily, side effects discussed Repeat neuropsych evaluation in 18 to 24 months for diagnostic clarity and disease trajectory Will perform blood biomarkers to rule out Alzheimer disease Recommend good control of cardiovascular risk factors.  Continue Eliquis.  For more elevated blood pressure this afternoon Continue to control mood as per PCP    Subjective:   This patient is accompanied in the office by her daughter  who supplements the history. Previous records as well as any outside records available were reviewed prior to todays visit.   Patient was last seen on 05/07/2023 with [facility] 22/30    Any changes in memory since last visit? "About the same".  She continues to have difficulty remembering new information and recent conversations, names.  There is confabulation, as before.  LTM is good.  She is read "Voraciously".  She likes to play solitaire, spending a great amount of time in her room. repeats oneself?  Endorsed, especially  with appointments. Disoriented when walking into a room?  may misplace some stuff but not in unusual places Misplacing objects?  Patient denies   Wandering behavior?   denies   Any personality changes since last visit?  As recalled, she is still afraid of being alone.  Any worsening depression?: denies   Hallucinations or paranoia?  As before, she is afraid of being left alone especially at night, she checks several times the doors before going to sleep.  She does have hallucinations although not as frequent as before Seizures?   denies    Any sleep changes? Sleeps well, no further night terrors +REM behavior and denies shadowboxing and sleepwalking   Sleep apnea?   denies Any hygiene concerns?  Endorsed, she refuses to shower. Independent of bathing and dressing?  Endorsed  Does the patient needs help with medications?  Daughter is in charge Who is in charge of the finances?  Daughter is in charge Any changes in appetite?  Denies, but daughter thinks that she does not eat when no one is in the house.    Patient have trouble swallowing?  denies   Does the patient cook?  Any kitchen accidents such as leaving the stove on?   denies   Any headaches?   She has a history of migraines, but not recently. Vision changes? denies Chronic pain?  denies   Ambulates with difficulty?    Denies.    Recent falls or head injuries?    Denies.      Unilateral weakness, numbness or tingling?   Denies.   Any tremors?  denies   Any anosmia?  Endorsed, over the last 4 years.  There Any incontinence of urine?  She has a history of OAB, sees urology.  She had a recent UTI on January 2025. Any bowel dysfunction?  "Of unknown " Her daughter lives with the patient since January 2024. Does the patient drive?  She no longer drives because she was getting lost.  Neuropsych evaluation 07/20/2023 briefly, results suggested a relative weakness across semantic fluency and further performance variability across  visuospatial abilities and verbal learning and memory. No cognitive domain exhibited consistent impairment. Neurologically speaking, Shannon Martin's current pattern is nonspecific in nature and does not suggest any particular illness in a compelling fashion. Concerns were expressed surrounding underlying Lewy body disease. Indeed, Shannon Martin does report experiences concerning for REM sleep behaviors and visual hallucinations. Some of what her daughter has observed behaviorally could also reflect fluctuations in alertness common in this illness. However, testing does not currently align in a particularly compelling manner as executive functioning was appropriate and visuospatial abilities were variable but often intact. Concerns were also expressed for Alzheimer's disease. Across memory testing, Shannon Martin did have greater difficulty learning a novel list of words relative to story content. After a brief delay, she was amnestic towards this list (i.e., 0% retention). However, she responded well to cueing with a perfect 20/20 performance across a recognition trial. Performances were likewise appropriate across story and figure-based tasks. Speaking in a very broad sense, difficulties with learning and memory, semantic fluency, and visuospatial abilities do align fairly well with a typical Alzheimer's disease progression. However, there is not compelling evidence to suggest prominent memory impairment at the present time or memory dysfunction in a pattern which would strongly point to Alzheimer's disease as the underlying cause.  Initial visit 05/07/2023    How long did patient have memory difficulties?  For about 3 years.  "Little things, especially when the material not important". Patient reports more difficulty remembering new information, recent conversations, names. There has been confabulation for at least 3-4 years according to daughter. LTM is great per daughter's report. She reads voraciously, and likes  to play solitaire.  "She spends a lot of time in her room ".  repeats oneself?  Endorsed, especially with appointments  Disoriented when walking into a room?  Patient denies    Leaving objects in unusual places?  She may misplace the phone.     Wandering behavior? denies. Any personality changes, or depression, anxiety?  This is to be more pronounced, she was afraid to live alone, for which her daughter moved her with her.  She reports since then "I am doing better".   Hallucinations or paranoia? Denies although her daughter says she said "she was being chased by giant spiders (May 2024) ". There have been other incidents. She is afraid to be left alone, especially at night, she checks several times before going to sleep.  One time, she went to her daughters bedroom,, back to her daughter's close, to get back to her room, [place].  Once they came, "she did not know if those 3 people outside were alive or dead ".  They questioned the daughter because she stated that they were keeping her hostage, "a whole mess"".   Seizures? denies .   Any sleep changes?  Overall she sleeps well however occasionally she may have night terrors and when she wakes up she may feel disoriented.  Patient reports that she has been dong that since being a child.  Daughter reports that she has vivid dreams, + REM behavior which shadowboxing,  denies sleepwalking.  Sleep apnea? Denies.   Any hygiene concerns? Endorsed "since at least January, we are lucky if she does every 2 weeks" Independent of bathing and dressing? Endorsed  Does the patient need help with medications? Patient is in charge, daughter monitors.  She has a calendar to check off to make sure she does it     Who is in charge of the finances? Daughter  is in charge    Any changes in appetite?   Denies. Daughter thinks she does not eat when no one is in the house   Patient have trouble swallowing?  Denies.   Does the patient cook? No. She  moved to her daughter house and she took away the handle leaving it at the burner, the fire department came noticing it with as well as an empty teapot on the stove.   Any headaches? She has a history of migraines not recently    Chronic pain? Denies.   Ambulates with difficulty? Denies  Recent falls or head injuries? 3 years ago and hit the L forehead, no LOC.  One fall on the parking lot while walking the door 6 y ago   Vision changes?  Denies Any strokelike symptoms? Denies.   Any tremors? Denies  Any anosmia? Endorsed over the last 3 years  Any incontinence of urine? She has a history of OAB " sees urology.   Any bowel dysfunction?" Off and on".     Patient lives with her daughter since Jan 2024     History of heavy alcohol intake? Denies.   History of heavy tobacco use? Denies.   Family history of dementia?  Father had AD. PGM dementia ?type.  Does patient drive?   No longer drives. Daughter hid the car keys because she was getting lost and not driving well.  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   GERD (gastroesophageal reflux disease)    Hip fracture 06/30/2022   IBS (irritable bowel syndrome) 03/22/2022   Iliotibial band syndrome of left side 06/17/2015   Internal hemorrhoid, bleeding 05/24/2022   Laryngopharyngeal reflux (LPR) 03/24/2015   Left knee pain 10/01/2014   Migraine without aura and without status migrainosus, not intractable 07/28/2019   Mild cognitive impairment of uncertain or unknown etiology 07/20/2023   Mixed hyperlipidemia 12/11/2018   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   Overactive bladder    Takes Vesicare   PAF (paroxysmal atrial fibrillation) 05/04/2021    Primary osteoarthritis of left knee 12/03/2014   Proctitis 02/15/2022   S/p left hip fracture 07/29/2022   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   Seborrheic dermatitis 12/21/2020   Stage 3a chronic kidney disease 06/08/2023   Urinary retention with incomplete bladder emptying 06/30/2022   Urinary tract infection without hematuria 07/09/2023     Past Surgical History:  Procedure Laterality Date   BREAST BIOPSY Left ~ 1973   CATARACT EXTRACTION W/ INTRAOCULAR LENS  IMPLANT, BILATERAL Bilateral 2003   CHEST TUBE INSERTION  January 2015   X 2 at [facility]   DILATION AND CURETTAGE OF UTERUS  4-5   JOINT REPLACEMENT     REPLACEMENT TOTAL KNEE Left    TONSILLECTOMY AND ADENOIDECTOMY  1946; 1947   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   Procedure: TOTAL HIP ARTHROPLASTY;  Surgeon: Joen Laura, MD;  Location: [facility] OR;  Service: Orthopedics;  Laterality: Left;   TOTAL KNEE ARTHROPLASTY Left 01/11/2015   TOTAL KNEE ARTHROPLASTY Left 01/11/2015   Procedure: Left TOTAL KNEE ARTHROPLASTY;  Surgeon: Dannielle Huh, MD;  Location: [facility] OR;  Service: Orthopedics;  Laterality: Left;   VIDEO ASSISTED THORACOSCOPY (VATS)/THOROCOTOMY Left 07/13/2013   VATS with insertion of chest tubes     PREVIOUS MEDICATIONS:   CURRENT MEDICATIONS:  Outpatient Encounter Medications as of 08/21/2023  Medication Sig   apixaban (ELIQUIS) 5 MG TABS tablet Take 1 tablet (5 mg total) by mouth 2 (two) times daily.   atorvastatin (LIPITOR) 40 MG tablet Take 1 tablet (40 mg total) by mouth daily.   budesonide-formoterol (SYMBICORT) 80-4.5 MCG/ACT inhaler INHALE 2 PUFFS INTO LUNGS TWICE DAILY, IN THE MORNING AND EVENING   donepezil (ARICEPT) 10 MG tablet Take 1 tablet (10 mg total) by mouth at bedtime.   estradiol (ESTRACE) 0.1 MG/GM vaginal cream Place 1 Applicatorful vaginally at bedtime.   fluticasone (FLONASE) 50 MCG/ACT nasal spray Can use one spray in each nostril once daily as directed.   hydrocortisone  (ANUSOL-HC) 2.5 % rectal cream Place 1 Application rectally 2 (two) times daily.   loratadine (CLARITIN) 10 MG tablet Take 10 mg by mouth daily.   Menaquinone-7 (VITAMIN K2 PO) Take by mouth.   Multiple Vitamins-Minerals (MULTIVITAMIN PO) Take 1 tablet by mouth daily.   PROLIA 60 MG/ML SOSY injection Inject 60 mg into the skin every 6 (six) months.   tamsulosin (FLOMAX) 0.4 MG CAPS capsule Take 0.4 mg by mouth daily.   B Complex-C-Calcium (B-COMPLEX/VITAMIN C, W/ CA, PO) Take 1 capsule by mouth daily.   famotidine (PEPCID) 40 MG tablet TAKE 1 TABLET(40 MG) BY MOUTH TWICE DAILY   Hypromellose (ARTIFICIAL TEARS OP) Apply 1 drop to eye as needed for dry eyes.   VITAMIN E PO Take 400 Units by mouth 2 (two) times daily.   No facility-administered encounter medications on file as of 08/21/2023.     Objective:     PHYSICAL EXAMINATION:    VITALS:   Vitals:   08/21/23 1504  BP: (!) 166/88  Pulse: 78  Resp: 18  SpO2: 97%  Weight: 131 lb (59.4 kg)    GEN:  The patient appears stated age and is in NAD. HEENT:  Normocephalic, atraumatic.   Neurological examination:  General: NAD, well-groomed, appears stated age. Orientation: The patient is alert. Oriented to person, place and date Cranial nerves: There is good facial symmetry.The speech is fluent and clear. No aphasia or dysarthria. Fund of knowledge is appropriate. Recent memory impaired and remote memory is normal.  Attention and concentration are reduced.  Able to name objects and repeat phrases.  Hearing is intact to conversational tone. Sensation: Sensation is intact to light touch throughout Motor: Strength is at least antigravity x4. DTR's 2/4 in UE/LE      05/07/2023   11:00 AM  Montreal Cognitive Assessment   Visuospatial/ Executive (0/5) 3  Naming (0/3) 3  Attention: Read list of digits (0/2) 1  Attention: Read list of letters (0/1) 1  Attention: Serial 7 subtraction starting at 100 (0/3) 3  Language: Repeat phrase  (0/2) 2  Language : Fluency (0/1) 1  Abstraction (0/2) 0  Delayed Recall (0/5) 1  Orientation (0/6) 6  Total 21  Adjusted Score (based on education) 22  03/05/2023    5:08 PM 07/25/2022   11:58 AM 09/16/2021    9:29 AM  MMSE - Mini Mental State Exam  Orientation to time 5 5 4   Orientation to Place 5 5 5   Registration 2 3 3   Attention/ Calculation 5 5 5   Recall 3 3 3   Language- name 2 objects 2 2 2   Language- repeat 1 1 1   Language- follow 3 step command 3 3 3   Language- read & follow direction 1 1 1   Write a sentence 1 1 1   Copy design 1 1 1   Total score 29 30 29        Movement examination: Tone: There is normal tone in the UE/LE Abnormal movements:  no tremor.  No myoclonus.  No asterixis.   Coordination:  There is no decremation with RAM's. Normal finger to nose  Gait and Station: The patient has no difficulty arising out of a deep-seated chair without the use of the hands. The patient's stride length is good.  Gait is cautious and narrow.   Thank you for allowing [place] the opportunity to participate in the care of this nice patient. Please do not hesitate to contact us for any questions or concerns.   Total time spent on today's visit was 24 minutes dedicated to this patient today, preparing to see patient, examining the patient, ordering tests and/or medications and counseling the patient, documenting clinical information in the EHR or other health record, independently interpreting results and communicating results to the patient/family, discussing treatment and goals, answering patient's questions and coordinating care.  Cc:  Crist Fat, MD  Marlowe Kays 08/21/2023 5:13 PM

## 2023-08-21 NOTE — Patient Instructions (Signed)
Labs today [address]

## 2023-08-28 LAB — QUEST AD-DETECT PHOSPHORYLATED TAU217(P-TAU217), PLASMA: Quest Detect PTAU217, Plasma: 0.83 pg/mL — ABNORMAL HIGH (ref <=0.15)

## 2023-08-28 LAB — QUEST AD-DETECT® PHOSPHORYLATED TAU181(P-TAU181), PLASMA: QUEST AD DETECT PTAU181, PLASMA: 2.69 pg/mL — ABNORMAL HIGH (ref <=1.07)

## 2023-08-28 LAB — QUEST AD-DETECT™, BETA-AMYLOID 42/40 RATIO, PLASMA
ABETA 40: 339 pg/mL
ABETA 42/40 RATIO: 0.147 — ABNORMAL LOW (ref >=0.170)
ABETA 42: 50 pg/mL

## 2023-08-28 LAB — QUEST AD-DETECT APOLIPOPROTEIN E (APOE) ISOFORM, PLASMA

## 2023-09-02 ENCOUNTER — Other Ambulatory Visit: Payer: Self-pay | Admitting: Family Medicine

## 2023-09-03 ENCOUNTER — Other Ambulatory Visit: Payer: Self-pay

## 2023-09-03 ENCOUNTER — Ambulatory Visit: Payer: Medicare HMO | Admitting: Internal Medicine

## 2023-09-03 DIAGNOSIS — J3089 Other allergic rhinitis: Secondary | ICD-10-CM

## 2023-09-03 MED ORDER — FLUTICASONE PROPIONATE 50 MCG/ACT NA SUSP: NASAL | 1 refills | Status: DC

## 2023-09-07 ENCOUNTER — Ambulatory Visit: Payer: Medicare HMO | Admitting: Internal Medicine

## 2023-09-07 ENCOUNTER — Encounter: Payer: Self-pay | Admitting: Internal Medicine

## 2023-09-07 VITALS — BP 130/78 | HR 80 | Temp 98.6°F | Resp 18 | Ht 61.0 in | Wt 131.8 lb

## 2023-09-07 DIAGNOSIS — F0280 Dementia in other diseases classified elsewhere without behavioral disturbance: Secondary | ICD-10-CM | POA: Insufficient documentation

## 2023-09-07 DIAGNOSIS — N3281 Overactive bladder: Secondary | ICD-10-CM

## 2023-09-07 DIAGNOSIS — N390 Urinary tract infection, site not specified: Secondary | ICD-10-CM

## 2023-09-07 DIAGNOSIS — G301 Alzheimer's disease with late onset: Secondary | ICD-10-CM

## 2023-09-07 LAB — POCT URINALYSIS DIPSTICK
Bilirubin, UA: NEGATIVE
Blood, UA: NEGATIVE
Glucose, UA: NEGATIVE
Nitrite, UA: POSITIVE
Protein, UA: POSITIVE — AB
Spec Grav, UA: 1.02 (ref 1.010–1.025)
Urobilinogen, UA: 0.2 U/dL
pH, UA: 5.5 (ref 5.0–8.0)

## 2023-09-07 MED ORDER — MIRABEGRON ER 25 MG PO TB24: 25.0000 mg | ORAL_TABLET | Freq: Every day | ORAL | 2 refills | Status: DC

## 2023-09-07 MED ORDER — MEMANTINE HCL 10 MG PO TABS: 10.0000 mg | ORAL_TABLET | Freq: Two times a day (BID) | ORAL | 1 refills | Status: DC

## 2023-09-07 MED ORDER — MEMANTINE HCL 5 MG PO TABS: 5.0000 mg | ORAL_TABLET | Freq: Two times a day (BID) | ORAL | 0 refills | Status: DC

## 2023-09-07 MED ORDER — CIPROFLOXACIN HCL 500 MG PO TABS: 500.0000 mg | ORAL_TABLET | Freq: Two times a day (BID) | ORAL | 0 refills | Status: AC

## 2023-09-07 NOTE — Progress Notes (Unsigned)
 Office Visit  Subjective   Patient ID: Shannon Martin   DOB: 06-Jun-1941   Age: 83 y.o.   MRN: [id number]   Chief Complaint No chief complaint on file.    History of Present Illness Shannon Martin is a 83 yo female who comes in today for followup of her memory loss.  Again, she was admitted to [facility] from 07/11/2023 until 07/12/2023.  At that time, she was sitting on her couch and became unresponsive and difficult to wake.  In the ER, her workup was unremarkable with labs and EKG normal.  A CT of the head was negative.  Teleneurology was consulted and recommended a MRI of the brain and EEG.  A MRI of the head was done on 07/11/2023 which showed bilateral senescent small vessel ischemic changes.  Her EEG done on the same day showed no clear epileptiform activity but there was slow transients noted and slight focal neurophysiological disturbance in her the temporal regions bilaterally.  An ECHO was done on 07/11/2023 showed a LVEF 55-60% and she had impairment of her LV diastolic function.  There was trace aortic regurgitation.  She has a history of underlying cognitive dysfunction and the hospitalists were not sure what the etiology of her unresponsive episode.  She does have a neuropsychology testing done which was done last week on 07/20/2023.  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 Her testing showed a pattern of performance suggestive of a relative weakness across semantic fluency and further performance variability across visuospatial abilities and verbal learning and memory. No cognitive domain exhibited consistent impairment. Performances were appropriate relative to age-matched peers across processing speed, attention/concentration, executive functioning, receptive language, phonemic fluency, confrontation naming, and visual memory. Functionally, Shannon Martin no longer drives and her family has taken over medication management, financial management, and bill  paying responsibilities. Despite concerns surrounding functional impairment, Shannon Martin exhibits a relatively benign cognitive profile, certainly not suggestive of dementia level impairment. 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  They did biomarks with Tau and betamyloid testing and these were indicative of alzheimer's dementia.  They recommended to follow up in 6 months and to continue donepezil 10mg  daily and repeat another neuropsychiatric evaluation in 18-24 months.  They also recommend to control mood.  Her daughter first noted that the patient began having some mild memory problems about 2 years ago.  They noticed that she had problems remembering how to drive to certain places.  She did have an accidental fall in 06/2022 where she had a left femoral hip fracture where she was hospitalized and had a THA done at that time.  The patient was sent to [facility] for short term rehab and they noted that after this event her memory was worsened.  The patient was independently living by herself before then but after [facility] she went to live with her daughter as she could not be left alone.  She became confused during a vacation in 09/2022 where she woke up and did not know where she was at and called 911.  There has been some sundowning where if she falls asleep and  wakes up later at night it takes time to get her bearings or she wakes up and says something nonsensical.  She has problems with short term memories but not long term memories.  She still recognizes everyone but cannot put the right name with the right person.  The daughter states  that Shannon Martin keeps to herself and reads on her own.  There has been no behavioral problems and there may be some anxiety but they are not sure about depression.  There is no crying episodes.  The patient states she gets up 3-4 times a night to get up to use the bathroom.  She was assessed by her previous primary care physician in 07/2022 where they did blood work and a MMSE.  Her labs were unremarkable and her daughter states she did well with her MMSE.  They did a MRI of her brain on 08/07/2022 showed age congruent brain MRI. No specific or reversible cause for symptoms.   She otherwise does her other ADL's.    The patient comes in today also stating that she is having worsening urinary symptoms where she has a history of overactive bladder where she has seen Dr. Saddie Benders in the past.  She is complaining of not completely emptying her bladder fully and gets urgency to urinate.  She is getting up 5-6 times per night to urinate.  She does not leak if she coughs or sneezes.  She denies any dysuria, hematuria, nausea, vomiting, abdominal pain, fevers, chills or other problems.     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   GERD (gastroesophageal reflux disease)    Hip fracture 06/30/2022   IBS (irritable bowel syndrome) 03/22/2022   Iliotibial band syndrome of left side 06/17/2015   Internal hemorrhoid, bleeding 05/24/2022   Laryngopharyngeal reflux (LPR) 03/24/2015   Left knee pain 10/01/2014   Migraine without aura and without status migrainosus, not intractable 07/28/2019   Mild cognitive impairment of uncertain or unknown etiology 07/20/2023   Mixed hyperlipidemia 12/11/2018   Moderate  persistent asthma 03/24/2015   Overactive bladder    Takes Vesicare   PAF (paroxysmal atrial fibrillation) 05/04/2021   Primary osteoarthritis of left knee 12/03/2014   Proctitis 02/15/2022   S/p left hip fracture 07/29/2022   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   Seborrheic dermatitis 12/21/2020   Stage 3a chronic kidney disease 06/08/2023   Urinary retention with incomplete bladder emptying 06/30/2022   Urinary tract infection without hematuria 07/09/2023     Allergies Allergies  Allergen Reactions   Tetanus Toxoids Swelling and Other (See Comments)    Swelling around throat and jaws   Bee Venom Swelling and Other (See Comments)    Crusty area on skin   Hornet Venom Swelling    Crusty area on skin   Dilaudid [Hydromorphone] Nausea And Vomiting    Causes nausea and vomiting   Erythromycin Nausea And Vomiting   Fiorinal [Butalbital-Aspirin-Caffeine]     Altered mental status   Amoxicillin Other (See Comments)    dizzy   Doxycycline Other (See Comments)    dizzy   Latex Other (See Comments)    Skin will crack open and bleed   Neomycin Other (See Comments)    Irritates skin  Penicillins Itching, Swelling and Rash     Medications  Current Outpatient Medications:    apixaban (ELIQUIS) 5 MG TABS tablet, Take 1 tablet (5 mg total) by mouth 2 (two) times daily., Disp: 180 tablet, Rfl: 1   atorvastatin (LIPITOR) 40 MG tablet, Take 1 tablet (40 mg total) by mouth daily., Disp: 90 tablet, Rfl: 3   B Complex-C-Calcium (B-COMPLEX/VITAMIN C, W/ CA, PO), Take 1 capsule by mouth daily., Disp: , Rfl:    budesonide-formoterol (SYMBICORT) 80-4.5 MCG/ACT inhaler, INHALE 2 PUFFS INTO LUNGS TWICE DAILY, IN THE MORNING AND EVENING, Disp: 30.6 g, Rfl: 6   donepezil (ARICEPT) 10 MG tablet, Take 1 tablet (10 mg total) by mouth at bedtime., Disp: 30 tablet, Rfl: 5   estradiol (ESTRACE) 0.1 MG/GM vaginal cream, Place 1 Applicatorful vaginally at bedtime., Disp: , Rfl:    famotidine (PEPCID)  40 MG tablet, TAKE 1 TABLET(40 MG) BY MOUTH TWICE DAILY, Disp: 180 tablet, Rfl: 0   fluticasone (FLONASE) 50 MCG/ACT nasal spray, Can use one spray in each nostril once daily as directed., Disp: 48 g, Rfl: 1   hydrocortisone (ANUSOL-HC) 2.5 % rectal cream, Place 1 Application rectally 2 (two) times daily., Disp: 30 g, Rfl: 0   Hypromellose (ARTIFICIAL TEARS OP), Apply 1 drop to eye as needed for dry eyes., Disp: , Rfl:    loratadine (CLARITIN) 10 MG tablet, Take 10 mg by mouth daily., Disp: , Rfl:    Menaquinone-7 (VITAMIN K2 PO), Take by mouth., Disp: , Rfl:    Multiple Vitamins-Minerals (MULTIVITAMIN PO), Take 1 tablet by mouth daily., Disp: , Rfl:    PROLIA 60 MG/ML SOSY injection, Inject 60 mg into the skin every 6 (six) months., Disp: , Rfl:    tamsulosin (FLOMAX) 0.4 MG CAPS capsule, Take 0.4 mg by mouth daily., Disp: , Rfl:    VITAMIN E PO, Take 400 Units by mouth 2 (two) times daily., Disp: , Rfl:    Review of Systems Review of Systems  Constitutional:  Negative for chills and fever.  Eyes:  Positive for blurred vision. Negative for double vision.  Respiratory:  Negative for shortness of breath.   Cardiovascular:  Negative for chest pain, palpitations and leg swelling.  Gastrointestinal:  Positive for constipation. Negative for abdominal pain, diarrhea, nausea and vomiting.  Genitourinary:  Positive for frequency and urgency. Negative for dysuria and hematuria.  Musculoskeletal:  Negative for myalgias.  Skin:  Negative for rash.  Neurological:  Negative for dizziness, weakness and headaches.       Objective:    Vitals BP 130/78   Pulse 80   Temp 98.6 F (37 C)   Resp 18   Ht 5\' 1"  (1.549 m)   Wt 131 lb 12.8 oz (59.8 kg)   SpO2 97%   BMI 24.90 kg/m    Physical Examination Physical Exam Constitutional:      Appearance: Normal appearance. She is not ill-appearing.  Cardiovascular:     Rate and Rhythm: Normal rate and regular rhythm.     Pulses: Normal pulses.      Heart sounds: No murmur heard.    No friction rub. No gallop.  Pulmonary:     Effort: Pulmonary effort is normal. No respiratory distress.     Breath sounds: No wheezing, rhonchi or rales.  Abdominal:     General: Bowel sounds are normal. There is no distension.     Palpations: Abdomen is soft.     Tenderness: There is no abdominal tenderness.  Musculoskeletal:  Right lower leg: No edema.     Left lower leg: No edema.  Skin:    General: Skin is warm and dry.     Findings: No rash.  Neurological:     General: No focal deficit present.     Mental Status: She is alert and oriented to person, place, and time.  Psychiatric:        Mood and Affect: Mood normal.        Behavior: Behavior normal.        Assessment & Plan:   Overactive bladder I am going to start here on myrbetriq for OAB.  Urinary tract infection without hematuria I would begin her on cipro 500mg  BID x 5 days.  We will send her for a urine culture.  DAT (dementia of Alzheimer type) (HCC) She has mild dementia with behavioral problems with sundowning.  I did a MMSE today and she scored a 30/30 but again on her neuropsychiatric testing, she has cognitive issues.  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  She remains on aricept 10mg  daily and I am going to add namenda 5mg  BID and go up to 10mg  BID after a month if she tolerates this.  I had a discussion about the prognosis of dementia.  Her daughter was asking about resources and I talked to her about the senior center.  They may ultimately have her placed in an assisted living setting.      Return in about 3 months (around 12/08/2023).   Crist Fat, MD

## 2023-09-07 NOTE — Assessment & Plan Note (Signed)
 I would begin her on cipro 500mg  BID x 5 days.  We will send her for a urine culture.

## 2023-09-07 NOTE — Assessment & Plan Note (Signed)
 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  I did a MMSE today and she scored a 30/30 but again on her neuropsychiatric testing, she has cognitive issues.  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  She remains on aricept 10mg  daily and I am going to add namenda 5mg  BID and go up to 10mg  BID after a month if she tolerates this.  I had a discussion about the prognosis of dementia.  Her daughter was asking about resources and I talked to her about the senior center.  They may ultimately have her placed in an assisted living setting.

## 2023-09-07 NOTE — Assessment & Plan Note (Signed)
 I am going to start here on myrbetriq for OAB.

## 2023-09-11 LAB — URINE CULTURE

## 2023-09-23 ENCOUNTER — Other Ambulatory Visit: Payer: Self-pay | Admitting: Internal Medicine

## 2023-10-01 ENCOUNTER — Other Ambulatory Visit: Payer: Self-pay | Admitting: Family Medicine

## 2023-10-01 ENCOUNTER — Other Ambulatory Visit: Payer: Self-pay | Admitting: Internal Medicine

## 2023-10-01 ENCOUNTER — Other Ambulatory Visit: Payer: Self-pay

## 2023-10-01 DIAGNOSIS — E782 Mixed hyperlipidemia: Secondary | ICD-10-CM

## 2023-10-01 MED ORDER — ATORVASTATIN CALCIUM 40 MG PO TABS: 40.0000 mg | ORAL_TABLET | Freq: Every day | ORAL | 3 refills | Status: AC

## 2023-10-12 ENCOUNTER — Other Ambulatory Visit: Payer: Self-pay | Admitting: Internal Medicine

## 2023-11-05 ENCOUNTER — Other Ambulatory Visit: Payer: Self-pay | Admitting: Cardiology

## 2023-11-05 DIAGNOSIS — I480 Paroxysmal atrial fibrillation: Secondary | ICD-10-CM

## 2023-11-05 NOTE — Telephone Encounter (Signed)
 Prescription refill request for Eliquis  received. Indication: PAF Last office visit: 03/09/23  R Revankar MD Scr: 1.00 on 06/08/23  Epic Age: 83 Weight: 61.9kg  Based on above findings Eliquis  5mg  twice daily is the appropriate dose.  Refill approved.

## 2023-11-22 ENCOUNTER — Other Ambulatory Visit: Payer: Self-pay | Admitting: Internal Medicine

## 2023-11-27 ENCOUNTER — Other Ambulatory Visit: Payer: Self-pay | Admitting: Internal Medicine

## 2023-12-05 ENCOUNTER — Encounter: Payer: Self-pay | Admitting: Internal Medicine

## 2023-12-05 ENCOUNTER — Ambulatory Visit: Admitting: Internal Medicine

## 2023-12-05 VITALS — BP 118/78 | HR 74 | Temp 98.1°F | Resp 18 | Ht 60.0 in | Wt 130.6 lb

## 2023-12-05 DIAGNOSIS — N390 Urinary tract infection, site not specified: Secondary | ICD-10-CM | POA: Diagnosis not present

## 2023-12-05 DIAGNOSIS — N1831 Chronic kidney disease, stage 3a: Secondary | ICD-10-CM

## 2023-12-05 DIAGNOSIS — R350 Frequency of micturition: Secondary | ICD-10-CM | POA: Diagnosis not present

## 2023-12-05 DIAGNOSIS — I480 Paroxysmal atrial fibrillation: Secondary | ICD-10-CM

## 2023-12-05 DIAGNOSIS — M810 Age-related osteoporosis without current pathological fracture: Secondary | ICD-10-CM | POA: Diagnosis not present

## 2023-12-05 DIAGNOSIS — F02A18 Dementia in other diseases classified elsewhere, mild, with other behavioral disturbance: Secondary | ICD-10-CM

## 2023-12-05 DIAGNOSIS — G301 Alzheimer's disease with late onset: Secondary | ICD-10-CM | POA: Diagnosis not present

## 2023-12-05 LAB — POCT URINALYSIS DIPSTICK
Bilirubin, UA: NEGATIVE
Blood, UA: NEGATIVE
Glucose, UA: NEGATIVE
Ketones, UA: NEGATIVE
Nitrite, UA: POSITIVE
Protein, UA: NEGATIVE
Spec Grav, UA: 1.01 (ref 1.010–1.025)
Urobilinogen, UA: 0.2 U/dL
pH, UA: 7 (ref 5.0–8.0)

## 2023-12-05 MED ORDER — SERTRALINE HCL 25 MG PO TABS: 25.0000 mg | ORAL_TABLET | Freq: Every day | ORAL | 2 refills | Status: DC

## 2023-12-05 MED ORDER — CIPROFLOXACIN HCL 500 MG PO TABS: 500.0000 mg | ORAL_TABLET | Freq: Two times a day (BID) | ORAL | 0 refills | Status: AC

## 2023-12-05 NOTE — Progress Notes (Signed)
 Office Visit  Subjective   Patient ID: Shannon Martin   DOB: 04/04/1941   Age: 83 y.o.   MRN: [id number]   Chief Complaint Chief Complaint  Patient presents with   Follow-up    89M F/U     History of Present Illness The patient returns today with complaints of urinary symptoms with increased frequency.  She does have a history of overactive bladder where she has seen Dr. Reginal Capra in the past.   On her last visit, we started her on mybetriq which she states did help but again about 9 days ago she started having urinary frequency.  With her OAB, she has feelings of not completely emptying her bladder fully and gets urgency to urinate.  She is now getting up once at night instead of 5-6 times per night to urinate.  She does not leak if she coughs or sneezes.  She denies any dysuria, hematuria, nausea, vomiting, abdominal pain, fevers, chills or other problems  The patient also has Stage IIIa CKD which on looking back on her labs, she has had since 2016.  Her baseline creatinine ranges 0.9-1.1 and her GFR runs 48-65.  The patient denies any NSAIDS.  Shannon Martin is a 83 yo female who comes in today for followup of her memory loss.  Again, she was admitted to [facility] from 07/11/2023 until 07/12/2023.  At that time, she was sitting on her couch and became unresponsive and difficult to wake.  In the ER, her workup was unremarkable with labs and EKG normal.  A CT of the head was negative.  Teleneurology was consulted and recommended a MRI of the brain and EEG.  A MRI of the head was done on 07/11/2023 which showed bilateral senescent small vessel ischemic changes.  Her EEG done on the same day showed no clear epileptiform activity but there was slow transients noted and slight focal neurophysiological disturbance in her the temporal regions bilaterally.  An ECHO was done on 07/11/2023 showed a LVEF 55-60% and she had impairment of her LV diastolic function.  There was trace aortic regurgitation.  She has a history of  underlying cognitive dysfunction and the hospitalists were not sure what the etiology of her unresponsive episode.  She does have a neuropsychology testing done which was done last week on 07/20/2023.  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 Her testing showed a pattern of performance suggestive of a relative weakness across semantic fluency and further performance variability across visuospatial abilities and verbal learning and memory. No cognitive domain exhibited consistent impairment. Performances were appropriate relative to age-matched peers across processing speed, attention/concentration, executive functioning, receptive language, phonemic fluency, confrontation naming, and visual memory. Functionally, Shannon Martin no longer drives and her family has taken over medication management, financial management, and bill paying responsibilities. Despite concerns surrounding functional impairment, Ms. Canal exhibits a relatively benign cognitive profile, certainly not suggestive of dementia level impairment. 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  They did biomarks with Tau and betamyloid testing and these were indicative of alzheimer's dementia.  They recommended to follow up in 6 months and to continue donepezil  10mg  daily and  repeat another neuropsychiatric evaluation in 18-24 months.  They also recommend to control mood.  Her daughter first noted that the patient began having some mild memory problems about 2 years ago.  They noticed that she had problems remembering how to drive to certain places.  She did  have an accidental fall in 06/2022 where she had a left femoral hip fracture where she was hospitalized and had a THA done at that time.  The patient was sent to [facility] for short term rehab and they noted that after this event her memory was worsened.  The patient was independently living by herself before then but after [facility] she went to live with her daughter as she could not be left alone.  She became confused during a vacation in 09/2022 where she woke up and did not know where she was at and called 911.  There has been some sundowning where if she falls asleep and wakes up later at night it takes time to get her bearings or she wakes up and says something nonsensical.  She has problems with short term memories but not long term memories.  She still recognizes everyone but cannot put the right name with the right person.  The daughter states that [name] keeps to herself and reads on her own.  There has been no behavioral problems and there may be some anxiety but they are not sure about depression.  There is no crying episodes.  The patient states she gets up 3-4 times a night to get up to use the bathroom.  She was assessed by her previous primary care physician in 07/2022 where they did blood work and a MMSE.  Her labs were unremarkable and her daughter states she did well with her MMSE.  They did a MRI of her brain on 08/07/2022 showed age congruent brain MRI. No specific or reversible cause for symptoms.   She otherwise does her other ADL's.  I saw her 3 months ago where we repeated her MMSE and she scored a 30/30 but again on her neuropsychiatric testing, she has cognitive issues.  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  She remains on aricept  10mg  daily and uptitrated her dose of namenda  to 10mg  BID and she is not having any side effects from this medication.   Her daughter notices some subtle difference but it sounds like she  has been stable over the last 3 months.  This patient has a history of atrial fibrillation that was diagnosed in 2020.  She is currently been seen by Dr. Harlan Liberty in cardiology.  She was using a cardia mobile but has not been doing this recently.  She is not on any rate controller meds but she is on eliquis  2.5mg  BID.  Her atrial fibrillation is controlled with therapy as summarized in the medication list and previous notes. Specifically denied complaints: chest pain, SOB, palpitations, generalized weakness, syncope or TIAs. She had an ECHO done on 04/20/2021 which showed a normal systolic function with a LVEF of 60-65%.  There was no LV regional wall motion abnormalities but she did have Grade I diastolic dysfunction.  There were no problems with her valves.  She initially presented with her A. Fib where she was having syncopal episodes.  Today, she denies any chest pain, palpitations, SOB, generalized weakness, syncope or other problems.       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   GERD (gastroesophageal reflux disease)    Hip fracture 06/30/2022   IBS (irritable bowel syndrome) 03/22/2022   Iliotibial band syndrome of left side 06/17/2015   Internal hemorrhoid, bleeding 05/24/2022   Laryngopharyngeal reflux (LPR) 03/24/2015   Left knee pain 10/01/2014   Migraine without aura and without status migrainosus, not intractable 07/28/2019   Mild cognitive impairment of uncertain or unknown etiology 07/20/2023   Mixed hyperlipidemia 12/11/2018   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   Overactive bladder    Takes Vesicare   PAF (paroxysmal atrial fibrillation) 05/04/2021   Primary  osteoarthritis of left knee 12/03/2014   Proctitis 02/15/2022   S/p left hip fracture 07/29/2022   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   Seborrheic dermatitis 12/21/2020   Stage 3a chronic kidney disease 06/08/2023   Urinary retention with incomplete bladder emptying 06/30/2022   Urinary tract infection without hematuria 07/09/2023     Allergies Allergies  Allergen Reactions   Tetanus Toxoids Swelling and Other (See Comments)    Swelling around throat and jaws   Bee Venom Swelling and Other (See Comments)    Crusty area on skin   Hornet Venom Swelling    Crusty area on skin   Dilaudid  [Hydromorphone ] Nausea And Vomiting    Causes nausea and vomiting   Erythromycin Nausea And Vomiting   Fiorinal [Butalbital-Aspirin -Caffeine]     Altered mental status   Amoxicillin Other (See Comments)    dizzy   Doxycycline Other (See Comments)    dizzy   Latex Other (See Comments)    Skin will crack open and bleed   Neomycin Other (See Comments)    Irritates skin   Penicillins Itching, Swelling and Rash     Medications  Current Outpatient Medications:    atorvastatin  (LIPITOR) 40 MG tablet, Take 1 tablet (40 mg total) by mouth daily., Disp: 90 tablet, Rfl: 3   B Complex-C-Calcium  (B-COMPLEX/VITAMIN C, W/ CA, PO), Take 1 capsule by mouth daily., Disp: , Rfl:    donepezil  (ARICEPT ) 10 MG tablet, Take 1 tablet (10 mg total) by mouth at bedtime., Disp: 30 tablet, Rfl: 5   ELIQUIS  5 MG TABS tablet, TAKE 1 TABLET(5 MG) BY MOUTH TWICE DAILY, Disp: 180 tablet, Rfl: 1   famotidine  (PEPCID ) 40 MG tablet, TAKE 1 TABLET(40 MG) BY MOUTH TWICE DAILY, Disp: 180 tablet, Rfl: 0   Hypromellose (ARTIFICIAL TEARS OP), Apply 1 drop to eye as needed for dry eyes., Disp: , Rfl:    loratadine  (CLARITIN ) 10 MG tablet, Take 10 mg by mouth daily., Disp: , Rfl:    memantine  (NAMENDA ) 10 MG tablet, TAKE 1 TABLET(10 MG) BY MOUTH TWICE DAILY, Disp: 60 tablet, Rfl: 1   Menaquinone-7 (VITAMIN K2 PO), Take by mouth.,  Disp: , Rfl:    Multiple Vitamins-Minerals (MULTIVITAMIN PO), Take 1 tablet by mouth daily., Disp: , Rfl:    MYRBETRIQ  25 MG TB24 tablet, TAKE 1 TABLET(25 MG) BY MOUTH DAILY, Disp: 30 tablet, Rfl: 2   PROLIA 60 MG/ML SOSY injection, Inject 60 mg into the skin every 6 (six) months., Disp: , Rfl:    tamsulosin  (FLOMAX ) 0.4 MG CAPS capsule, Take 0.4 mg by mouth daily., Disp: , Rfl:    Review of Systems Review of Systems  Constitutional:  Negative  for chills and fever.  Eyes:  Negative for blurred vision and double vision.  Respiratory:  Negative for cough and shortness of breath.   Cardiovascular:  Negative for chest pain, palpitations and leg swelling.  Gastrointestinal:  Negative for abdominal pain, blood in stool, constipation, diarrhea, melena, nausea and vomiting.  Genitourinary:  Positive for frequency. Negative for hematuria.  Musculoskeletal:  Negative for myalgias.  Skin:  Negative for itching and rash.  Neurological:  Negative for dizziness, weakness and headaches.  Endo/Heme/Allergies:  Negative for polydipsia.       Objective:    Vitals BP 118/78   Pulse 74   Temp 98.1 F (36.7 C) (Oral)   Resp 18   Ht 5' (1.524 m)   Wt 130 lb 9.6 oz (59.2 kg)   SpO2 98%   BMI 25.51 kg/m    Physical Examination Physical Exam Constitutional:      Appearance: Normal appearance. She is not ill-appearing.  Cardiovascular:     Rate and Rhythm: Normal rate and regular rhythm.     Pulses: Normal pulses.     Heart sounds: No murmur heard.    No friction rub. No gallop.  Pulmonary:     Effort: Pulmonary effort is normal. No respiratory distress.     Breath sounds: No wheezing, rhonchi or rales.  Abdominal:     General: Bowel sounds are normal. There is no distension.     Palpations: Abdomen is soft.     Tenderness: There is no abdominal tenderness.  Musculoskeletal:     Right lower leg: No edema.     Left lower leg: No edema.  Skin:    General: Skin is warm and dry.      Findings: No rash.  Neurological:     General: No focal deficit present.     Mental Status: She is alert and oriented to person, place, and time.  Psychiatric:        Mood and Affect: Mood normal.        Behavior: Behavior normal.        Assessment & Plan:   PAF (paroxysmal atrial fibrillation) She is currently controlled.  She remains on eliquis  at this time.  She is not on any rate control medications.     DAT (dementia of Alzheimer type) [facility]) It seems her dementia is stable.  We will continue on namenda  and aricpet.  She has noticed that her mom is more withdrawn.  I am going to start her on sertraline 25mg  daily.  Stage 3a chronic kidney disease We will continue to monitor her BP and weight.  She is to avoid NSAIDS.  Urinary tract infection without hematuria She has a UTI by her UA.  We will send for culture.  I am going to put her on cipro .    Return in about 3 months (around 03/06/2024).   Wayne Haines, MD

## 2023-12-05 NOTE — Assessment & Plan Note (Signed)
 She is currently controlled.  She remains on eliquis  at this time.  She is not on any rate control medications.

## 2023-12-05 NOTE — Addendum Note (Signed)
 Addended by: Adelfa Adolph on: 12/05/2023 05:25 PM   Modules accepted: Orders

## 2023-12-05 NOTE — Assessment & Plan Note (Signed)
 It seems her dementia is stable.  We will continue on namenda  and aricpet.  She has noticed that her mom is more withdrawn.  I am going to start her on sertraline 25mg  daily.

## 2023-12-05 NOTE — Assessment & Plan Note (Signed)
 She has a UTI by her UA.  We will send for culture.  I am going to put her on cipro .

## 2023-12-05 NOTE — Assessment & Plan Note (Signed)
 We will continue to monitor her BP and weight.  She is to avoid NSAIDS.

## 2023-12-06 ENCOUNTER — Ambulatory Visit: Attending: Cardiology | Admitting: Cardiology

## 2023-12-06 ENCOUNTER — Encounter: Payer: Self-pay | Admitting: Cardiology

## 2023-12-06 ENCOUNTER — Ambulatory Visit: Attending: Cardiology

## 2023-12-06 ENCOUNTER — Encounter: Payer: Self-pay | Admitting: *Deleted

## 2023-12-06 VITALS — BP 140/66 | HR 66 | Ht 60.0 in | Wt 130.0 lb

## 2023-12-06 DIAGNOSIS — I480 Paroxysmal atrial fibrillation: Secondary | ICD-10-CM | POA: Diagnosis not present

## 2023-12-06 DIAGNOSIS — E782 Mixed hyperlipidemia: Secondary | ICD-10-CM | POA: Diagnosis not present

## 2023-12-06 DIAGNOSIS — R002 Palpitations: Secondary | ICD-10-CM

## 2023-12-06 DIAGNOSIS — R42 Dizziness and giddiness: Secondary | ICD-10-CM | POA: Insufficient documentation

## 2023-12-06 LAB — CMP14 + ANION GAP
ALT: 18 IU/L (ref 0–32)
AST: 22 IU/L (ref 0–40)
Albumin: 4.2 g/dL (ref 3.7–4.7)
Alkaline Phosphatase: 72 IU/L (ref 44–121)
Anion Gap: 15 mmol/L (ref 10.0–18.0)
BUN/Creatinine Ratio: 20 (ref 12–28)
BUN: 19 mg/dL (ref 8–27)
Bilirubin Total: 0.3 mg/dL (ref 0.0–1.2)
CO2: 20 mmol/L (ref 20–29)
Calcium: 9.5 mg/dL (ref 8.7–10.3)
Chloride: 103 mmol/L (ref 96–106)
Creatinine, Ser: 0.95 mg/dL (ref 0.57–1.00)
Globulin, Total: 2 g/dL (ref 1.5–4.5)
Glucose: 91 mg/dL (ref 70–99)
Potassium: 4.7 mmol/L (ref 3.5–5.2)
Sodium: 138 mmol/L (ref 134–144)
Total Protein: 6.2 g/dL (ref 6.0–8.5)
eGFR: 60 mL/min/{1.73_m2} (ref >59)

## 2023-12-06 LAB — CBC WITH DIFFERENTIAL/PLATELET
Basophils Absolute: 0.1 10*3/uL (ref 0.0–0.2)
Basos: 1 %
EOS (ABSOLUTE): 0.2 10*3/uL (ref 0.0–0.4)
Eos: 3 %
Hematocrit: 40.1 % (ref 34.0–46.6)
Hemoglobin: 12.9 g/dL (ref 11.1–15.9)
Immature Grans (Abs): 0 10*3/uL (ref 0.0–0.1)
Immature Granulocytes: 0 %
Lymphocytes Absolute: 2.1 10*3/uL (ref 0.7–3.1)
Lymphs: 27 %
MCH: 30.9 pg (ref 26.6–33.0)
MCHC: 32.2 g/dL (ref 31.5–35.7)
MCV: 96 fL (ref 79–97)
Monocytes Absolute: 0.7 10*3/uL (ref 0.1–0.9)
Monocytes: 9 %
Neutrophils Absolute: 4.7 10*3/uL (ref 1.4–7.0)
Neutrophils: 60 %
Platelets: 290 10*3/uL (ref 150–450)
RBC: 4.17 x10E6/uL (ref 3.77–5.28)
RDW: 12.4 % (ref 11.7–15.4)
WBC: 7.7 10*3/uL (ref 3.4–10.8)

## 2023-12-06 LAB — VITAMIN D 25 HYDROXY (VIT D DEFICIENCY, FRACTURES): Vit D, 25-Hydroxy: 84.1 ng/mL (ref 30.0–100.0)

## 2023-12-06 NOTE — Patient Instructions (Signed)
 Medication Instructions:  Your physician recommends that you continue on your current medications as directed. Please refer to the Current Medication list given to you today.  *If you need a refill on your cardiac medications before your next appointment, please call your pharmacy*   Lab Work: None ordered If you have labs (blood work) drawn today and your tests are completely normal, you will receive your results only by: MyChart Message (if you have MyChart) OR A paper copy in the mail If you have any lab test that is abnormal or we need to change your treatment, we will call you to review the results.   Testing/Procedures: A zio monitor was ordered today. It will remain on for 14 days. Remove 12/20/23. You will then return monitor and event diary in provided box. It takes 1-2 weeks for report to be downloaded and returned to us . We will call you with the results. If monitor falls off or has orange flashing light, please call Zio for further instructions.    Follow-Up: At [facility], you and your health needs are our priority.  As part of our continuing mission to provide you with exceptional heart care, we have created designated Provider Care Teams.  These Care Teams include your primary Cardiologist (physician) and Advanced Practice Providers (APPs -  Physician Assistants and Nurse Practitioners) who all work together to provide you with the care you need, when you need it.  We recommend signing up for the patient portal called MyChart.  Sign up information is provided on this After Visit Summary.  MyChart is used to connect with patients for Virtual Visits (Telemedicine).  Patients are able to view lab/test results, encounter notes, upcoming appointments, etc.  Non-urgent messages can be sent to your provider as well.   To learn more about what you can do with MyChart, go to [email or web address].    Your next appointment:   9 month(s)  The format for your next  appointment:   In Person  Provider:   Hillis Lu, MD    Other Instructions none  Important Information About Sugar

## 2023-12-06 NOTE — Progress Notes (Signed)
 Cardiology Office Note:    Date:  12/06/2023   ID:  Shannon Martin, DOB Sep 22, 1940, MRN [id number]  PCP:  Wayne Haines, MD  Cardiologist:  Nelia Balzarine, MD   Referring MD: Wayne Haines, MD    ASSESSMENT:    1. PAF (paroxysmal atrial fibrillation)   2. Mixed hyperlipidemia   3. Palpitations   4. Dizziness    PLAN:    In order of problems listed above:  Primary prevention stressed with the patient.  Importance of compliance with diet medication stressed and patient verbalized standing. Mixed dyslipidemia: On lipid-lowering medications followed by primary care.  Diet emphasized. Paroxysmal atrial fibrillation:I discussed with the patient atrial fibrillation, disease process. Management and therapy including rate and rhythm control, anticoagulation benefits and potential risks were discussed extensively with the patient. Patient had multiple questions which were answered to patient's satisfaction. Dizziness and palpitations: Unclear etiology.  Will do a 2-week monitor.  I also told her to keep track of blood pressures when she has symptoms.  She agrees to do so.  She will get them back to us  in 2 weeks. Patient will be seen in follow-up appointment in 9 months or earlier if the patient has any concerns.    Medication Adjustments/Labs and Tests Ordered: Current medicines are reviewed at length with the patient today.  Concerns regarding medicines are outlined above.  No orders of the defined types were placed in this encounter.  No orders of the defined types were placed in this encounter.    Chief Complaint  Patient presents with   Follow-up     History of Present Illness:    Shannon Martin is a 83 y.o. female.  Patient has past medical history of mixed dyslipidemia and paroxysmal atrial fibrillation.  She denies any problems at this time and takes care of activities of daily living.  No chest pain orthopnea or PND.  Her granddaughter accompanies her to visit.  She  tells me that she feels dizzy at times especially in the morning.  She is not on any medications to lower her blood pressure or heart rate.  No chest pain orthopnea PND no syncope.  She has not had a fall.  At the time of my evaluation, the patient is alert awake oriented and in no distress.  She rarely has palpitations also.  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   GERD (gastroesophageal reflux disease)    Hip fracture 06/30/2022   IBS (irritable bowel syndrome) 03/22/2022   Iliotibial band syndrome of left side 06/17/2015   Internal hemorrhoid, bleeding 05/24/2022   Laryngopharyngeal reflux (LPR) 03/24/2015   Left knee pain 10/01/2014   Migraine without aura and without status migrainosus, not intractable 07/28/2019   Mild cognitive impairment of uncertain or unknown etiology 07/20/2023   Mixed hyperlipidemia 12/11/2018   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   Overactive bladder    Takes Vesicare   PAF (paroxysmal atrial fibrillation) 05/04/2021   Primary osteoarthritis of left knee 12/03/2014   Proctitis 02/15/2022   S/p left hip fracture 07/29/2022   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   Seborrheic dermatitis 12/21/2020   Stage 3a chronic kidney disease 06/08/2023   Urinary retention with incomplete bladder emptying 06/30/2022   Urinary tract  infection without hematuria 07/09/2023    Past Surgical History:  Procedure Laterality Date   BREAST BIOPSY Left ~ 1973   CATARACT EXTRACTION W/ INTRAOCULAR LENS  IMPLANT, BILATERAL Bilateral 2003   CHEST TUBE INSERTION  January 2015   X 2 at [facility]   DILATION AND CURETTAGE OF UTERUS  4-5   JOINT REPLACEMENT     REPLACEMENT TOTAL  KNEE Left    TONSILLECTOMY AND ADENOIDECTOMY  1946; 1947   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   Procedure: TOTAL HIP ARTHROPLASTY;  Surgeon: Murleen Arms, MD;  Location: [facility] OR;  Service: Orthopedics;  Laterality: Left;   TOTAL KNEE ARTHROPLASTY Left 01/11/2015   TOTAL KNEE ARTHROPLASTY Left 01/11/2015   Procedure: Left TOTAL KNEE ARTHROPLASTY;  Surgeon: Christie Cox, MD;  Location: [facility] OR;  Service: Orthopedics;  Laterality: Left;   VIDEO ASSISTED THORACOSCOPY (VATS)/THOROCOTOMY Left 07/13/2013   VATS with insertion of chest tubes    Current Medications: Current Meds  Medication Sig   atorvastatin  (LIPITOR) 40 MG tablet Take 1 tablet (40 mg total) by mouth daily.   B Complex-C-Calcium  (B-COMPLEX/VITAMIN C, W/ CA, PO) Take 1 capsule by mouth daily.   ciprofloxacin  (CIPRO ) 500 MG tablet Take 1 tablet (500 mg total) by mouth 2 (two) times daily for 7 days.   donepezil  (ARICEPT ) 10 MG tablet Take 1 tablet (10 mg total) by mouth at bedtime.   ELIQUIS  5 MG TABS tablet TAKE 1 TABLET(5 MG) BY MOUTH TWICE DAILY   famotidine  (PEPCID ) 40 MG tablet TAKE 1 TABLET(40 MG) BY MOUTH TWICE DAILY   Hypromellose (ARTIFICIAL TEARS OP) Apply 1 drop to eye as needed for dry eyes.   loratadine  (CLARITIN ) 10 MG tablet Take 10 mg by mouth daily.   memantine  (NAMENDA ) 10 MG tablet TAKE 1 TABLET(10 MG) BY MOUTH TWICE DAILY   Menaquinone-7 (VITAMIN K2 PO) Take by mouth.   Multiple Vitamins-Minerals (MULTIVITAMIN PO) Take 1 tablet by mouth daily.   MYRBETRIQ  25 MG TB24 tablet TAKE 1 TABLET(25 MG) BY MOUTH DAILY   PROLIA 60 MG/ML SOSY injection Inject 60 mg into the skin every 6 (six) months.   sertraline (ZOLOFT) 25 MG tablet Take 1 tablet (25 mg total) by mouth daily.   tamsulosin  (FLOMAX ) 0.4 MG CAPS capsule Take 0.4 mg by mouth daily.     Allergies:   Tetanus toxoids, Bee venom, Hornet venom, Dilaudid  [hydromorphone ], Erythromycin, Fiorinal [butalbital-aspirin -caffeine], Amoxicillin, Doxycycline, Latex,  Neomycin, and Penicillins   Social History   Socioeconomic History   Marital status: Widowed    Spouse name: Not on file   Number of children: 1   Years of education: 12   Highest education level: High school graduate  Occupational History   Occupation: retired  Tobacco Use   Smoking status: Never   Smokeless tobacco: Never  Vaping Use   Vaping status: Never Used  Substance and Sexual Activity   Alcohol  use: Yes    Comment: infrequently   Drug use: No   Sexual activity: Not Currently  Other Topics Concern   Not on file  Social History Narrative   Daughter lives nearby, granddaughter lives in [place] - married 3 times, all deceased.  Has not been in contact with her sister in >20 years   Social Drivers of Health   Financial Resource Strain: Low Risk  (03/14/2022)   Overall Financial Resource Strain (CARDIA)    Difficulty of Paying Living Expenses: Not hard at all  Food Insecurity: No Food Insecurity (10/16/2022)  Hunger Vital Sign    Worried About Running Out of Food in the Last Year: Never true    Ran Out of Food in the Last Year: Never true  Transportation Needs: No Transportation Needs (10/16/2022)   PRAPARE - Administrator, Civil Service (Medical): No    Lack of Transportation (Non-Medical): No  Physical Activity: Insufficiently Active (10/16/2022)   Exercise Vital Sign    Days of Exercise per Week: 2 days    Minutes of Exercise per Session: 30 min  Stress: No Stress Concern Present (10/16/2022)   [employer] of Occupational Health - Occupational Stress Questionnaire    Feeling of Stress : Only a little  Social Connections: Unknown (10/11/2021)   Social Connection and Isolation Panel    Frequency of Communication with Friends and Family: More than three times a week    Frequency of Social Gatherings with Friends and Family: More than three times a week    Attends Religious Services: Not on file    Active Member of Clubs or Organizations: No     Attends Banker Meetings: Never    Marital Status: Widowed     Family History: The patient's family history includes Diabetes in her father; Hypertension in her maternal grandfather, maternal grandmother, and mother.  ROS:   Please see the history of present illness.    All other systems reviewed and are negative.  EKGs/Labs/Other Studies Reviewed:    The following studies were reviewed today: I discussed my findings with the patient at length.   Recent Labs: 12/05/2023: ALT 18; BUN 19; Creatinine, Ser 0.95; Hemoglobin 12.9; Platelets 290; Potassium 4.7; Sodium 138  Recent Lipid Panel    Component Value Date/Time   CHOL 174 10/02/2022 0908   TRIG 54 10/02/2022 0908   HDL 86 10/02/2022 0908   CHOLHDL 2.0 10/02/2022 0908   LDLCALC 77 10/02/2022 0908    Physical Exam:    VS:  BP (!) 140/66   Pulse 66   Ht 5' (1.524 m)   Wt 130 lb (59 kg)   SpO2 97%   BMI 25.39 kg/m     Wt Readings from Last 3 Encounters:  12/06/23 130 lb (59 kg)  12/05/23 130 lb 9.6 oz (59.2 kg)  09/07/23 131 lb 12.8 oz (59.8 kg)     GEN: Patient is in no acute distress HEENT: Normal NECK: No JVD; No carotid bruits LYMPHATICS: No lymphadenopathy CARDIAC: Hear sounds regular, 2/6 systolic murmur at the apex. RESPIRATORY:  Clear to auscultation without rales, wheezing or rhonchi  ABDOMEN: Soft, non-tender, non-distended MUSCULOSKELETAL:  No edema; No deformity  SKIN: Warm and dry NEUROLOGIC:  Alert and oriented x 3 PSYCHIATRIC:  Normal affect   Signed, Nelia Balzarine, MD  12/06/2023 8:30 AM    [facility]

## 2023-12-13 ENCOUNTER — Ambulatory Visit: Payer: Self-pay

## 2023-12-13 NOTE — Progress Notes (Signed)
 Patient called.  Patient aware.  I have called and informed the patients daughter  Her labs look good. .  Pts daughter aware.

## 2023-12-20 DIAGNOSIS — M810 Age-related osteoporosis without current pathological fracture: Secondary | ICD-10-CM | POA: Diagnosis not present

## 2023-12-21 ENCOUNTER — Other Ambulatory Visit: Payer: Self-pay | Admitting: Internal Medicine

## 2023-12-27 ENCOUNTER — Ambulatory Visit: Payer: Self-pay | Admitting: Cardiology

## 2023-12-27 DIAGNOSIS — R002 Palpitations: Secondary | ICD-10-CM | POA: Diagnosis not present

## 2023-12-27 DIAGNOSIS — I480 Paroxysmal atrial fibrillation: Secondary | ICD-10-CM | POA: Diagnosis not present

## 2024-01-01 DIAGNOSIS — H43393 Other vitreous opacities, bilateral: Secondary | ICD-10-CM | POA: Diagnosis not present

## 2024-01-04 ENCOUNTER — Ambulatory Visit: Admitting: Physician Assistant

## 2024-01-04 ENCOUNTER — Encounter: Payer: Self-pay | Admitting: Physician Assistant

## 2024-01-04 VITALS — BP 137/73 | HR 69 | Resp 20 | Ht 61.0 in | Wt 128.0 lb

## 2024-01-04 DIAGNOSIS — G3184 Mild cognitive impairment, so stated: Secondary | ICD-10-CM | POA: Diagnosis not present

## 2024-01-04 NOTE — Patient Instructions (Addendum)
 It was a pleasure to see you today at our office.   Recommendations:   Follow up in 6 months  Continue donepezil  10 mg daily. Side effects were discussed   Recommend visiting the website :  Dementia Success Path to better understand some behaviors related to memory loss.     https://www.barrowneuro.org/resource/neuro-rehabilitation-apps-and-games/   RECOMMENDATIONS FOR ALL PATIENTS WITH MEMORY PROBLEMS: 1. Continue to exercise (Recommend 30 minutes of walking everyday, or 3 hours every week) 2. Increase social interactions - continue going to [place] and enjoy social gatherings with friends and family 3. Eat healthy, avoid fried foods and eat more fruits and vegetables 4. Maintain adequate blood pressure, blood sugar, and blood cholesterol level. Reducing the risk of stroke and cardiovascular disease also helps promoting better memory. 5. Avoid stressful situations. Live a simple life and avoid aggravations. Organize your time and prepare for the next day in anticipation. 6. Sleep well, avoid any interruptions of sleep and avoid any distractions in the bedroom that may interfere with adequate sleep quality 7. Avoid sugar, avoid sweets as there is a strong link between excessive sugar intake, diabetes, and cognitive impairment We discussed the Mediterranean diet, which has been shown to help patients reduce the risk of progressive memory disorders and reduces cardiovascular risk. This includes eating fish, eat fruits and green leafy vegetables, nuts like almonds and hazelnuts, walnuts, and also use olive oil. Avoid fast foods and fried foods as much as possible. Avoid sweets and sugar as sugar use has been linked to worsening of memory function.  There is always a concern of gradual progression of memory problems. If this is the case, then we may need to adjust level of care according to patient needs. Support, both to the patient and caregiver, should then be put into place.      FALL  PRECAUTIONS: Be cautious when walking. Scan the area for obstacles that may increase the risk of trips and falls. When getting up in the mornings, sit up at the edge of the bed for a few minutes before getting out of bed. Consider elevating the bed at the head end to avoid drop of blood pressure when getting up. Walk always in a well-lit room (use night lights in the walls). Avoid area rugs or power cords from appliances in the middle of the walkways. Use a walker or a cane if necessary and consider physical therapy for balance exercise. Get your eyesight checked regularly.  FINANCIAL OVERSIGHT: Supervision, especially oversight when making financial decisions or transactions is also recommended.  HOME SAFETY: Consider the safety of the kitchen when operating appliances like stoves, microwave oven, and blender. Consider having supervision and share cooking responsibilities until no longer able to participate in those. Accidents with firearms and other hazards in the house should be identified and addressed as well.   ABILITY TO BE LEFT ALONE: If patient is unable to contact 911 operator, consider using LifeLine, or when the need is there, arrange for someone to stay with patients. Smoking is a fire hazard, consider supervision or cessation. Risk of wandering should be assessed by caregiver and if detected at any point, supervision and safe proof recommendations should be instituted.  MEDICATION SUPERVISION: Inability to self-administer medication needs to be constantly addressed. Implement a mechanism to ensure safe administration of the medications.      Mediterranean Diet A Mediterranean diet refers to food and lifestyle choices that are based on the traditions of countries located on the [employer]. This way of  eating has been shown to help prevent certain conditions and improve outcomes for people who have chronic diseases, like kidney disease and heart disease. What are tips for  following this plan? Lifestyle  Cook and eat meals together with your family, when possible. Drink enough fluid to keep your urine clear or pale yellow. Be physically active every day. This includes: Aerobic exercise like running or swimming. Leisure activities like gardening, walking, or housework. Get 7-8 hours of sleep each night. If recommended by your health care provider, drink red wine in moderation. This means 1 glass a day for nonpregnant women and 2 glasses a day for men. A glass of wine equals 5 oz (150 mL). Reading food labels  Check the serving size of packaged foods. For foods such as rice and pasta, the serving size refers to the amount of cooked product, not dry. Check the total fat in packaged foods. Avoid foods that have saturated fat or trans fats. Check the ingredients list for added sugars, such as corn syrup. Shopping  At the grocery store, buy most of your food from the areas near the walls of the store. This includes: Fresh fruits and vegetables (produce). Grains, beans, nuts, and seeds. Some of these may be available in unpackaged forms or large amounts (in bulk). Fresh seafood. Poultry and eggs. Low-fat dairy products. Buy whole ingredients instead of prepackaged foods. Buy fresh fruits and vegetables in-season from local farmers markets. Buy frozen fruits and vegetables in resealable bags. If you do not have access to quality fresh seafood, buy precooked frozen shrimp or canned fish, such as tuna, salmon, or sardines. Buy small amounts of raw or cooked vegetables, salads, or olives from the deli or salad bar at your store. Stock your pantry so you always have certain foods on hand, such as olive oil, canned tuna, canned tomatoes, rice, pasta, and beans. Cooking  Cook foods with extra-virgin olive oil instead of using butter or other vegetable oils. Have meat as a side dish, and have vegetables or grains as your main dish. This means having meat in small portions  or adding small amounts of meat to foods like pasta or stew. Use beans or vegetables instead of meat in common dishes like chili or lasagna. Experiment with different cooking methods. Try roasting or broiling vegetables instead of steaming or sauteing them. Add frozen vegetables to soups, stews, pasta, or rice. Add nuts or seeds for added healthy fat at each meal. You can add these to yogurt, salads, or vegetable dishes. Marinate fish or vegetables using olive oil, lemon juice, garlic, and fresh herbs. Meal planning  Plan to eat 1 vegetarian meal one day each week. Try to work up to 2 vegetarian meals, if possible. Eat seafood 2 or more times a week. Have healthy snacks readily available, such as: Vegetable sticks with hummus. Greek yogurt. Fruit and nut trail mix. Eat balanced meals throughout the week. This includes: Fruit: 2-3 servings a day Vegetables: 4-5 servings a day Low-fat dairy: 2 servings a day Fish, poultry, or lean meat: 1 serving a day Beans and legumes: 2 or more servings a week Nuts and seeds: 1-2 servings a day Whole grains: 6-8 servings a day Extra-virgin olive oil: 3-4 servings a day Limit red meat and sweets to only a few servings a month What are my food choices? Mediterranean diet Recommended Grains: Whole-grain pasta. Brown rice. Bulgar wheat. Polenta. Couscous. Whole-wheat bread. [place] Made[place]. Vegetables: Artichokes. Beets. Broccoli. Cabbage. Carrots. Eggplant. Green beans. Chard. Kale.  Spinach. Onions. Leeks. Peas. Squash. Tomatoes. Peppers. Radishes. Fruits: Apples. Apricots. Avocado. Berries. Bananas. Cherries. Dates. Figs. Grapes. Lemons. Melon. Oranges. Peaches. Plums. Pomegranate. Meats and other protein foods: Beans. Almonds. Sunflower seeds. Pine nuts. Peanuts. Cod. Salmon. Scallops. Shrimp. Tuna. Tilapia. Clams. Oysters. Eggs. Dairy: Low-fat milk. Cheese. Greek yogurt. Beverages: Water . Red wine. Herbal tea. Fats and oils: Extra virgin olive oil.  Avocado oil. Grape seed oil. Sweets and desserts: [place] yogurt with honey. Baked apples. Poached pears. Trail mix. Seasoning and other foods: Basil. Cilantro. Coriander. Cumin. Mint. Parsley. Sage. Rosemary. Tarragon. Garlic. Oregano. Thyme. Pepper. Balsalmic vinegar. Tahini. Hummus. Tomato sauce. Olives. Mushrooms. Limit these Grains: Prepackaged pasta or rice dishes. Prepackaged cereal with added sugar. Vegetables: Deep fried potatoes (french fries). Fruits: Fruit canned in syrup. Meats and other protein foods: Beef. Pork. Lamb. Poultry with skin. Hot dogs. [place]. Dairy: Ice cream. Sour cream. Whole milk. Beverages: Juice. Sugar-sweetened soft drinks. Beer. Liquor and spirits. Fats and oils: Butter. Canola oil. Vegetable oil. Beef fat (tallow). Lard. Sweets and desserts: Cookies. Cakes. Pies. Candy. Seasoning and other foods: Mayonnaise. Premade sauces and marinades. The items listed may not be a complete list. Talk with your dietitian about what dietary choices are right for you. Summary The Mediterranean diet includes both food and lifestyle choices. Eat a variety of fresh fruits and vegetables, beans, nuts, seeds, and whole grains. Limit the amount of red meat and sweets that you eat. Talk with your health care provider about whether it is safe for you to drink red wine in moderation. This means 1 glass a day for nonpregnant women and 2 glasses a day for men. A glass of wine equals 5 oz (150 mL). This information is not intended to replace advice given to you by your health care provider. Make sure you discuss any questions you have with your health care provider. Document Released: 02/03/2016 Document Revised: 03/07/2016 Document Reviewed: 02/03/2016 Elsevier Interactive Patient Education  2017 [employer].

## 2024-01-04 NOTE — Progress Notes (Signed)
 Assessment/Plan:    Mild cognitive impairment of unclear etiology, concern for Alzheimer's disease  Shannon Martin is a delightful 83 y.o. RH female with a history of hypertension, hyperlipidemia, PAF on AC and a diagnosis of mild cognitive impairment of unclear etiology with concerns for Alzheimer's disease presenting today in follow-up for evaluation of memory loss. Patient is on donepezil  10 mg daily.,  Memory is stable, with MMSE today 30/30.  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  Patient is able to participate on her ADLs.  No longer drives.  Mood is stable     Recommendations:   Follow up in 6 months. Continue donepezil  10 mg daily, side effects discussed Repeat neuropsych evaluation in 12 to 18 months for diagnostic clarity and disease trajectory Recommend good control of cardiovascular risk factors continue Eliquis  Continue to control mood as per PCP    Subjective:   This patient is accompanied in the office by her daughter who supplements the history. Previous records as well as any outside records available were reviewed prior to todays visit. Patient was last seen on 08/21/2023, last MMSE on 03/05/2023 was 29/30.    Any changes in memory since last visit? About the same.  Continues to have mild difficulty with short-term memory, especially new information, recent conversations and names.  There is confabulation, as before she tells stories that are not true .  Long-term memory is fair.  She likes to read voraciously, likes to go to [employer] with her daughter.  She likes to play solitaire, spending a great amount of time in her room. repeats oneself?  Endorsed, especially with appointments Disoriented when walking into a room?  Patient denies.    Misplacing objects?  Patient denies   Wandering behavior?   Denies. Any personality changes since last visit?   As before, she is concerned of being alone especially at night she checks several times at doors before going to sleep maybe a little less then before.  Denies hallucinations.  Any worsening depression?: denies.   Seizures?   Denies.    Any sleep changes? Sleeps well, without night terrors. She denies any REM behavior, shadowboxing or sleepwalking.  Sleep apnea?   denies    Any hygiene concerns?  Endorsed, she refuses to shower Independent of bathing and dressing?  Endorsed  Does the patient needs help with medications?  Daughter is in charge   Who is in charge of the finances?  Daughter is in charge     Any changes in appetite?  Denies. although her daughter thinks that she may not be eating when no one is in the house    Patient have trouble swallowing?  Denies.   Does the patient cook? No  Any headaches?    History of migraines, but not recently Vision changes? Denies. Chronic pain?  Denies.   Ambulates with difficulty?  Denies.    Recent falls or head injuries? Denies.      Unilateral weakness, numbness or tingling?  Denies.   Any tremors?  Denies.   Any anosmia?  Endorsed, for the last 5 years Any incontinence of urine?  She has a history of OAB, sees urology.  She has recurrent UTIs, last 2 weeks ago Any bowel dysfunction?  Denies.      Daughter lives with her.  Does the patient drive?  No longer drives after becoming lost  Neuropsych evaluation 07/20/2023 briefly, results suggested a relative weakness across semantic fluency and further performance variability across visuospatial abilities and verbal learning and memory. No cognitive domain exhibited consistent impairment. Neurologically speaking, Ms. Rady's current pattern is nonspecific in nature and does not suggest any particular illness in a compelling fashion. Concerns were expressed surrounding underlying Lewy body disease. Indeed, Ms. Schmutz does report experiences concerning for REM sleep behaviors and visual  hallucinations. Some of what her daughter has observed behaviorally could also reflect fluctuations in alertness common in this illness. However, testing does not currently align in a particularly compelling manner as executive functioning was appropriate and visuospatial abilities were variable but often intact. Concerns were also expressed for Alzheimer's disease. Across memory testing, Ms. Radu did have greater difficulty learning a novel list of words relative to story content. After a brief delay, she was amnestic towards this list (i.e., 0% retention). However, she responded well to cueing with a perfect 20/20 performance across a recognition trial. Performances were likewise appropriate across story and figure-based tasks. Speaking in a very broad sense, difficulties with learning and memory, semantic fluency, and visuospatial abilities do align fairly well with a typical Alzheimer's disease progression. However, there is not compelling evidence to suggest prominent memory impairment at the present time or memory dysfunction in a pattern which would strongly point to Alzheimer's disease as the underlying cause.   Initial visit 05/07/2023    How long did patient have memory difficulties?  For about 3 years.  Little things, especially when the material not important. Patient reports more difficulty remembering new information, recent conversations, names. There has been confabulation for at least 3-4 years according to daughter. LTM is great per daughter's report. She reads voraciously, and likes to play solitaire.  She spends a lot of time in her room .  repeats oneself?  Endorsed, especially with appointments  Disoriented when walking into a room?  Patient denies    Leaving objects in unusual places?  She may misplace the phone.     Wandering behavior? denies. Any personality changes, or depression, anxiety?  This is to be more pronounced, she was afraid to live alone, for which her daughter  moved her with her.  She reports since then I am doing better.   Hallucinations or paranoia? Denies although her daughter says she said she was being chased by giant spiders (May 2024) . There have been other incidents. She is afraid to be left alone, especially at night, she checks several times before going to sleep.  One time, she went to her daughters bedroom,, back to her daughter's close, to get back to her room, [place].  Once they came, she did not know if those 3 people outside were alive or dead .  They questioned the daughter because she stated that they were keeping her hostage, a whole mess.   Seizures? denies .   Any sleep changes?  Overall she sleeps well however occasionally she may have night terrors and when she wakes up she may feel disoriented.  Patient reports that she has been dong that since being a child.  Daughter reports that she has vivid dreams, + REM behavior which shadowboxing,  denies sleepwalking.  Sleep apnea? Denies.   Any hygiene concerns? Endorsed since at least January, we are lucky if she does every 2 weeks Independent of bathing and dressing? Endorsed  Does the patient need help with medications? Patient is in charge, daughter monitors.  She  has a calendar to check off to make sure she does it     Who is in charge of the finances? Daughter  is in charge    Any changes in appetite?   Denies. Daughter thinks she does not eat when no one is in the house   Patient have trouble swallowing?  Denies.   Does the patient cook? No. She moved to her daughter house and she took away the handle leaving it at the burner, the fire department came noticing it with as well as an empty teapot on the stove.   Any headaches? She has a history of migraines not recently    Chronic pain? Denies.   Ambulates with difficulty? Denies  Recent falls or head injuries? 3 years ago and hit the L forehead, no LOC.  One fall on the parking lot while walking the  door 6 y ago   Vision changes?  Denies Any strokelike symptoms? Denies.   Any tremors? Denies  Any anosmia? Endorsed over the last 3 years  Any incontinence of urine? She has a history of OAB  sees urology.   Any bowel dysfunction? Off and on.     Patient lives with her daughter since Jan 2024     History of heavy alcohol  intake? Denies.   History of heavy tobacco use? Denies.   Family history of dementia?  Father had AD. PGM dementia ?type.  Does patient drive?   No longer drives. Daughter hid the car keys because she was getting lost and not driving well.   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   GERD (gastroesophageal reflux disease)    Hip fracture 06/30/2022   IBS (irritable bowel syndrome) 03/22/2022   Iliotibial band syndrome of left side 06/17/2015   Internal hemorrhoid, bleeding 05/24/2022   Laryngopharyngeal reflux (LPR) 03/24/2015   Left knee pain 10/01/2014   Migraine without aura and without status migrainosus, not intractable 07/28/2019   Mild cognitive impairment of uncertain or unknown etiology 07/20/2023   Mixed hyperlipidemia 12/11/2018   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   Overactive bladder    Takes Vesicare   PAF (paroxysmal atrial fibrillation) 05/04/2021   Primary osteoarthritis of left knee 12/03/2014   Proctitis 02/15/2022   S/p left hip fracture 07/29/2022   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   Seborrheic dermatitis 12/21/2020   Stage 3a chronic kidney disease 06/08/2023   Urinary retention with incomplete bladder emptying 06/30/2022   Urinary tract infection without hematuria 07/09/2023     Past Surgical History:  Procedure Laterality Date    BREAST BIOPSY Left ~ 1973   CATARACT EXTRACTION W/ INTRAOCULAR LENS  IMPLANT, BILATERAL Bilateral 2003   CHEST TUBE INSERTION  January 2015   X 2 at [facility]   DILATION AND CURETTAGE OF UTERUS  4-5   JOINT REPLACEMENT     REPLACEMENT TOTAL KNEE Left    TONSILLECTOMY AND ADENOIDECTOMY  1946; 1947   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   Procedure: TOTAL HIP ARTHROPLASTY;  Surgeon: Edna Toribio LABOR, MD;  Location: [facility] OR;  Service: Orthopedics;  Laterality: Left;   TOTAL KNEE ARTHROPLASTY Left 01/11/2015   TOTAL KNEE ARTHROPLASTY Left 01/11/2015   Procedure: Left TOTAL KNEE ARTHROPLASTY;  Surgeon: Marcey Raman, MD;  Location: [facility] OR;  Service: Orthopedics;  Laterality: Left;   VIDEO ASSISTED THORACOSCOPY (VATS)/THOROCOTOMY Left 07/13/2013   VATS with insertion of chest tubes     PREVIOUS MEDICATIONS:   CURRENT MEDICATIONS:  Outpatient Encounter Medications as of 01/04/2024  Medication Sig   atorvastatin  (LIPITOR) 40 MG tablet Take 1 tablet (40 mg total) by mouth daily.   B Complex-C-Calcium  (B-COMPLEX/VITAMIN C, W/ CA, PO) Take 1 capsule by mouth daily.   donepezil  (ARICEPT ) 10 MG tablet TAKE 1 TABLET(10 MG) BY MOUTH AT BEDTIME   ELIQUIS  5 MG TABS tablet TAKE 1 TABLET(5 MG) BY MOUTH TWICE DAILY   famotidine  (PEPCID ) 40 MG tablet TAKE 1 TABLET(40 MG) BY MOUTH TWICE DAILY   Hypromellose (ARTIFICIAL TEARS OP) Apply 1 drop to eye as needed for dry eyes.   loratadine  (CLARITIN ) 10 MG tablet Take 10 mg by mouth daily.   memantine  (NAMENDA ) 10 MG tablet TAKE 1 TABLET(10 MG) BY MOUTH TWICE DAILY   Menaquinone-7 (VITAMIN K2 PO) Take by mouth.   Multiple Vitamins-Minerals (MULTIVITAMIN PO) Take 1 tablet by mouth daily.   MYRBETRIQ  25 MG TB24 tablet TAKE 1 TABLET(25 MG) BY MOUTH DAILY   PROLIA 60 MG/ML SOSY injection Inject 60 mg into the skin every 6 (six) months.   sertraline  (ZOLOFT ) 25 MG tablet Take 1 tablet (25 mg total) by mouth daily.   tamsulosin  (FLOMAX ) 0.4 MG CAPS capsule Take  0.4 mg by mouth daily.   No facility-administered encounter medications on file as of 01/04/2024.     Objective:     PHYSICAL EXAMINATION:    VITALS:   Vitals:   01/04/24 1517  BP: 137/73  Pulse: 69  Resp: 20  SpO2: 98%  Weight: 128 lb (58.1 kg)  Height: 5' 1 (1.549 m)    GEN:  The patient appears stated age and is in NAD. HEENT:  Normocephalic, atraumatic.   Neurological examination:  General: NAD, well-groomed, appears stated age. Orientation: The patient is alert. Oriented to person, place and date Cranial nerves: There is good facial symmetry.The speech is fluent and clear. No aphasia or dysarthria. Fund of knowledge is appropriate. Recent memory impaired and remote memory is normal.  Attention and concentration are normal.  Able to name objects and repeat phrases.  Hearing is intact to conversational tone Presta.   Delayed recall 3/3 Sensation: Sensation is intact to light touch throughout Motor: Strength is at least antigravity x4. DTR's 2/4 in UE/LE      05/07/2023   11:00 AM  Montreal Cognitive Assessment   Visuospatial/ Executive (0/5) 3  Naming (0/3) 3  Attention: Read list of digits (0/2) 1  Attention: Read list of letters (0/1) 1  Attention: Serial 7 subtraction starting at 100 (0/3) 3  Language: Repeat phrase (0/2) 2  Language : Fluency (0/1) 1  Abstraction (0/2) 0  Delayed Recall (0/5) 1  Orientation (0/6) 6  Total 21  Adjusted Score (based on education) 22       01/04/2024    4:00 PM 03/05/2023    5:08 PM 07/25/2022   11:58 AM  MMSE - Mini Mental State Exam  Orientation to time 5 5 5   Orientation to Place 5 5 5   Registration 3 2 3   Attention/ Calculation 5 5 5   Recall 3 3 3   Language- name 2 objects 2 2 2   Language- repeat 1 1 1   Language- follow 3 step command 3 3 3   Language- read & follow direction 1 1 1   Write a  sentence 1 1 1   Copy design 1 1 1   Total score 30 29 30        Movement examination: Tone: There is normal tone in the  UE/LE Abnormal movements:  no tremor.  No myoclonus.  No asterixis.   Coordination:  There is no decremation with RAM's. Normal finger to nose  Gait and Station: The patient has no difficulty arising out of a deep-seated chair without the use of the hands. The patient's stride length is good.  Gait is cautious and narrow.   Thank you for allowing us  the opportunity to participate in the care of this nice patient. Please do not hesitate to contact us  for any questions or concerns.   Total time spent on today's visit was 20 minutes dedicated to this patient today, preparing to see patient, examining the patient, ordering tests and/or medications and counseling the patient, documenting clinical information in the EHR or other health record, independently interpreting results and communicating results to the patient/family, discussing treatment and goals, answering patient's questions and coordinating care.  Cc:  Fleeta Valeria Mayo, MD  Camie Sevin 01/04/2024 4:35 PM

## 2024-01-28 ENCOUNTER — Other Ambulatory Visit: Payer: Self-pay | Admitting: Internal Medicine

## 2024-01-29 ENCOUNTER — Other Ambulatory Visit: Payer: Self-pay | Admitting: Internal Medicine

## 2024-01-29 MED ORDER — MEMANTINE HCL 10 MG PO TABS: ORAL_TABLET | ORAL | 1 refills | Status: DC

## 2024-02-05 ENCOUNTER — Other Ambulatory Visit: Payer: Self-pay | Admitting: Internal Medicine

## 2024-02-18 ENCOUNTER — Ambulatory Visit: Payer: Medicare HMO | Admitting: Physician Assistant

## 2024-02-26 ENCOUNTER — Other Ambulatory Visit: Payer: Self-pay | Admitting: Internal Medicine

## 2024-02-29 ENCOUNTER — Encounter: Payer: Self-pay | Admitting: Internal Medicine

## 2024-02-29 ENCOUNTER — Other Ambulatory Visit: Payer: Self-pay | Admitting: Family Medicine

## 2024-02-29 ENCOUNTER — Ambulatory Visit: Admitting: Internal Medicine

## 2024-02-29 VITALS — BP 178/80 | HR 55 | Temp 97.8°F | Resp 16 | Ht 60.0 in | Wt 135.4 lb

## 2024-02-29 DIAGNOSIS — F02A18 Dementia in other diseases classified elsewhere, mild, with other behavioral disturbance: Secondary | ICD-10-CM | POA: Diagnosis not present

## 2024-02-29 DIAGNOSIS — J4540 Moderate persistent asthma, uncomplicated: Secondary | ICD-10-CM

## 2024-02-29 DIAGNOSIS — G301 Alzheimer's disease with late onset: Secondary | ICD-10-CM | POA: Diagnosis not present

## 2024-02-29 MED ORDER — DONEPEZIL HCL 10 MG PO TABS: 10.0000 mg | ORAL_TABLET | Freq: Every day | ORAL | 3 refills | Status: AC

## 2024-02-29 MED ORDER — SERTRALINE HCL 25 MG PO TABS: 25.0000 mg | ORAL_TABLET | Freq: Every day | ORAL | 3 refills | Status: AC

## 2024-02-29 MED ORDER — MIRABEGRON ER 25 MG PO TB24: 25.0000 mg | ORAL_TABLET | Freq: Every day | ORAL | 3 refills | Status: AC

## 2024-02-29 MED ORDER — MEMANTINE HCL 10 MG PO TABS: 10.0000 mg | ORAL_TABLET | Freq: Two times a day (BID) | ORAL | 3 refills | Status: AC

## 2024-02-29 NOTE — Assessment & Plan Note (Signed)
 She seems to be stable.  We will continue on her zoloft .  Her daughter does not think she needs to go up on the dose.  There has been no change to her demtnia over the last 3 months.  We will continue on aricept  and namenda  at this time.

## 2024-02-29 NOTE — Progress Notes (Signed)
 Office Visit  Subjective   Patient ID: Shannon Martin   DOB: [birth year]   Age: 83 y.o.   MRN: [id number]   Chief Complaint Chief Complaint  Patient presents with   Follow-up    3 month follow up     History of Present Illness Shannon Martin is a 83 yo female who comes in today for followup of her memory loss.  I saw her 3 months ago where her daughter noted that she was more withdrawn where she would eat and then go back to her room.  We did start her on sertraline  25mg  daily and her daughter thinks this has helped with her mood disorder.  She is now staying out and interacting more with the family instead of retreating back into her room.   Again, she was admitted to [facility] from 07/11/2023 until 07/12/2023.  At that time, she was sitting on her couch and became unresponsive and difficult to wake.  In the ER, her workup was unremarkable with labs and EKG normal.  A CT of the head was negative.  Teleneurology was consulted and recommended a MRI of the brain and EEG.  A MRI of the head was done on 07/11/2023 which showed bilateral senescent small vessel ischemic changes.  Her EEG done on the same day showed no clear epileptiform activity but there was slow transients noted and slight focal neurophysiological disturbance in her the temporal regions bilaterally.  An ECHO was done on 07/11/2023 showed a LVEF 55-60% and she had impairment of her LV diastolic function.  There was trace aortic regurgitation.  She has a history of underlying cognitive dysfunction and the hospitalists were not sure what the etiology of her unresponsive episode.  She does have a neuropsychology testing done which was done last week on 07/20/2023.  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 Her testing showed a pattern of performance suggestive of a relative weakness across semantic fluency and further performance variability across visuospatial abilities and verbal learning and memory. No  cognitive domain exhibited consistent impairment. Performances were appropriate relative to age-matched peers across processing speed, attention/concentration, executive functioning, receptive language, phonemic fluency, confrontation naming, and visual memory. Functionally, [name] no longer drives and her family has taken over medication management, financial management, and bill paying responsibilities. Despite concerns surrounding functional impairment, [name] exhibits a relatively benign cognitive profile, certainly not suggestive of dementia level impairment. 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  They did biomarks with Tau and betamyloid testing and these were indicative of alzheimer's dementia.  They recommended to follow up in 6 months and to continue donepezil  10mg  daily and repeat another neuropsychiatric evaluation in 18-24 months.  They also recommend to control mood.  Her daughter first noted that the patient began having some mild memory problems about 2 years ago.  They noticed that she had problems remembering how to drive to certain places.  She did have an accidental fall in 06/2022 where she had a left femoral hip fracture where she was hospitalized and had a THA done at that time.  The patient was sent to [facility] for short term rehab and  they noted that after this event her memory was worsened.  The patient was independently living by herself before then but after [facility] she went to live with her daughter as she could not be left alone.  She became confused during a vacation in 09/2022 where she woke up and did not know where she was  at and called 911.  There has been some sundowning where if she falls asleep and wakes up later at night it takes time to get her bearings or she wakes up and says something nonsensical.  She has problems with short term memories but not long term memories.  She still recognizes everyone but cannot put the right name with the right person.  The daughter states that [name] keeps to herself and reads on her own.  There has been no behavioral problems and there may be some anxiety but they are not sure about depression.  There is no crying episodes.  The patient states she gets up 3-4 times a night to get up to use the bathroom.  She was assessed by her previous primary care physician in 07/2022 where they did blood work and a MMSE.  Her labs were unremarkable and her daughter states she did well with her MMSE.  They did a MRI of her brain on 08/07/2022 showed age congruent brain MRI. No specific or reversible cause for symptoms.   She otherwise does her other ADL's.  I saw her 3 months ago where we repeated her MMSE and she scored a 30/30 but again on her neuropsychiatric testing, she has cognitive issues.  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  She remains on aricept  10mg  daily and namenda  to 10mg  BID and she is not having any side effects from this medication.         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   GERD (gastroesophageal reflux disease)    Hip fracture 06/30/2022   IBS (irritable bowel syndrome) 03/22/2022   Iliotibial band syndrome of left side 06/17/2015   Internal hemorrhoid, bleeding 05/24/2022    Laryngopharyngeal reflux (LPR) 03/24/2015   Left knee pain 10/01/2014   Migraine without aura and without status migrainosus, not intractable 07/28/2019   Mild cognitive impairment of uncertain or unknown etiology 07/20/2023   Mixed hyperlipidemia 12/11/2018   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   Overactive bladder    Takes Vesicare   PAF (paroxysmal atrial fibrillation) 05/04/2021   Primary osteoarthritis of left knee 12/03/2014   Proctitis 02/15/2022   S/p left hip fracture 07/29/2022   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   Seborrheic dermatitis 12/21/2020   Stage 3a chronic kidney disease 06/08/2023   Urinary retention with incomplete bladder emptying 06/30/2022   Urinary tract infection without hematuria 07/09/2023     Allergies Allergies  Allergen Reactions   Tetanus Toxoid-Containing Vaccines Swelling and Other (See Comments)    Swelling around throat and jaws   Bee Venom Swelling and Other (See Comments)    Crusty area on skin   Hornet Venom Swelling    Crusty area on skin   Dilaudid  [Hydromorphone ] Nausea And Vomiting    Causes nausea and vomiting   Erythromycin Nausea  And Vomiting   Fiorinal [Butalbital-Aspirin -Caffeine]     Altered mental status   Amoxicillin Other (See Comments)    dizzy   Doxycycline Other (See Comments)    dizzy   Latex Other (See Comments)    Skin will crack open and bleed   Neomycin Other (See Comments)    Irritates skin   Penicillins Itching, Swelling and Rash     Medications  Current Outpatient Medications:    atorvastatin  (LIPITOR) 40 MG tablet, Take 1 tablet (40 mg total) by mouth daily., Disp: 90 tablet, Rfl: 3   B Complex-C-Calcium  (B-COMPLEX/VITAMIN C, W/ CA, PO), Take 1 capsule by mouth daily., Disp: , Rfl:    donepezil  (ARICEPT ) 10 MG tablet, TAKE 1 TABLET(10 MG) BY MOUTH AT BEDTIME, Disp: 30 tablet, Rfl: 5   ELIQUIS  5 MG TABS tablet, TAKE 1 TABLET(5 MG) BY MOUTH TWICE DAILY, Disp: 180 tablet, Rfl: 1   famotidine   (PEPCID ) 40 MG tablet, TAKE 1 TABLET(40 MG) BY MOUTH TWICE DAILY, Disp: 180 tablet, Rfl: 0   Hypromellose (ARTIFICIAL TEARS OP), Apply 1 drop to eye as needed for dry eyes., Disp: , Rfl:    loratadine  (CLARITIN ) 10 MG tablet, Take 10 mg by mouth daily., Disp: , Rfl:    memantine  (NAMENDA ) 10 MG tablet, TAKE 1 TABLET(10 MG) BY MOUTH TWICE DAILY, Disp: 60 tablet, Rfl: 1   Menaquinone-7 (VITAMIN K2 PO), Take by mouth., Disp: , Rfl:    Multiple Vitamins-Minerals (MULTIVITAMIN PO), Take 1 tablet by mouth daily., Disp: , Rfl:    MYRBETRIQ  25 MG TB24 tablet, TAKE 1 TABLET(25 MG) BY MOUTH DAILY, Disp: 30 tablet, Rfl: 2   PROLIA 60 MG/ML SOSY injection, Inject 60 mg into the skin every 6 (six) months., Disp: , Rfl:    sertraline  (ZOLOFT ) 25 MG tablet, TAKE 1 TABLET(25 MG) BY MOUTH DAILY, Disp: 30 tablet, Rfl: 2   tamsulosin  (FLOMAX ) 0.4 MG CAPS capsule, Take 0.4 mg by mouth daily., Disp: , Rfl:    Review of Systems Review of Systems  Constitutional:  Negative for chills and fever.  Eyes:  Negative for blurred vision.  Respiratory:  Negative for shortness of breath.   Cardiovascular:  Positive for palpitations. Negative for chest pain.  Gastrointestinal:  Negative for abdominal pain, constipation, diarrhea, nausea and vomiting.  Genitourinary:  Negative for dysuria, frequency and urgency.  Neurological:  Negative for dizziness, weakness and headaches.  Endo/Heme/Allergies:  Negative for polydipsia.       Objective:    Vitals BP (!) 178/80   Pulse (!) 55   Temp 97.8 F (36.6 C) (Temporal)   Resp 16   Ht 5' (1.524 m)   Wt 135 lb 6.4 oz (61.4 kg)   SpO2 97%   BMI 26.44 kg/m    Physical Examination Physical Exam Constitutional:      Appearance: Normal appearance. She is not ill-appearing.  Cardiovascular:     Rate and Rhythm: Normal rate and regular rhythm.     Pulses: Normal pulses.     Heart sounds: No murmur heard.    No friction rub. No gallop.  Pulmonary:     Effort: Pulmonary  effort is normal. No respiratory distress.     Breath sounds: No wheezing, rhonchi or rales.  Abdominal:     General: Bowel sounds are normal. There is no distension.     Palpations: Abdomen is soft.     Tenderness: There is no abdominal tenderness.  Musculoskeletal:     Right lower leg: No  edema.     Left lower leg: No edema.  Skin:    General: Skin is warm and dry.     Findings: No rash.  Neurological:     Mental Status: She is alert.  Psychiatric:        Mood and Affect: Mood normal.        Behavior: Behavior normal.        Assessment & Plan:   DAT (dementia of Alzheimer type) (HCC) She seems to be stable.  We will continue on her zoloft .  Her daughter does not think she needs to go up on the dose.  There has been no change to her demtnia over the last 3 months.  We will continue on aricept  and namenda  at this time.    No follow-ups on file.   Selinda Fleeta Finger, MD

## 2024-03-02 ENCOUNTER — Other Ambulatory Visit: Payer: Self-pay | Admitting: Internal Medicine

## 2024-03-02 DIAGNOSIS — J3089 Other allergic rhinitis: Secondary | ICD-10-CM

## 2024-03-25 ENCOUNTER — Other Ambulatory Visit: Payer: Self-pay | Admitting: Internal Medicine

## 2024-04-09 ENCOUNTER — Institutional Professional Consult (permissible substitution): Payer: Medicare HMO | Admitting: Psychology

## 2024-04-09 ENCOUNTER — Ambulatory Visit: Payer: Self-pay

## 2024-04-16 ENCOUNTER — Encounter: Payer: Medicare HMO | Admitting: Psychology

## 2024-04-30 ENCOUNTER — Other Ambulatory Visit: Payer: Self-pay | Admitting: Cardiology

## 2024-04-30 DIAGNOSIS — I480 Paroxysmal atrial fibrillation: Secondary | ICD-10-CM

## 2024-04-30 NOTE — Telephone Encounter (Signed)
 Prescription refill request for Eliquis  received. Indication:afib Last office visit:6/25 Scr:0.95  6/25 Age: 83 Weight:61.4  kg  Prescription refilled

## 2024-05-30 ENCOUNTER — Ambulatory Visit: Admitting: Internal Medicine

## 2024-05-30 ENCOUNTER — Encounter: Payer: Self-pay | Admitting: Internal Medicine

## 2024-05-30 VITALS — BP 184/72 | HR 72 | Temp 98.2°F | Resp 16 | Ht 61.0 in | Wt 137.4 lb

## 2024-05-30 DIAGNOSIS — N390 Urinary tract infection, site not specified: Secondary | ICD-10-CM

## 2024-05-30 DIAGNOSIS — G301 Alzheimer's disease with late onset: Secondary | ICD-10-CM

## 2024-05-30 DIAGNOSIS — R350 Frequency of micturition: Secondary | ICD-10-CM

## 2024-05-30 DIAGNOSIS — R319 Hematuria, unspecified: Secondary | ICD-10-CM | POA: Diagnosis not present

## 2024-05-30 LAB — POCT URINALYSIS DIPSTICK
Bilirubin, UA: NEGATIVE
Glucose, UA: NEGATIVE
Ketones, UA: NEGATIVE
Nitrite, UA: POSITIVE
Protein, UA: POSITIVE — AB
Spec Grav, UA: 1.025 (ref 1.010–1.025)
Urobilinogen, UA: 0.2 U/dL
pH, UA: 6 (ref 5.0–8.0)

## 2024-05-30 MED ORDER — CIPROFLOXACIN HCL 500 MG PO TABS: 500.0000 mg | ORAL_TABLET | Freq: Two times a day (BID) | ORAL | 0 refills | Status: AC

## 2024-05-30 NOTE — Addendum Note (Signed)
 Addended by: LENETTA LACKS on: 05/30/2024 05:25 PM   Modules accepted: Orders

## 2024-05-30 NOTE — Assessment & Plan Note (Signed)
 She has mild dementia and goes back to see neurology in 06/2023.  She is stable at this time and we will continue on her aricept  and namenda .  We will see her back in 3 months for an annual wellness exam and repeat her MMSE (remember this was normal before and we sent to neuropsychiatric testing).  Her weight is doing well and her mood looks stable.  We will continue with her current care.

## 2024-05-30 NOTE — Progress Notes (Signed)
 Office Visit  Subjective   Patient ID: Shannon Martin   DOB: [birth year]   Age: 83 y.o.   MRN: [id number]   Chief Complaint Chief Complaint  Patient presents with   Follow-up    3 month follow up.      History of Present Illness Shannon Martin is a 83 yo female who comes in today for followup of her memory loss.  She is accompanied by her daughter who states she has noticed she is not coming out of her room as much.   She states she has some depression but this has not worsened.  I saw her 6 months ago where her daughter noted that she was more withdrawn where she would eat and then go back to her room.  We did start her on sertraline  25mg  daily and her daughter thinks this has helped with her mood disorder and they do not think we need to go up on the dose of this today.  Again, she was admitted to [facility] from 07/11/2023 until 07/12/2023.  At that time, she was sitting on her couch and became unresponsive and difficult to wake.  In the ER, her workup was unremarkable with labs and EKG normal.  A CT of the head was negative.  Teleneurology was consulted and recommended a MRI of the brain and EEG.  A MRI of the head was done on 07/11/2023 which showed bilateral senescent small vessel ischemic changes.  Her EEG done on the same day showed no clear epileptiform activity but there was slow transients noted and slight focal neurophysiological disturbance in her the temporal regions bilaterally.  An ECHO was done on 07/11/2023 showed a LVEF 55-60% and she had impairment of her LV diastolic function.  There was trace aortic regurgitation.  She has a history of underlying cognitive dysfunction and the hospitalists were not sure what the etiology of her unresponsive episode.  She does have a neuropsychology testing done which was done last week on 07/20/2023.  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 Her testing showed a pattern of performance suggestive of a relative  weakness across semantic fluency and further performance variability across visuospatial abilities and verbal learning and memory. No cognitive domain exhibited consistent impairment. Performances were appropriate relative to age-matched peers across processing speed, attention/concentration, executive functioning, receptive language, phonemic fluency, confrontation naming, and visual memory. Functionally, Shannon Martin no longer drives and her family has taken over medication management, financial management, and bill paying responsibilities. Despite concerns surrounding functional impairment, Shannon Martin exhibits a relatively benign cognitive profile, certainly not suggestive of dementia level impairment. 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  They did biomarks with Tau and betamyloid testing and these were indicative of alzheimer's dementia.  They recommended to follow up in 6 months and to continue donepezil  10mg  daily and repeat another neuropsychiatric evaluation in 18-24 months.  They also recommend to control mood.  Her daughter first noted that the patient began having some mild memory problems about 2 years ago.  They noticed that she had problems remembering how to drive to certain places.  She did have an accidental fall in 06/2022  where she had a left femoral hip fracture where she was hospitalized and had a THA done at that time.  The patient was sent to [facility] for short term rehab and they noted that after this event her memory was worsened.  The patient was independently living by herself before then but after [facility] she went to live with her  daughter as she could not be left alone.  She became confused during a vacation in 09/2022 where she woke up and did not know where she was at and called 911.  There has been some sundowning where if she falls asleep and wakes up later at night it takes time to get her bearings or she wakes up and says something nonsensical.  She has problems with short term memories but not long term memories.  She still recognizes everyone but cannot put the right name with the right person.  The daughter states that Shannon Martin keeps to herself and reads on her own.  There has been no behavioral problems and there may be some anxiety but they are not sure about depression.  There is no crying episodes.  The patient states she gets up 3-4 times a night to get up to use the bathroom.  She was assessed by her previous primary care physician in 07/2022 where they did blood work and a MMSE.  Her labs were unremarkable and her daughter states she did well with her MMSE.  They did a MRI of her brain on 08/07/2022 showed age congruent brain MRI. No specific or reversible cause for symptoms.   She otherwise does her other ADL's. Earlier this year, we repeated her MMSE and she scored a 30/30 but again on her neuropsychiatric testing, she has cognitive issues.  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  She remains on aricept  10mg  daily and namenda  to 10mg  BID and she is not having any side effects from this medication.  She last saw neurology on 12/2023 and they wanted her to followup in 6 months and continue her medsd and repeat neuropsychaitric  testing in 12 to 18 months for diagnostic clarity and disease trajectory.       Past Medical History Past Medical History:  Diagnosis Date   Age-related osteoporosis without current pathological fracture 10/02/2022   Allergic rhinitis 02/16/2015   Bilateral carotid bruits 03/09/2023   Elevated glucose level 10/02/2022    Fecal soiling 05/16/2022   Generalized anxiety disorder 05/16/2022   Generalized osteoarthritis 07/28/2019   GERD (gastroesophageal reflux disease)    Hip fracture 06/30/2022   IBS (irritable bowel syndrome) 03/22/2022   Iliotibial band syndrome of left side 06/17/2015   Internal hemorrhoid, bleeding 05/24/2022   Laryngopharyngeal reflux (LPR) 03/24/2015   Left knee pain 10/01/2014   Migraine without aura and without status migrainosus, not intractable 07/28/2019   Mild cognitive impairment of uncertain or unknown etiology 07/20/2023   Mixed hyperlipidemia 12/11/2018   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   Overactive bladder    Takes Vesicare   PAF (paroxysmal atrial fibrillation) 05/04/2021   Primary osteoarthritis of left knee 12/03/2014   Proctitis 02/15/2022   S/p left hip fracture 07/29/2022   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   Seborrheic dermatitis 12/21/2020   Stage 3a chronic kidney disease 06/08/2023   Urinary retention with incomplete bladder emptying 06/30/2022   Urinary tract infection without hematuria 07/09/2023     Allergies Allergies  Allergen Reactions   Tetanus  Toxoid-Containing Vaccines Swelling and Other (See Comments)    Swelling around throat and jaws   Bee Venom Swelling and Other (See Comments)    Crusty area on skin   Hornet Venom Swelling    Crusty area on skin   Dilaudid  [Hydromorphone ] Nausea And Vomiting    Causes nausea and vomiting   Erythromycin Nausea And Vomiting   Fiorinal [Butalbital-Aspirin -Caffeine]     Altered mental status   Amoxicillin Other (See Comments)    dizzy   Doxycycline Other (See Comments)    dizzy   Latex Other (See Comments)    Skin will crack open and bleed   Neomycin Other (See Comments)    Irritates skin   Penicillins Itching, Swelling and Rash     Medications  Current Outpatient Medications:    atorvastatin  (LIPITOR) 40 MG tablet, Take 1 tablet (40 mg total) by mouth daily., Disp: 90 tablet,  Rfl: 3   donepezil  (ARICEPT ) 10 MG tablet, Take 1 tablet (10 mg total) by mouth at bedtime., Disp: 90 tablet, Rfl: 3   ELIQUIS  5 MG TABS tablet, TAKE 1 TABLET(5 MG) BY MOUTH TWICE DAILY, Disp: 180 tablet, Rfl: 1   famotidine  (PEPCID ) 40 MG tablet, TAKE 1 TABLET(40 MG) BY MOUTH TWICE DAILY, Disp: 180 tablet, Rfl: 0   fluticasone  (FLONASE ) 50 MCG/ACT nasal spray, INSTILL 1 SPRAY INTO EACH NOSTRIL ONCE DAILY AS DIRECTED., Disp: 48 g, Rfl: 1   Hypromellose (ARTIFICIAL TEARS OP), Apply 1 drop to eye as needed for dry eyes., Disp: , Rfl:    loratadine  (CLARITIN ) 10 MG tablet, Take 10 mg by mouth daily., Disp: , Rfl:    memantine  (NAMENDA ) 10 MG tablet, Take 1 tablet (10 mg total) by mouth 2 (two) times daily., Disp: 180 tablet, Rfl: 3   Menaquinone-7 (VITAMIN K2 PO), Take by mouth., Disp: , Rfl:    mirabegron  ER (MYRBETRIQ ) 25 MG TB24 tablet, Take 1 tablet (25 mg total) by mouth daily., Disp: 90 tablet, Rfl: 3   Multiple Vitamins-Minerals (MULTIVITAMIN PO), Take 1 tablet by mouth daily., Disp: , Rfl:    PROLIA 60 MG/ML SOSY injection, Inject 60 mg into the skin every 6 (six) months., Disp: , Rfl:    sertraline  (ZOLOFT ) 25 MG tablet, Take 1 tablet (25 mg total) by mouth daily., Disp: 90 tablet, Rfl: 3   tamsulosin  (FLOMAX ) 0.4 MG CAPS capsule, Take 0.4 mg by mouth daily., Disp: , Rfl:    Review of Systems Review of Systems  Constitutional:  Negative for chills and fever.  Eyes:  Negative for blurred vision and double vision.  Respiratory:  Negative for shortness of breath.   Cardiovascular:  Negative for chest pain and palpitations.  Gastrointestinal:  Negative for abdominal pain, constipation, diarrhea, nausea and vomiting.  Genitourinary:  Negative for dysuria and frequency.  Neurological:  Negative for dizziness, weakness and headaches.       Objective:    Vitals BP (!) 184/72   Pulse 72   Temp 98.2 F (36.8 C) (Temporal)   Resp 16   Ht 5' 1 (1.549 m)   Wt 137 lb 6.4 oz (62.3 kg)    SpO2 97%   BMI 25.96 kg/m    Physical Examination Physical Exam Constitutional:      Appearance: Normal appearance. She is not ill-appearing.  Cardiovascular:     Rate and Rhythm: Normal rate and regular rhythm.     Pulses: Normal pulses.     Heart sounds: No murmur heard.    No  friction rub. No gallop.  Pulmonary:     Effort: Pulmonary effort is normal. No respiratory distress.     Breath sounds: No wheezing, rhonchi or rales.  Abdominal:     General: Bowel sounds are normal. There is no distension.     Palpations: Abdomen is soft.     Tenderness: There is no abdominal tenderness.  Musculoskeletal:     Right lower leg: No edema.     Left lower leg: No edema.  Skin:    General: Skin is warm and dry.     Findings: No rash.  Neurological:     General: No focal deficit present.     Mental Status: She is alert and oriented to person, place, and time.  Psychiatric:        Mood and Affect: Mood normal.        Behavior: Behavior normal.        Assessment & Plan:   DAT (dementia of Alzheimer type) (HCC) She has mild dementia and goes back to see neurology in 06/2023.  She is stable at this time and we will continue on her aricept  and namenda .  We will see her back in 3 months for an annual wellness exam and repeat her MMSE (remember this was normal before and we sent to neuropsychiatric testing).  Her weight is doing well and her mood looks stable.  We will continue with her current care.    Return in about 3 months (around 08/28/2024) for annual.   Ariez Neilan Van Eyk, MD

## 2024-05-30 NOTE — Addendum Note (Signed)
 Addended by: LENETTA LACKS on: 05/30/2024 05:13 PM   Modules accepted: Orders

## 2024-06-03 LAB — URINE CULTURE

## 2024-06-11 ENCOUNTER — Other Ambulatory Visit: Payer: Self-pay | Admitting: Internal Medicine

## 2024-06-12 NOTE — Progress Notes (Signed)
 Patient called.  Left message for patient to call back.  Per Dr. Fleeta Finger Did we send in an antibiotic for mrs. Shannon Martin?  If not, send her in augmentin 875mg  po BID x 7 days. SABRA

## 2024-07-01 ENCOUNTER — Other Ambulatory Visit: Payer: Self-pay

## 2024-07-01 MED ORDER — FAMOTIDINE 40 MG PO TABS: ORAL_TABLET | ORAL | 0 refills | Status: AC

## 2024-07-01 NOTE — Progress Notes (Signed)
 Refilled Famotidine  to [facility] in [place]

## 2024-07-07 NOTE — Progress Notes (Signed)
 "   Mild Cognitive Impairment likely due to Alzheimer disease    Shannon Martin is a delightful 84 y.o. RH female with a history of hypertension, hyperlipidemia, PAF on AC and initial diagnosis of mild cognitive impairment of unclear etiology with concerns for Alzheimer's disease seen today in follow up for memory loss. Patient is currently on donepezil  10 mg daily and memantine  10 mg twice daily.   Patient was last seen on 01/04/2024 . Memory is stable. MMSE today is  30/30. Patient is able to participate on ADLs but has not been participating in social activities leading to increased isolation.  This patient is accompanied in the office by her daughter  who supplements the history.  Previous records as well as any outside records available were reviewed prior to todays visit.   Follow up in 6 months Continue donepezil  10 mg daily and memantine  10 mg twice daily, side effects discussed Recommend good control of cardiovascular risk factors.  She is on Eliquis  Continue to control mood as per PCP Increase socialization, i[facility]e. [facility]   Discussed the use of AI scribe software for clinical note transcription with the patient, who gave verbal consent to proceed.  History of Present Illness Shannon Martin is an 84 year old female who presents for follow-up on memory loss. She is accompanied by her daughter, who is her primary caregiver.  Her short-term memory issues remain unchanged since her last visit on July 11. She continues to have difficulty with new information, recent conversations, and remembering names of new acquaintances. Long-term memory is reportedly fair. She occasionally asks repetitive questions, such as about appointment times, and sometimes misplaces items like car keys, which have been missing for two years. She acknowledges occasional confabulation, where she may tell stories that are not entirely accurate. Her interest in reading has diminished, and she no longer plays  solitaire. She does not engage much in household activities and prefers to stay in her room until later in the evening, often waiting for her daughter to return home before eating dinner.  She experiences some anxiety about being alone at night, leading her to check doors multiple times before sleeping. This behavior has not changed since the last visit. No hallucinations, mood changes, or depression, although she is on an antidepressant. She sleeps well without night terrors or vivid dreams.  She has a history of migraines, which occur occasionally in the morning but usually subside after eating. No recent vision changes or chronic pain requiring medication. She feels a little unsteady while walking but manages to get around. Her sense of smell has been diminished for about three to four years.  She has a history of overactive bladder and recurrent urinary tract infections, with the last infection occurring around May 29, 2024. No chronic diarrhea or constipation. She lives with her daughter and no longer drives after getting lost previously. She has atrial fibrillation and takes a blood thinner for this condition.  Her current medications include donepezil  10 mg twice daily and memantine , which she continues to take as prescribed. She also takes an antidepressant.       Neuropsych evaluation 07/20/2023 briefly, results suggested a relative weakness across semantic fluency and further performance variability across visuospatial abilities and verbal learning and memory. No cognitive domain exhibited consistent impairment. Neurologically speaking, Shannon Martin's current pattern is nonspecific in nature and does not suggest any particular illness in a compelling fashion. Concerns were expressed surrounding underlying Lewy body disease. Indeed, Shannon Martin does  report experiences concerning for REM sleep behaviors and visual hallucinations. Some of what her daughter has observed behaviorally could also  reflect fluctuations in alertness common in this illness. However, testing does not currently align in a particularly compelling manner as executive functioning was appropriate and visuospatial abilities were variable but often intact. Concerns were also expressed for Alzheimer's disease. Across memory testing, Shannon Martin did have greater difficulty learning a novel list of words relative to story content. After a brief delay, she was amnestic towards this list (i.e., 0% retention). However, she responded well to cueing with a perfect 20/20 performance across a recognition trial. Performances were likewise appropriate across story and figure-based tasks. Speaking in a very broad sense, difficulties with learning and memory, semantic fluency, and visuospatial abilities do align fairly well with a typical Alzheimer's disease progression. However, there is not compelling evidence to suggest prominent memory impairment at the present time or memory dysfunction in a pattern which would strongly point to Alzheimer's disease as the underlying cause.   Initial visit 05/07/2023    How long did patient have memory difficulties?  For about 3 years.  Little things, especially when the material not important. Patient reports more difficulty remembering new information, recent conversations, names. There has been confabulation for at least 3-4 years according to daughter. LTM is great per daughter's report. She reads voraciously, and likes to play solitaire.  She spends a lot of time in her room .  repeats oneself?  Endorsed, especially with appointments  Disoriented when walking into a room?  Patient denies    Leaving objects in unusual places?  She may misplace the phone.     Wandering behavior? denies. Any personality changes, or depression, anxiety?  This is to be more pronounced, she was afraid to live alone, for which her daughter moved her with her.  She reports since then I am doing better.    Hallucinations or paranoia? Denies although her daughter says she said she was being chased by giant spiders (May 2024) . There have been other incidents. She is afraid to be left alone, especially at night, she checks several times before going to sleep.  One time, she went to her daughters bedroom,, back to her daughter's close, to get back to her room, [place].  Once they came, she did not know if those 3 people outside were alive or dead .  They questioned the daughter because she stated that they were keeping her hostage, a whole mess.   Seizures? denies .   Any sleep changes?  Overall she sleeps well however occasionally she may have night terrors and when she wakes up she may feel disoriented.  Patient reports that she has been dong that since being a child.  Daughter reports that she has vivid dreams, + REM behavior which shadowboxing,  denies sleepwalking.  Sleep apnea? Denies.   Any hygiene concerns? Endorsed since at least January, we are lucky if she does every 2 weeks Independent of bathing and dressing? Endorsed  Does the patient need help with medications? Patient is in charge, daughter monitors.  She has a calendar to check off to make sure she does it     Who is in charge of the finances? Daughter  is in charge    Any changes in appetite?   Denies. Daughter thinks she does not eat when no one is in the house   Patient have trouble swallowing?  Denies.   Does the  patient cook? No. She moved to her daughter house and she took away the handle leaving it at the burner, the fire department came noticing it with as well as an empty teapot on the stove.   Any headaches? She has a history of migraines not recently    Chronic pain? Denies.   Ambulates with difficulty? Denies  Recent falls or head injuries? 3 years ago and hit the L forehead, no LOC.  One fall on the parking lot while walking the door 6 y ago   Vision changes?  Denies Any strokelike symptoms?  Denies.   Any tremors? Denies  Any anosmia? Endorsed over the last 3 years  Any incontinence of urine? She has a history of OAB  sees urology.   Any bowel dysfunction? Off and on.     Patient lives with her daughter since Jan 2024     History of heavy alcohol  intake? Denies.   History of heavy tobacco use? Denies.   Family history of dementia?  Father had AD. PGM dementia ?type.  Does patient drive?   No longer drives. Daughter hid the car keys because she was getting lost and not driving well.   MRI of the brain 08/10/2022, personally reviewed without acute intracranial abnormalities, age congruent.      07/08/2024    5:00 PM 01/04/2024    4:00 PM 03/05/2023    5:08 PM  MMSE - Mini Mental State Exam  Orientation to time 5 5 5   Orientation to Place 5 5 5   Registration 3 3 2   Attention/ Calculation 5 5 5   Recall 3 3 3   Language- name 2 objects 2 2 2   Language- repeat 1 1 1   Language- follow 3 step command 3 3 3   Language- read & follow direction 1 1 1   Write a sentence 1 1 1   Copy design 1 1 1   Total score 30 30 29       05/07/2023   11:00 AM  Montreal Cognitive Assessment   Visuospatial/ Executive (0/5) 3  Naming (0/3) 3  Attention: Read list of digits (0/2) 1  Attention: Read list of letters (0/1) 1  Attention: Serial 7 subtraction starting at 100 (0/3) 3  Language: Repeat phrase (0/2) 2  Language : Fluency (0/1) 1  Abstraction (0/2) 0  Delayed Recall (0/5) 1  Orientation (0/6) 6  Total 21  Adjusted Score (based on education) 22      Objective:    Neurological Exam:    VITALS:   Vitals:   07/08/24 1521 07/08/24 1558  BP: (!) 179/86 (!) 180/77  Pulse: 66   Resp: 20   SpO2: 98%   Weight: 141 lb (64 kg)     GEN:  The patient appears stated age and is in NAD. HEENT:  Normocephalic, atraumatic.   Neurological examination:  General: NAD, well-groomed, appears stated age. Orientation: The patient is alert. Oriented to person, place and  to date Cranial  nerves: There is good facial symmetry.The speech is fluent and clear. No aphasia or dysarthria. Fund of knowledge is appropriate. Recent and remote memory are impaired. Attention and concentration are norma;. Able to name objects and repeat phrases.  Hearing is intact to conversational tone.   Sensation: Sensation is intact to light touch throughout Motor: Strength is at least antigravity x4. DTR's 2/4 in UE/LE     Movement examination:  Tone: There is normal tone in the UE/LE Abnormal movements:  no tremor.  No myoclonus.  No asterixis.  Coordination:  There is no decremation with RAM's. Normal finger to nose  Gait and Station: The patient has no  difficulty arising out of a deep-seated chair without the use of the hands. The patient's stride length is good.  Gait is cautious and narrow.    Thank you for allowing us  the opportunity to participate in the care of this nice patient. Please do not hesitate to contact us  for any questions or concerns.   Total time spent on today's visit was 32 minutes dedicated to this patient today, preparing to see patient, examining the patient, ordering tests and/or medications and counseling the patient, documenting clinical information in the EHR or other health record, independently interpreting results and communicating results to the patient/family, discussing treatment and goals, answering patient's questions and coordinating care.  Cc:  Amin, Saad, MD  Camie Sevin 07/08/2024 5:52 PM      "

## 2024-07-08 ENCOUNTER — Ambulatory Visit: Admitting: Physician Assistant

## 2024-07-08 ENCOUNTER — Encounter: Payer: Self-pay | Admitting: Physician Assistant

## 2024-07-08 VITALS — BP 180/77 | HR 66 | Resp 20 | Wt 141.0 lb

## 2024-07-08 DIAGNOSIS — G3184 Mild cognitive impairment, so stated: Secondary | ICD-10-CM | POA: Diagnosis not present

## 2024-07-08 NOTE — Patient Instructions (Signed)
 It was a pleasure to see you today at our office.   Recommendations:   Follow up in 6 months  Continue donepezil  10 mg daily  and memantine  10 mg twice a day  Recommend visiting the website :  Dementia Success Path to better understand some behaviors related to memory loss.     https://[email or web address].org/resource/neuro-rehabilitation-apps-and-games/   RECOMMENDATIONS FOR ALL PATIENTS WITH MEMORY PROBLEMS: 1. Continue to exercise (Recommend 30 minutes of walking everyday, or 3 hours every week) 2. Increase social interactions - continue going to [place] and enjoy social gatherings with friends and family 3. Eat healthy, avoid fried foods and eat more fruits and vegetables 4. Maintain adequate blood pressure, blood sugar, and blood cholesterol level. Reducing the risk of stroke and cardiovascular disease also helps promoting better memory. 5. Avoid stressful situations. Live a simple life and avoid aggravations. Organize your time and prepare for the next day in anticipation. 6. Sleep well, avoid any interruptions of sleep and avoid any distractions in the bedroom that may interfere with adequate sleep quality 7. Avoid sugar, avoid sweets as there is a strong link between excessive sugar intake, diabetes, and cognitive impairment We discussed the Mediterranean diet, which has been shown to help patients reduce the risk of progressive memory disorders and reduces cardiovascular risk. This includes eating fish, eat fruits and green leafy vegetables, nuts like almonds and hazelnuts, walnuts, and also use olive oil. Avoid fast foods and fried foods as much as possible. Avoid sweets and sugar as sugar use has been linked to worsening of memory function.  There is always a concern of gradual progression of memory problems. If this is the case, then we may need to adjust level of care according to patient needs. Support, both to the patient and caregiver, should then be put into place.      FALL  PRECAUTIONS: Be cautious when walking. Scan the area for obstacles that may increase the risk of trips and falls. When getting up in the mornings, sit up at the edge of the bed for a few minutes before getting out of bed. Consider elevating the bed at the head end to avoid drop of blood pressure when getting up. Walk always in a well-lit room (use night lights in the walls). Avoid area rugs or power cords from appliances in the middle of the walkways. Use a walker or a cane if necessary and consider physical therapy for balance exercise. Get your eyesight checked regularly.  FINANCIAL OVERSIGHT: Supervision, especially oversight when making financial decisions or transactions is also recommended.  HOME SAFETY: Consider the safety of the kitchen when operating appliances like stoves, microwave oven, and blender. Consider having supervision and share cooking responsibilities until no longer able to participate in those. Accidents with firearms and other hazards in the house should be identified and addressed as well.   ABILITY TO BE LEFT ALONE: If patient is unable to contact 911 operator, consider using LifeLine, or when the need is there, arrange for someone to stay with patients. Smoking is a fire hazard, consider supervision or cessation. Risk of wandering should be assessed by caregiver and if detected at any point, supervision and safe proof recommendations should be instituted.  MEDICATION SUPERVISION: Inability to self-administer medication needs to be constantly addressed. Implement a mechanism to ensure safe administration of the medications.      Mediterranean Diet A Mediterranean diet refers to food and lifestyle choices that are based on the traditions of countries located on the [employer].  This way of eating has been shown to help prevent certain conditions and improve outcomes for people who have chronic diseases, like kidney disease and heart disease. What are tips for  following this plan? Lifestyle  Cook and eat meals together with your family, when possible. Drink enough fluid to keep your urine clear or pale yellow. Be physically active every day. This includes: Aerobic exercise like running or swimming. Leisure activities like gardening, walking, or housework. Get 7-8 hours of sleep each night. If recommended by your health care provider, drink red wine in moderation. This means 1 glass a day for nonpregnant women and 2 glasses a day for men. A glass of wine equals 5 oz (150 mL). Reading food labels  Check the serving size of packaged foods. For foods such as rice and pasta, the serving size refers to the amount of cooked product, not dry. Check the total fat in packaged foods. Avoid foods that have saturated fat or trans fats. Check the ingredients list for added sugars, such as corn syrup. Shopping  At the grocery store, buy most of your food from the areas near the walls of the store. This includes: Fresh fruits and vegetables (produce). Grains, beans, nuts, and seeds. Some of these may be available in unpackaged forms or large amounts (in bulk). Fresh seafood. Poultry and eggs. Low-fat dairy products. Buy whole ingredients instead of prepackaged foods. Buy fresh fruits and vegetables in-season from local farmers markets. Buy frozen fruits and vegetables in resealable bags. If you do not have access to quality fresh seafood, buy precooked frozen shrimp or canned fish, such as tuna, salmon, or sardines. Buy small amounts of raw or cooked vegetables, salads, or olives from the deli or salad bar at your store. Stock your pantry so you always have certain foods on hand, such as olive oil, canned tuna, canned tomatoes, rice, pasta, and beans. Cooking  Cook foods with extra-virgin olive oil instead of using butter or other vegetable oils. Have meat as a side dish, and have vegetables or grains as your main dish. This means having meat in small portions  or adding small amounts of meat to foods like pasta or stew. Use beans or vegetables instead of meat in common dishes like chili or lasagna. Experiment with different cooking methods. Try roasting or broiling vegetables instead of steaming or sauteing them. Add frozen vegetables to soups, stews, pasta, or rice. Add nuts or seeds for added healthy fat at each meal. You can add these to yogurt, salads, or vegetable dishes. Marinate fish or vegetables using olive oil, lemon juice, garlic, and fresh herbs. Meal planning  Plan to eat 1 vegetarian meal one day each week. Try to work up to 2 vegetarian meals, if possible. Eat seafood 2 or more times a week. Have healthy snacks readily available, such as: Vegetable sticks with hummus. Greek yogurt. Fruit and nut trail mix. Eat balanced meals throughout the week. This includes: Fruit: 2-3 servings a day Vegetables: 4-5 servings a day Low-fat dairy: 2 servings a day Fish, poultry, or lean meat: 1 serving a day Beans and legumes: 2 or more servings a week Nuts and seeds: 1-2 servings a day Whole grains: 6-8 servings a day Extra-virgin olive oil: 3-4 servings a day Limit red meat and sweets to only a few servings a month What are my food choices? Mediterranean diet Recommended Grains: Whole-grain pasta. Brown rice. Bulgar wheat. Polenta. Couscous. Whole-wheat bread. [place] Made[place]. Vegetables: Artichokes. Beets. Broccoli. Cabbage. Carrots. Eggplant. [place]  beans. Chard. Kale. Spinach. Onions. Leeks. Peas. Squash. Tomatoes. Peppers. Radishes. Fruits: Apples. Apricots. Avocado. Berries. Bananas. Cherries. Dates. Figs. Grapes. Lemons. Melon. Oranges. Peaches. Plums. Pomegranate. Meats and other protein foods: Beans. Almonds. Sunflower seeds. Pine nuts. Peanuts. Cod. Salmon. Scallops. Shrimp. Tuna. Tilapia. Clams. Oysters. Eggs. Dairy: Low-fat milk. Cheese. Greek yogurt. Beverages: Water . Red wine. Herbal tea. Fats and oils: Extra virgin olive oil.  Avocado oil. Grape seed oil. Sweets and desserts: Greek yogurt with honey. Baked apples. Poached pears. Trail mix. Seasoning and other foods: Basil. Cilantro. Coriander. Cumin. Mint. Parsley. Sage. Rosemary. Tarragon. Garlic. Oregano. Thyme. Pepper. Balsalmic vinegar. Tahini. Hummus. Tomato sauce. Olives. Mushrooms. Limit these Grains: Prepackaged pasta or rice dishes. Prepackaged cereal with added sugar. Vegetables: Deep fried potatoes (french fries). Fruits: Fruit canned in syrup. Meats and other protein foods: Beef. Pork. Lamb. Poultry with skin. Hot dogs. [place]. Dairy: Ice cream. Sour cream. Whole milk. Beverages: Juice. Sugar-sweetened soft drinks. Beer. Liquor and spirits. Fats and oils: Butter. Canola oil. Vegetable oil. Beef fat (tallow). Lard. Sweets and desserts: Cookies. Cakes. Pies. Candy. Seasoning and other foods: Mayonnaise. Premade sauces and marinades. The items listed may not be a complete list. Talk with your dietitian about what dietary choices are right for you. Summary The Mediterranean diet includes both food and lifestyle choices. Eat a variety of fresh fruits and vegetables, beans, nuts, seeds, and whole grains. Limit the amount of red meat and sweets that you eat. Talk with your health care provider about whether it is safe for you to drink red wine in moderation. This means 1 glass a day for nonpregnant women and 2 glasses a day for men. A glass of wine equals 5 oz (150 mL). This information is not intended to replace advice given to you by your health care provider. Make sure you discuss any questions you have with your health care provider. Document Released: 02/03/2016 Document Revised: 03/07/2016 Document Reviewed: 02/03/2016 Elsevier Interactive Patient Education  2017 [employer].

## 2024-09-01 ENCOUNTER — Encounter: Admitting: Internal Medicine

## 2024-09-05 ENCOUNTER — Ambulatory Visit: Admitting: Cardiology

## 2025-01-09 ENCOUNTER — Ambulatory Visit: Payer: Self-pay | Admitting: Physician Assistant
